# Patient Record
Sex: Female | Born: 1955
Health system: Southern US, Community
[De-identification: ages and names within clinical notes are randomized; demographics above are authoritative.]

## PROBLEM LIST (undated history)

## (undated) DIAGNOSIS — E079 Disorder of thyroid, unspecified: Secondary | ICD-10-CM

## (undated) DIAGNOSIS — I499 Cardiac arrhythmia, unspecified: Secondary | ICD-10-CM

## (undated) DIAGNOSIS — K219 Gastro-esophageal reflux disease without esophagitis: Secondary | ICD-10-CM

## (undated) DIAGNOSIS — M1990 Unspecified osteoarthritis, unspecified site: Secondary | ICD-10-CM

## (undated) DIAGNOSIS — Z5189 Encounter for other specified aftercare: Secondary | ICD-10-CM

## (undated) DIAGNOSIS — R42 Dizziness and giddiness: Secondary | ICD-10-CM

## (undated) DIAGNOSIS — K589 Irritable bowel syndrome without diarrhea: Secondary | ICD-10-CM

## (undated) DIAGNOSIS — M549 Dorsalgia, unspecified: Secondary | ICD-10-CM

## (undated) DIAGNOSIS — K559 Vascular disorder of intestine, unspecified: Secondary | ICD-10-CM

## (undated) DIAGNOSIS — I2699 Other pulmonary embolism without acute cor pulmonale: Secondary | ICD-10-CM

## (undated) DIAGNOSIS — E119 Type 2 diabetes mellitus without complications: Secondary | ICD-10-CM

## (undated) DIAGNOSIS — I10 Essential (primary) hypertension: Secondary | ICD-10-CM

## (undated) DIAGNOSIS — I82409 Acute embolism and thrombosis of unspecified deep veins of unspecified lower extremity: Secondary | ICD-10-CM

## (undated) DIAGNOSIS — R55 Syncope and collapse: Secondary | ICD-10-CM

## (undated) DIAGNOSIS — T7840XA Allergy, unspecified, initial encounter: Secondary | ICD-10-CM

## (undated) DIAGNOSIS — K648 Other hemorrhoids: Secondary | ICD-10-CM

## (undated) DIAGNOSIS — Z8619 Personal history of other infectious and parasitic diseases: Secondary | ICD-10-CM

## (undated) DIAGNOSIS — R51 Headache: Secondary | ICD-10-CM

## (undated) DIAGNOSIS — IMO0001 Reserved for inherently not codable concepts without codable children: Secondary | ICD-10-CM

## (undated) DIAGNOSIS — R519 Headache, unspecified: Secondary | ICD-10-CM

## (undated) HISTORY — DX: Other hemorrhoids: K64.8

## (undated) HISTORY — PX: JOINT REPLACEMENT: SHX530

## (undated) HISTORY — DX: Personal history of other infectious and parasitic diseases: Z86.19

## (undated) HISTORY — DX: Cardiac arrhythmia, unspecified: I49.9

## (undated) HISTORY — DX: Reserved for inherently not codable concepts without codable children: IMO0001

## (undated) HISTORY — DX: Allergy, unspecified, initial encounter: T78.40XA

## (undated) HISTORY — DX: Essential (primary) hypertension: I10

## (undated) HISTORY — DX: Other pulmonary embolism without acute cor pulmonale: I26.99

## (undated) HISTORY — DX: Syncope and collapse: R55

## (undated) HISTORY — DX: Vascular disorder of intestine, unspecified: K55.9

## (undated) HISTORY — DX: Gastro-esophageal reflux disease without esophagitis: K21.9

## (undated) HISTORY — PX: ROTATOR CUFF REPAIR: SHX139

## (undated) HISTORY — DX: Encounter for other specified aftercare: Z51.89

## (undated) HISTORY — PX: HYSTEROSCOPY: SHX211

## (undated) HISTORY — DX: Dorsalgia, unspecified: M54.9

## (undated) HISTORY — PX: TUBAL LIGATION: SHX77

## (undated) HISTORY — DX: Disorder of thyroid, unspecified: E07.9

---

## 2004-02-14 ENCOUNTER — Ambulatory Visit: Payer: Self-pay | Admitting: Specialist

## 2004-03-19 ENCOUNTER — Ambulatory Visit: Payer: Self-pay | Admitting: Specialist

## 2004-07-20 ENCOUNTER — Ambulatory Visit: Payer: Self-pay | Admitting: Unknown Physician Specialty

## 2004-08-02 ENCOUNTER — Ambulatory Visit: Payer: Self-pay | Admitting: General Surgery

## 2004-08-16 ENCOUNTER — Ambulatory Visit: Payer: Self-pay | Admitting: Family Medicine

## 2005-02-15 ENCOUNTER — Ambulatory Visit: Payer: Self-pay | Admitting: Family Medicine

## 2005-06-07 ENCOUNTER — Ambulatory Visit: Payer: Self-pay | Admitting: Family Medicine

## 2005-06-22 ENCOUNTER — Ambulatory Visit: Payer: Self-pay | Admitting: Family Medicine

## 2005-06-29 ENCOUNTER — Ambulatory Visit: Payer: Self-pay | Admitting: Family Medicine

## 2005-09-21 ENCOUNTER — Ambulatory Visit: Payer: Self-pay | Admitting: Unknown Physician Specialty

## 2005-09-21 ENCOUNTER — Ambulatory Visit: Payer: Self-pay | Admitting: Specialist

## 2005-12-26 ENCOUNTER — Ambulatory Visit: Payer: Self-pay | Admitting: Anesthesiology

## 2006-01-23 ENCOUNTER — Ambulatory Visit: Payer: Self-pay | Admitting: Anesthesiology

## 2006-02-22 ENCOUNTER — Ambulatory Visit: Payer: Self-pay | Admitting: Neurosurgery

## 2006-03-21 ENCOUNTER — Ambulatory Visit: Payer: Self-pay | Admitting: Neurosurgery

## 2006-03-23 ENCOUNTER — Ambulatory Visit: Payer: Self-pay | Admitting: Anesthesiology

## 2006-04-26 ENCOUNTER — Ambulatory Visit: Payer: Self-pay | Admitting: Anesthesiology

## 2006-05-08 ENCOUNTER — Encounter: Payer: Self-pay | Admitting: Anesthesiology

## 2006-05-19 ENCOUNTER — Encounter: Payer: Self-pay | Admitting: Anesthesiology

## 2006-06-08 ENCOUNTER — Ambulatory Visit: Payer: Self-pay | Admitting: Family Medicine

## 2006-06-08 LAB — CONVERTED CEMR LAB
ALT: 15 units/L (ref 0–40)
AST: 14 units/L (ref 0–37)
Albumin: 3.7 g/dL (ref 3.5–5.2)
Alkaline Phosphatase: 68 units/L (ref 39–117)
BUN: 14 mg/dL (ref 6–23)
Basophils Absolute: 0 10*3/uL (ref 0.0–0.1)
Basophils Relative: 0.4 % (ref 0.0–1.0)
Bilirubin, Direct: 0.1 mg/dL (ref 0.0–0.3)
CO2: 32 meq/L (ref 19–32)
Calcium: 9.1 mg/dL (ref 8.4–10.5)
Chloride: 103 meq/L (ref 96–112)
Creatinine, Ser: 0.8 mg/dL (ref 0.4–1.2)
Eosinophils Absolute: 0.1 10*3/uL (ref 0.0–0.6)
Eosinophils Relative: 0.8 % (ref 0.0–5.0)
GFR calc Af Amer: 97 mL/min
GFR calc non Af Amer: 80 mL/min
Glucose, Bld: 104 mg/dL — ABNORMAL HIGH (ref 70–99)
HCT: 41.1 % (ref 36.0–46.0)
Hemoglobin: 13.8 g/dL (ref 12.0–15.0)
Lymphocytes Relative: 24.7 % (ref 12.0–46.0)
MCHC: 33.5 g/dL (ref 30.0–36.0)
MCV: 84.9 fL (ref 78.0–100.0)
Magnesium: 2.2 mg/dL (ref 1.5–2.5)
Monocytes Absolute: 0.5 10*3/uL (ref 0.2–0.7)
Monocytes Relative: 6.6 % (ref 3.0–11.0)
Neutro Abs: 5 10*3/uL (ref 1.4–7.7)
Neutrophils Relative %: 67.5 % (ref 43.0–77.0)
Platelets: 228 10*3/uL (ref 150–400)
Potassium: 4.4 meq/L (ref 3.5–5.1)
RBC: 4.85 M/uL (ref 3.87–5.11)
RDW: 13.4 % (ref 11.5–14.6)
Sodium: 140 meq/L (ref 135–145)
TSH: 1.95 microintl units/mL (ref 0.35–5.50)
Total Bilirubin: 0.6 mg/dL (ref 0.3–1.2)
Total Protein: 6.4 g/dL (ref 6.0–8.3)
WBC: 7.5 10*3/uL (ref 4.5–10.5)

## 2006-06-12 ENCOUNTER — Encounter: Payer: Self-pay | Admitting: Family Medicine

## 2006-06-12 LAB — CONVERTED CEMR LAB
5-HIAA, 24 Hr Urine: 5.9 mg/(24.h) (ref <=6.0)
Dopamine 24 Hr Urine: 224 mcg/24hr (ref <500)
Metaneph Total, Ur: 530 ug/24hr — ABNORMAL HIGH (ref 95–475)
Metanephrines, Ur: 41 (ref 19–140)
Norepinephrine 24 Hr Urine: 54 mcg/24hr (ref <80)
Normetanephrine, 24H Ur: 489 — ABNORMAL HIGH (ref 52–310)

## 2006-06-13 ENCOUNTER — Ambulatory Visit: Payer: Self-pay | Admitting: Anesthesiology

## 2006-06-17 ENCOUNTER — Encounter: Payer: Self-pay | Admitting: Anesthesiology

## 2006-07-18 ENCOUNTER — Encounter: Payer: Self-pay | Admitting: Anesthesiology

## 2006-08-04 ENCOUNTER — Ambulatory Visit: Payer: Self-pay | Admitting: Specialist

## 2006-09-12 ENCOUNTER — Encounter: Payer: Self-pay | Admitting: Specialist

## 2006-09-17 ENCOUNTER — Encounter: Payer: Self-pay | Admitting: Specialist

## 2006-10-17 ENCOUNTER — Encounter: Payer: Self-pay | Admitting: Specialist

## 2006-10-25 ENCOUNTER — Ambulatory Visit: Payer: Self-pay | Admitting: Unknown Physician Specialty

## 2006-11-01 ENCOUNTER — Ambulatory Visit: Payer: Self-pay | Admitting: Unknown Physician Specialty

## 2006-11-17 ENCOUNTER — Encounter: Payer: Self-pay | Admitting: Specialist

## 2006-12-12 ENCOUNTER — Telehealth: Payer: Self-pay | Admitting: Family Medicine

## 2007-05-02 ENCOUNTER — Ambulatory Visit: Payer: Self-pay | Admitting: Family Medicine

## 2007-05-02 DIAGNOSIS — H5310 Unspecified subjective visual disturbances: Secondary | ICD-10-CM | POA: Insufficient documentation

## 2007-05-02 DIAGNOSIS — R609 Edema, unspecified: Secondary | ICD-10-CM | POA: Insufficient documentation

## 2007-08-13 ENCOUNTER — Telehealth: Payer: Self-pay | Admitting: Family Medicine

## 2007-08-20 ENCOUNTER — Ambulatory Visit: Payer: Self-pay | Admitting: Family Medicine

## 2007-08-28 ENCOUNTER — Telehealth: Payer: Self-pay | Admitting: Family Medicine

## 2008-01-16 ENCOUNTER — Ambulatory Visit: Payer: Self-pay | Admitting: Unknown Physician Specialty

## 2009-01-21 ENCOUNTER — Ambulatory Visit: Payer: Self-pay | Admitting: Unknown Physician Specialty

## 2009-04-22 ENCOUNTER — Ambulatory Visit: Payer: Self-pay | Admitting: Family Medicine

## 2009-04-29 ENCOUNTER — Ambulatory Visit: Payer: Self-pay | Admitting: Family Medicine

## 2009-04-29 ENCOUNTER — Encounter: Payer: Self-pay | Admitting: Internal Medicine

## 2009-11-19 ENCOUNTER — Encounter (INDEPENDENT_AMBULATORY_CARE_PROVIDER_SITE_OTHER): Payer: Self-pay | Admitting: *Deleted

## 2010-02-03 ENCOUNTER — Ambulatory Visit: Payer: Self-pay | Admitting: Unknown Physician Specialty

## 2010-02-04 ENCOUNTER — Ambulatory Visit: Payer: Self-pay | Admitting: Family Medicine

## 2010-02-04 DIAGNOSIS — I10 Essential (primary) hypertension: Secondary | ICD-10-CM | POA: Insufficient documentation

## 2010-02-22 ENCOUNTER — Ambulatory Visit: Payer: Self-pay | Admitting: Family Medicine

## 2010-02-22 LAB — CONVERTED CEMR LAB
ALT: 30 units/L (ref 0–35)
AST: 34 units/L (ref 0–37)
Albumin: 3.7 g/dL (ref 3.5–5.2)
Alkaline Phosphatase: 72 units/L (ref 39–117)
BUN: 10 mg/dL (ref 6–23)
Basophils Absolute: 0 10*3/uL (ref 0.0–0.1)
Basophils Relative: 0.5 % (ref 0.0–3.0)
Bilirubin, Direct: 0.1 mg/dL (ref 0.0–0.3)
CO2: 30 meq/L (ref 19–32)
Calcium: 8.9 mg/dL (ref 8.4–10.5)
Chloride: 104 meq/L (ref 96–112)
Creatinine, Ser: 0.8 mg/dL (ref 0.4–1.2)
Eosinophils Absolute: 0.1 10*3/uL (ref 0.0–0.7)
Eosinophils Relative: 1 % (ref 0.0–5.0)
GFR calc non Af Amer: 79.92 mL/min (ref >60)
Glucose, Bld: 99 mg/dL (ref 70–99)
HCT: 38.8 % (ref 36.0–46.0)
Hemoglobin: 13.3 g/dL (ref 12.0–15.0)
Lymphocytes Relative: 24.7 % (ref 12.0–46.0)
Lymphs Abs: 1.8 10*3/uL (ref 0.7–4.0)
MCHC: 34.3 g/dL (ref 30.0–36.0)
MCV: 83.8 fL (ref 78.0–100.0)
Monocytes Absolute: 0.5 10*3/uL (ref 0.1–1.0)
Monocytes Relative: 7.3 % (ref 3.0–12.0)
Neutro Abs: 4.8 10*3/uL (ref 1.4–7.7)
Neutrophils Relative %: 66.5 % (ref 43.0–77.0)
Platelets: 199 10*3/uL (ref 150.0–400.0)
Potassium: 3.8 meq/L (ref 3.5–5.1)
RBC: 4.63 M/uL (ref 3.87–5.11)
RDW: 13.8 % (ref 11.5–14.6)
Sodium: 142 meq/L (ref 135–145)
Total Bilirubin: 0.6 mg/dL (ref 0.3–1.2)
Total Protein: 6.3 g/dL (ref 6.0–8.3)
WBC: 7.3 10*3/uL (ref 4.5–10.5)

## 2010-03-09 ENCOUNTER — Telehealth: Payer: Self-pay | Admitting: Family Medicine

## 2010-03-10 ENCOUNTER — Ambulatory Visit: Payer: Self-pay | Admitting: Unknown Physician Specialty

## 2010-03-24 ENCOUNTER — Ambulatory Visit: Payer: Self-pay | Admitting: Family Medicine

## 2010-04-18 DIAGNOSIS — Z8619 Personal history of other infectious and parasitic diseases: Secondary | ICD-10-CM

## 2010-04-18 HISTORY — DX: Personal history of other infectious and parasitic diseases: Z86.19

## 2010-05-18 NOTE — Assessment & Plan Note (Signed)
Summary: 6 WEEK FOLLOW UP FOR BP CHECK/RBH   Vital Signs:  Patient profile:   55 year old female Weight:      285.75 pounds Temp:     97.6 degrees F oral Pulse rate:   84 / minute Pulse rhythm:   regular BP sitting:   122 / 78  (left arm) Cuff size:   large  Vitals Entered By: Sydell Axon LPN (March 24, 2010 10:10 AM) CC: Follow-up visit on BP   History of Present Illness: Here for BP check, doing well. She has it checked at the office every other day or so and it has been good.  Her gastro from last office visit is gone and her back pain from her phone call is also gone. She feels well and has no complaints today.  Problems Prior to Update: 1)  Gastroenteritis, Acute  (ICD-558.9) 2)  Essential Hypertension  (ICD-401.9) 3)  Dizziness  (ICD-780.4) 4)  Unspecified Subjective Visual Disturbance  (ICD-368.10) 5)  Edema  (ICD-782.3)  Medications Prior to Update: 1)  Benadryl 25 Mg  Caps (Diphenhydramine Hcl) .[name].. 1 As Needed Allergies 2)  Prilosec Otc 20 Mg  Tbec (Omeprazole Magnesium) .[name].. 1 Daily As Needed 3)  Etodolac 400 Mg Tabs (Etodolac) .... One Tab By Mouth Two Times A Day With Food As Needed For Back Pain. Use Sparingly. 4)  Multivitamins  Tabs (Multiple Vitamin) .... Take One By Mouth Daily 5)  Biotin 300 Mcg Tabs (Biotin) .... Take One By Mouth Daily 6)  Meclizine Hcl 25 Mg Tabs (Meclizine Hcl) .... One Tab By Mouth Every 6 Hrs As Needed Dizziness. 7)  Metoprolol Succinate 25 Mg Xr24h-Tab (Metoprolol Succinate) .... 1/2 Tab By Mouth Once Daily 8)  Cyclobenzaprine Hcl 10 Mg Tabs (Cyclobenzaprine Hcl) .... 1/2 - 1 Tab By Mouth Three Times A Day  As Needed For Back Pain. 9)  Hydrocodone-Acetaminophen 5-500 Mg Tabs (Hydrocodone-Acetaminophen) .... One Tab By Mouth Two Times A Day As Needed Back Pain  Current Medications (verified): 1)  Benadryl 25 Mg  Caps (Diphenhydramine Hcl) .[name].. 1 As Needed Allergies 2)  Prilosec Otc 20 Mg  Tbec (Omeprazole Magnesium) .[name].. 1 Daily  As Needed 3)  Etodolac 400 Mg Tabs (Etodolac) .... One Tab By Mouth Two Times A Day With Food As Needed For Back Pain. Use Sparingly. 4)  Multivitamins  Tabs (Multiple Vitamin) .... Take One By Mouth Daily 5)  Biotin 300 Mcg Tabs (Biotin) .... Take One By Mouth Daily 6)  Meclizine Hcl 25 Mg Tabs (Meclizine Hcl) .... One Tab By Mouth Every 6 Hrs As Needed Dizziness. 7)  Metoprolol Succinate 25 Mg Xr24h-Tab (Metoprolol Succinate) .... 1/2 Tab By Mouth Once Daily 8)  Cyclobenzaprine Hcl 10 Mg Tabs (Cyclobenzaprine Hcl) .... 1/2 - 1 Tab By Mouth Three Times A Day  As Needed For Back Pain. 9)  Hydrocodone-Acetaminophen 5-500 Mg Tabs (Hydrocodone-Acetaminophen) .... One Tab By Mouth Two Times A Day As Needed Back Pain  Allergies: 1)  ! Codeine Sulfate (Codeine Sulfate)  Physical Exam  General:  Well-developed,well-nourished,in no acute distress; alert,appropriate and cooperative throughout examination Head:  Normocephalic and atraumatic without obvious abnormalities. No apparent alopecia or balding. Sinuses nontender. Eyes:  Conjunctiva clear bilaterally.  Ears:  External ear exam shows no significant lesions or deformities.  Otoscopic examination reveals clear canals, tympanic membranes are intact bilaterally without bulging, retraction, inflammation or discharge. Hearing is grossly normal bilaterally.  Nose:  External nasal examination shows no deformity or inflammation. Nasal mucosa  are pink and moist without lesions or exudates. Mouth:  Oral mucosa and oropharynx without lesions or exudates.  Teeth in good repair. Neck:  No deformities, masses, or tenderness noted. Chest Wall:  No deformities, masses, or tenderness noted. Lungs:  Normal respiratory effort, chest expands symmetrically. Lungs are clear to auscultation, no crackles or wheezes.  Heart:  Normal rate and regular rhythm. S1 and S2 normal without gallop, murmur, click, rub or other extra sounds.   Impression &  Recommendations:  Problem # 1:  ESSENTIAL HYPERTENSION (ICD-401.9) Assessment Improved  Good results...pulse in the 70s by the time I got to examine her. Cont curr meds. Her updated medication list for this problem includes:    Metoprolol Succinate 25 Mg Xr24h-tab (Metoprolol succinate) .[name]... 1/2 tab by mouth once daily  BP today: 122/78 Prior BP: 112/76 (02/22/2010)  Labs Reviewed: K+: 3.8 (02/22/2010) Creat: : 0.8 (02/22/2010)     Problem # 2:  GASTROENTERITIS, ACUTE (ICD-558.9) Assessment: Improved Resolved. Her updated medication list for this problem includes:    Meclizine Hcl 25 Mg Tabs (Meclizine hcl) ..... One tab by mouth every 6 hrs as needed dizziness.  Problem # 3:  DIZZINESS (ICD-780.4) Assessment: Improved Essentiallly resolved. Her updated medication list for this problem includes:    Benadryl 25 Mg Caps (Diphenhydramine hcl) .[name]... 1 as needed allergies    Meclizine Hcl 25 Mg Tabs (Meclizine hcl) ..... One tab by mouth every 6 hrs as needed dizziness.  Complete Medication List: 1)  Benadryl 25 Mg Caps (Diphenhydramine hcl) .[name].. 1 as needed allergies 2)  Prilosec Otc 20 Mg Tbec (Omeprazole magnesium) .[name].. 1 daily as needed 3)  Etodolac 400 Mg Tabs (Etodolac) .... One tab by mouth two times a day with food as needed for back pain. use sparingly. 4)  Multivitamins Tabs (Multiple vitamin) .... Take one by mouth daily 5)  Biotin 300 Mcg Tabs (Biotin) .... Take one by mouth daily 6)  Meclizine Hcl 25 Mg Tabs (Meclizine hcl) .... One tab by mouth every 6 hrs as needed dizziness. 7)  Metoprolol Succinate 25 Mg Xr24h-tab (Metoprolol succinate) .... 1/2 tab by mouth once daily 8)  Cyclobenzaprine Hcl 10 Mg Tabs (Cyclobenzaprine hcl) .... 1/2 - 1 tab by mouth three times a day  as needed for back pain. 9)  Hydrocodone-acetaminophen 5-500 Mg Tabs (Hydrocodone-acetaminophen) .... One tab by mouth two times a day as needed back pain  Patient Instructions: 1)  RTC as  needed.   Orders Added: 1)  Est. Patient Level III [02725]    Current Allergies (reviewed today): ! CODEINE SULFATE (CODEINE SULFATE)

## 2010-05-18 NOTE — Progress Notes (Signed)
Summary: ? Thrush  Phone Note Call from Patient Call back at [phone]   Caller: Patient Call For: Dr. Hetty Ely Summary of Call: Pt was given an antibiotic for Augmentin and has developed a yeast infection and her GYN called her in something for that.  Pt now thinks that she has thrush, strange taste in her mouth, mouth is super dry, mouth feels thick, scratchy.  Can you call something in for her?  [facility] [id number] Initial call taken by: Sydell Axon,  Aug 28, 2007 4:25 PM  Follow-up for Phone Call        Please call in Mycostatin Susp 4-6 ml swish as long as poss and spit 4 times daily  16oz/0RF Follow-up by: Shaune Leeks MD,  Aug 28, 2007 5:03 PM  Additional Follow-up for Phone Call Additional follow up Details #1::        PATIENT NOTIFIED AND [facility]CRIBTION [facility]LLED [facility] Additional Follow-up by: Providence Crosby,  Aug 28, 2007 5:09 PM    New/Updated Medications: NYSTATIN 100000 UNIT/ML  SUSP (NYSTATIN)

## 2010-05-18 NOTE — Assessment & Plan Note (Signed)
Summary: OV PER DR SCHALLER/HEA   Vital Signs:  Patient Profile:   55 Years Old Female Weight:      281 pounds Temp:     97.6 degrees F oral Pulse rate:   76 / minute Pulse rhythm:   regular BP sitting:   140 / 80  (left arm) Cuff size:   large  Vitals Entered By: Providence Crosby (Aug 20, 2007 3:21 PM)                 Chief Complaint:  FOLLOW UP PER DR Hetty Ely.  History of Present Illness: Here for f/u sickness in [place]. HAs lingering cough, head congestion, ears hurting with blowing nose, coming down in A/c on both flights Sat were uncomfortable. No known fever, but sweats fairly frequently. Stopped everything Sat after getting home. Ears still congested with dull hearing.    Prior Medications Reviewed Using: Patient Recall  Current Allergies: No known allergies       Physical Exam  General:     Well-developed,well-nourished,in no acute distress; alert,appropriate and cooperative throughout examination Head:     Normocephalic and atraumatic without obvious abnormalities. No apparent alopecia or balding. Sinuses nontender. Eyes:     min inflamed palpebral conjunctiva. Ears:     External ear exam shows no significant lesions or deformities.  Otoscopic examination reveals clear canals, tympanic membranes are intact bilaterally without bulging, retraction, inflammation or discharge. Hearing is grossly normal bilaterally. Poor mobiliy but nml LR Nose:     Mildly inflamed. Mouth:     Oral mucosa and oropharynx without lesions or exudates.  Teeth in good repair. Neck:     No deformities, masses, or tenderness noted. Lungs:     Normal respiratory effort, chest expands symmetrically. Lungs are clear to auscultation, no crackles or wheezes. Throaty wet cough with rattling. Heart:     Normal rate and regular rhythm. S1 and S2 normal without gallop, murmur, click, rub or other extra sounds.    Impression & Recommendations:  Problem # 1:  BRONCHITIS-ACUTE  (ICD-466.0) Assessment: New Worsened by flying. Her updated medication list for this problem includes:    Augmentin 500-125 Mg Tabs (Amoxicillin-pot clavulanate) ..... One tab by mouth three times a day    Tussionex Pennkinetic Er 8-10 Mg/6ml Lqcr (Chlorpheniramine-hydrocodone) ..... One tsp by mouth hs as needed cough    Tessalon 200 Mg Caps (Benzonatate) ..... One tab by mouth three times a day as needed  Take antibiotics and other medications as directed. Encouraged to push clear liquids, get enough rest, and take acetaminophen as needed. To be seen in 5-7 days if no improvement, sooner if worse.   Complete Medication List: 1)  Benadryl 25 Mg Caps (Diphenhydramine hcl) .[name].. 1 as needed allergies 2)  Prilosec Otc 20 Mg Tbec (Omeprazole magnesium) .[name].. 1 daily as needed 3)  Augmentin 500-125 Mg Tabs (Amoxicillin-pot clavulanate) .... One tab by mouth three times a day 4)  Tussionex Pennkinetic Er 8-10 Mg/59ml Lqcr (Chlorpheniramine-hydrocodone) .... One tsp by mouth hs as needed cough 5)  Tessalon 200 Mg Caps (Benzonatate) .... One tab by mouth three times a day as needed   Patient Instructions: 1)  RTC or call if sxs don't improve.   Prescriptions: TESSALON 200 MG  CAPS (BENZONATATE) one tab by mouth three times a day as needed  #30 x 2   Entered and Authorized by:   Shaune Leeks MD   Signed by:   Shaune Leeks MD on 08/20/2007  Method used:   Print then Give to Patient   RxID:   [id number] Sandria Senter ER 8-10 MG/5ML  LQCR (CHLORPHENIRAMINE-HYDROCODONE) one tsp by mouth HS as needed cough  #8 oz x 0   Entered and Authorized by:   Shaune Leeks MD   Signed by:   Shaune Leeks MD on 08/20/2007   Method used:   Print then Give to Patient   RxID:   [id number] AUGMENTIN 500-125 MG  TABS (AMOXICILLIN-POT CLAVULANATE) one tab by mouth three times a day  #30 x 1   Entered and Authorized by:   Shaune Leeks MD   Signed by:    Shaune Leeks MD on 08/20/2007   Method used:   Print then Give to Patient   RxID:   [id number]  ]

## 2010-05-18 NOTE — Letter (Signed)
Summary: Nadara Eaton letter  [facility]  [address] [place], [place] [address]   Phone: [phone]  Fax: [phone]       11/19/2009 MRN: [id number]  [facility] [address] [place], [place]  [address]  Dear Ms. [facility] [place] and [place] announce the retirement of Arta Silence, M.D., from full-time practice at the [facility] office effective October 15, 2009 and his plans of returning part-time.  It is important to Dr. Hetty Ely and to our practice that you understand that [facility] has seven physicians in our office for your health care needs.  We will continue to offer the same exceptional care that you have today.    Dr. Hetty Ely has spoken to many of you about his plans for retirement and returning part-time in the fall.   We will continue to work with you through the transition to schedule appointments for you in the office and meet the high standards that [facility] is committed to.   Again, it is with great pleasure that we share the news that Dr. Hetty Ely will return to [facility] in October of 2011 with a reduced schedule.    If you have any questions, or would like to request an appointment with one of our physicians, please call us at [phone] and press the option for Scheduling an appointment.  We take pleasure in providing you with excellent patient care and look forward to seeing you at your next office visit.  Our [facility] Physicians are:  Tillman Abide, M.D. Laurita Quint, M.D. Roxy Manns, M.D. Kerby Nora, M.D. Hannah Beat, M.D. Ruthe Mannan, M.D. We proudly welcomed Raechel Ache, M.D. and Eustaquio Boyden, M.D. to the practice in July/August 2011.  Sincerely,  [facility]

## 2010-05-18 NOTE — Progress Notes (Signed)
Summary: Pt needs to discuss if she needs an antibiotic  Phone Note Call from Patient Call back at [phone]   Caller: Patient Summary of Call: Pt informed me that she is "Dr" Carew's wife. She is in [facility] and wants to discuss w/you whether or not she needs an antibiotic. Please call her cell. Initial call taken by: Mickle Asper,  August 13, 2007 3:09 PM  Follow-up for Phone Call        Spoke with her and discussed situation. Call if develops fever or chills. Follow-up by: Shaune Leeks MD,  August 13, 2007 6:58 PM

## 2010-05-18 NOTE — Assessment & Plan Note (Signed)
Summary: DISCUSS MIGRAINES AND VISUAL CHGS/CE   Vital Signs:  Patient Profile:   55 Years Old Female Weight:      276 pounds Pulse rate:   76 / minute Pulse rhythm:   regular BP sitting:   130 / 80  (left arm) Cuff size:   large  Vitals Entered By: Tiffany Wells (May 02, 2007 3:55 PM)                 Chief Complaint:  DISCUSS MIGRAINES.  History of Present Illness: Mother just died from glioblastoma after a valiant 2+ year fight.Marland KitchenMarland KitchenMrs Wells was driving back and forth to [place] with mild swelling of the feet and ankles which would resolve after a bit of time but on her last trip when her Mother died two days after her drive up, her toes were like sausages, her ankles were unrecognizable and her lower legs were swollen, which have now finally resolved but took weeks to get better. She did not have any shortness of breath or chest pain asssociated, nor did she wheeze. Her left leg has always been worse than her rigt, with problems with pain and attention by ortho for achillees problems of the left side.  During the same time frame, she also had starlike visual changes like starbursts, again left worse than right sided without headache until the last episide, which was  related to a mild left-sided headache. An almost by the way statement at the end of this discussion also related that she is having some "waviness" of her left eye or "waviness" of the vision of  her left eye. She also continues to look for something to help her with weight loss knowing she is very definitely an emotional eater.  Current Allergies: No known allergies       Physical Exam  General:     Well-developed,well-nourished,in no acute distress; alert,appropriate and cooperative throughout examination Head:     Normocephalic and atraumatic without obvious abnormalities. No apparent alopecia or balding. Eyes:     Conjunctiva clear bilaterally.  Ears:     External ear exam shows no significant lesions  or deformities.  Otoscopic examination reveals clear canals, tympanic membranes are intact bilaterally without bulging, retraction, inflammation or discharge. Hearing is grossly normal bilaterally. Nose:     External nasal examination shows no deformity or inflammation. Nasal mucosa are pink and moist without lesions or exudates. Mouth:     Oral mucosa and oropharynx without lesions or exudates.  Teeth in good repair. Neck:     No deformities, masses, or tenderness noted. Chest Wall:     No deformities, masses, or tenderness noted. Lungs:     Normal respiratory effort, chest expands symmetrically. Lungs are clear to auscultation, no crackles or wheezes. Good airflow. Heart:     Normal rate and regular rhythm. S1 and S2 normal without gallop, murmur, click, rub or other extra sounds. Msk:     Difficulty getting up and down on the exam table due to left leg and ankle discomfort. Extremities:     no overt edema noted today.    Impression & Recommendations:  Problem # 1:  EDEMA (ICD-782.3) Long discussion of vascular forces and pressures keeping fluid in the vessels and not in the subcutaneous tissues. Will try to hold off diuretic therapy and CHF workup, but may need to do both in the future. Elevate, avoid salt, walk as much as possible, and if can stand it use support hose.  Problem # 2:  UNSPECIFIED SUBJECTIVE VISUAL DISTURBANCE (ICD-368.10) Assessment: Unchanged Could be migraines as she has had in the past but could also be intrinsic ocular muscle problems...suggest she see an opthalmologist for eval.  Complete Medication List: 1)  Benadryl 25 Mg Caps (Diphenhydramine hcl) .[name].. 1 as needed allergies 2)  Prilosec Otc 20 Mg Tbec (Omeprazole magnesium) .[name].. 1 daily as needed   Patient Instructions: 1)  Discussed all in detail. RTC if either continues without improvement or worsens. 2)  Call if develops SOB or wheezing.    ]

## 2010-05-18 NOTE — Assessment & Plan Note (Signed)
Summary: DIZZY SPELLS, DID NOT WANT TO COME ANY SOONER.Marland KitchenCYD   Vital Signs:  Patient profile:   55 year old female Height:      68 inches Weight:      254 pounds BMI:     38.76 Temp:     98.4 degrees F oral Pulse rate:   84 / minute Pulse rhythm:   regular BP supine:   140 / 94  (left arm) BP sitting:   140 / 90  (left arm) BP standing:   130 / 90  (left arm) Cuff size:   large  Vitals Entered By: Sydell Axon LPN (April 22, 2009 10:11 AM) CC: Dizzy spells off and on for several months   History of Present Illness: Pt here for dizzy spells which started middle of last month, 12/12 at a Christmas Season cocktail party. She was getting ready to leave after having no alcohol, leaning over to get her coat off the bed where the coats had been kept, and experienced a queasiness, almost like she was going to pass out. Feeling nauseated and not wanting to make too much of it, she got outside and immediately felt better. She had been on her feet for most or all of the two hours prior. She then had an occasional similar episode which became more common and now is happening daily, a feeling of leaning or positional unsteadiness. Her mother was a diabetic so she has been watching her sweet intake without much improvement. She continues to feel "off center". She has not noticed dizziness or spinning overtly and also has not had symptoms with rolling over in bed. She had some visual disturbances this time last year that could have been migraine related. She had been travelling to [place] regularly fre4quently until her mother's death from Glioblastoma, a stressful time. Those sxs have fairly well abated since then. She has been trying to lose weight again, has been on a 1500 cal diet since 9/1 and her weight 08/20/07 here was 281...today 254! Her BP last time here in Sep was 140/80, normal prior to that, and today nonorthostatic but 140/90-94, all in the left arm.  Problems Prior to Update: 1)  Unspecified  Subjective Visual Disturbance  (ICD-368.10) 2)  Edema  (ICD-782.3)  Medications Prior to Update: 1)  Benadryl 25 Mg  Caps (Diphenhydramine Hcl) .[name].. 1 As Needed Allergies 2)  Prilosec Otc 20 Mg  Tbec (Omeprazole Magnesium) .[name].. 1 Daily As Needed 3)  Augmentin 500-125 Mg  Tabs (Amoxicillin-Pot Clavulanate) .... One Tab By Mouth Three Times A Day 4)  Tussionex Pennkinetic Er 8-10 Mg/24ml  Lqcr (Chlorpheniramine-Hydrocodone) .... One Tsp By Mouth Hs As Needed Cough 5)  Tessalon 200 Mg  Caps (Benzonatate) .... One Tab By Mouth Three Times A Day As Needed 6)  Nystatin 100000 Unit/ml  Susp (Nystatin) 7)  Tamiflu 75 Mg Caps (Oseltamivir Phosphate) .... One Tab By Mouth Once Daily  Allergies: No Known Drug Allergies  Physical Exam  General:  Well-developed,well-nourished,in no acute distress; alert,appropriate and cooperative throughout examination, thinner and looks stable. Head:  Normocephalic and atraumatic without obvious abnormalities. No apparent alopecia or balding. Sinuses nontender. Eyes:  Conjunctiva clear bilaterally.  Ears:  External ear exam shows no significant lesions or deformities.  Otoscopic examination reveals clear canals, tympanic membranes are intact bilaterally without bulging, retraction, inflammation or discharge. Hearing is grossly normal bilaterally.  Nose:  External nasal examination shows no deformity or inflammation. Nasal mucosa are pink and moist without lesions or  exudates. Mouth:  Oral mucosa and oropharynx without lesions or exudates.  Teeth in good repair. Neck:  No deformities, masses, or tenderness noted. Chest Wall:  No deformities, masses, or tenderness noted. Lungs:  Normal respiratory effort, chest expands symmetrically. Lungs are clear to auscultation, no crackles or wheezes.  Heart:  Normal rate and regular rhythm. S1 and S2 normal without gallop, murmur, click, rub or other extra sounds. Extremities:  No clubbing, cyanosis, edema, or deformity noted  with normal full range of motion of all joints.   Neurologic:  No cranial nerve deficits noted. Station and gait are normal.  Sensory, motor and coordinative functions appear intact. HTS, FTN, RAM, Heel and Toe walk, Tandem Gait and Romberg all nml.   Impression & Recommendations:  Problem # 1:  DIZZINESS (ICD-780.4) Assessment New Due to mother's history of Glioblastoma and nonspecific sxs accelerating, will get MRI of head with contrast. Her updated medication list for this problem includes:    Benadryl 25 Mg Caps (Diphenhydramine hcl) .[name]... 1 as needed allergies    Meclizine Hcl 25 Mg Tabs (Meclizine hcl) ..... One tab by mouth every 6 hrs as needed dizziness.  Orders: Radiology Referral (Radiology)  Complete Medication List: 1)  Benadryl 25 Mg Caps (Diphenhydramine hcl) .[name].. 1 as needed allergies 2)  Prilosec Otc 20 Mg Tbec (Omeprazole magnesium) .[name].. 1 daily as needed 3)  Nystatin 100000 Unit/ml Susp (Nystatin) 4)  Lodeine 500 Mg  .... Take one by mouth two times a day 5)  Multivitamins Tabs (Multiple vitamin) .... Take one by mouth daily 6)  Vitamin B-12 2500 Mcg Subl (Cyanocobalamin) .... Take one by mouth daily 7)  Biotin 300 Mcg Tabs (Biotin) .... Take one by mouth daily 8)  Meclizine Hcl 25 Mg Tabs (Meclizine hcl) .... One tab by mouth every 6 hrs as needed dizziness.  Patient Instructions: 1)  Refer for Head MRI. If nml, will get blood work (Bmet, LFTs, Chol Prof, CBC, TSH, Vit D) and see back for reassessment of BP. 2)  spent with pt. Prescriptions: MECLIZINE HCL 25 MG TABS (MECLIZINE HCL) one tab by mouth every 6 hrs as needed dizziness.  #30 x 12   Entered and Authorized by:   Shaune Leeks MD   Signed by:   Shaune Leeks MD on 04/22/2009   Method used:   [facility]        [id number]       [address]       [place], [place]  [address]       Ph: [phone]       Fax: [phone]   RxID:    [id number]   Current Allergies (reviewed today): No known allergies

## 2010-05-18 NOTE — Assessment & Plan Note (Signed)
Summary: ABD PAIN   Vital Signs:  Patient profile:   55 year old female Height:      68 inches Weight:      280.25 pounds BMI:     42.77 Temp:     97.9 degrees F oral Pulse rate:   84 / minute Pulse rhythm:   regular BP sitting:   112 / 76  (left arm) Cuff size:   large  Vitals Entered By: Delilah Shan CMA Duncan Dull) (February 22, 2010 1:55 PM) CC: abdominal pain   History of Present Illness: In [place] recently- Thursday had abdominal pain, lasted for several hours.  Watery diarrhea and vomiting.  Was passing blood with BMs.  Was passing BRBPR and clear mucous.  It tapered off and she felt some better on Saturday.  Was taking donnatal and phenergan over the weekend.  Last BRBPR was 6pm saturday evening.  Still with some cramping but much improved.  No fevers.  Dec in appetite overall.  Tolerating bland diet.  No known fevers.  3 BMs today but w/o blood.  Ate on the road to [place] on Thursday.    H/o IBS since age 64, but no symptoms like this previously.  No travel outside of [place].  No well water.  No other sick contacts.   prev colonoscopy at 48, w/o sig abnormality.    Allergies: 1)  ! Codeine Sulfate (Codeine Sulfate)  Review of Systems       See HPI.  Otherwise negative.    Physical Exam  General:  GEN: nad, alert and oriented HEENT: mucous membranes moist NECK: supple w/o LA CV: rrr PULM: ctab, no inc wob ABD: soft, +bs, not tender to palpation, no rebound EXT: no edema SKIN: no acute rash    Impression & Recommendations:  Problem # 1:  GASTROENTERITIS, ACUTE (ICD-558.9) Likely self resolving infection, either bacterial or viral.  Nontoxic and labs wnl.  Improving.  follow up as needed.  I think this will resolve w/o return of symptoms.  Consider GI follow up if symptoms persist/return.  She agrees with plan.  Her updated medication list for this problem includes:    Meclizine Hcl 25 Mg Tabs (Meclizine hcl) ..... One tab by mouth every 6 hrs as needed  dizziness.  Orders: TLB-BMP (Basic Metabolic Panel-BMET) (80048-METABOL) TLB-CBC Platelet - w/Differential (85025-CBCD) TLB-Hepatic/Liver Function Pnl (80076-HEPATIC)  Complete Medication List: 1)  Benadryl 25 Mg Caps (Diphenhydramine hcl) .[name].. 1 as needed allergies 2)  Prilosec Otc 20 Mg Tbec (Omeprazole magnesium) .[name].. 1 daily as needed 3)  Lodeine 500 Mg  .... Take one by mouth two times a day 4)  Multivitamins Tabs (Multiple vitamin) .... Take one by mouth daily 5)  Biotin 300 Mcg Tabs (Biotin) .... Take one by mouth daily 6)  Meclizine Hcl 25 Mg Tabs (Meclizine hcl) .... One tab by mouth every 6 hrs as needed dizziness. 7)  Metoprolol Succinate 25 Mg Xr24h-tab (Metoprolol succinate) .... 1/2 tab by mouth once daily  Patient Instructions: 1)  You can get your results through our phone system.  Follow the instructions on the blue card.  Let me know if your symptoms don't gradually resolve.  I expect that they will.  Take care.  Glad to see you today.    Orders Added: 1)  Est. Patient Level III [56433] 2)  TLB-BMP (Basic Metabolic Panel-BMET) [80048-METABOL] 3)  TLB-CBC Platelet - w/Differential [85025-CBCD] 4)  TLB-Hepatic/Liver Function Pnl [80076-HEPATIC]

## 2010-05-18 NOTE — Assessment & Plan Note (Signed)
Summary: CHECK BP/CLE   Vital Signs:  Patient profile:   55 year old female Weight:      281.8 pounds BMI:     43.00 Temp:     97.2 degrees F oral Pulse rate:   84 / minute Pulse rhythm:   regular BP sitting:   154 / 100  (right arm) Cuff size:   large  Vitals Entered By: Sydell Axon LPN (February 04, 2010 10:35 AM) CC: Follow-up on BP, Abdominal Pain   History of Present Illness: Pt here for elevated BP with lots of stress at the office. Signif FH of Htn  Mom, Dad, Sister. No headaches, no other acute proiblems. Has joint aches and takes Lodine.  Discussed poss of Lodine at least worsening.   Lifestyle issue that she needs to take. Only med she tolerates that works.  Allergies (verified): 1)  ! Codeine Sulfate (Codeine Sulfate)  Family History: Father dec 85 Myloma HTN CAD MVR Mother dec 85 Glioma HTN DM Brother A 47 Precancerous lip lesion Sister A 60 HTN Arthritis  Social History: Occupation: Personal assistant Married Lives w/ Husband Occupation:  employed  Physical Exam  General:  Well-developed,well-nourished,in no acute distress; alert,appropriate and cooperative throughout examination, thinner and looks stable. Head:  Normocephalic and atraumatic without obvious abnormalities. No apparent alopecia or balding. Sinuses nontender. Eyes:  Conjunctiva clear bilaterally.  Lungs:  Normal respiratory effort, chest expands symmetrically. Lungs are clear to auscultation, no crackles or wheezes.  Heart:  Normal rate and regular rhythm. S1 and S2 normal without gallop, murmur, click, rub or other extra sounds.   Impression & Recommendations:  Problem # 1:  ESSENTIAL HYPERTENSION (ICD-401.9) Assessment New Mom had problems with lots of BPO meds and was able to take Metoprolol. Will start on low dose. Take at night. Recheck 6 weeks. Discussed fatigue. Her updated medication list for this problem includes:    Metoprolol Succinate 25 Mg Xr24h-tab (Metoprolol  succinate) .[name]... 1/2 tab by mouth once daily  Complete Medication List: 1)  Benadryl 25 Mg Caps (Diphenhydramine hcl) .[name].. 1 as needed allergies 2)  Prilosec Otc 20 Mg Tbec (Omeprazole magnesium) .[name].. 1 daily as needed 3)  Lodeine 500 Mg  .... Take one by mouth two times a day 4)  Multivitamins Tabs (Multiple vitamin) .... Take one by mouth daily 5)  Biotin 300 Mcg Tabs (Biotin) .... Take one by mouth daily 6)  Meclizine Hcl 25 Mg Tabs (Meclizine hcl) .... One tab by mouth every 6 hrs as needed dizziness. 7)  Metoprolol Succinate 25 Mg Xr24h-tab (Metoprolol succinate) .... 1/2 tab by mouth once daily   Patient Instructions: 1)  RTC 6 weeks for BP check Prescriptions: METOPROLOL SUCCINATE 25 MG XR24H-TAB (METOPROLOL SUCCINATE) 1/2 tab by mouth once daily  #30 x 12   Entered and Authorized by:   Shaune Leeks MD   Signed by:   Shaune Leeks MD on 02/04/2010   Method used:   [facility]        [id number]       [address]       [place], [place]  [address]       Ph: [phone]       Fax: [phone]   RxID:   [id number]    Orders Added: 1)  Est. Patient Level III [27253]    Current Allergies (reviewed today): ! CODEINE SULFATE (CODEINE SULFATE)  Appended Document: CHECK BP/CLE BPs in office  168/110  162/94  140/94  158/86  134/82  136/90 

## 2010-05-18 NOTE — Progress Notes (Signed)
Summary: Back pain  Phone Note Call from Patient Call back at Work Phone [phone] Call back at [phone]   Caller: Patient Call For: Shaune Leeks MD Summary of Call: Patient says she has hurt her back today she think it is muscle related and says that this has happened before and you have called medication in for her with out seeing her. Can u do this again. Please advised  Patient is aware that Dr. Hetty Ely is out today. Initial call taken by: Benny Lennert CMA Duncan Dull),  March 09, 2010 3:42 PM  Follow-up for Phone Call        Use Etodolac as needed sparingly with food and use Flexeril as tolerated for muscle spasm. Be careful of dizziness with the flexeril. Sent to [facility]. Follow-up by: Shaune Leeks MD,  March 10, 2010 7:28 AM  Additional Follow-up for Phone Call Additional follow up Details #1::        Patient notified as instructed by telephone. Was informed by patient that she already takes Etodolac daily for her joint aches. Sydell Axon LPN  March 10, 2010 8:42  Spoke with pt. Will call in some Vicodin as well. She is already taking Lodine two times a day. Discussed heat and ice. Additional Follow-up by: Shaune Leeks MD,  March 10, 2010 10:09 AM    Additional Follow-up for Phone Call Additional follow up Details #2::    Rx called to pharmacy. Follow-up by: Sydell Axon LPN,  March 10, 2010 10:57 AM  New/Updated Medications: ETODOLAC 400 MG TABS (ETODOLAC) one tab by mouth two times a day with food as needed for back pain. Use sparingly. CYCLOBENZAPRINE HCL 10 MG TABS (CYCLOBENZAPRINE HCL) 1/2 - 1 tab by mouth three times a day  as needed for back pain. HYDROCODONE-ACETAMINOPHEN 5-500 MG TABS (HYDROCODONE-ACETAMINOPHEN) one tab by mouth two times a day as needed back pain Prescriptions: HYDROCODONE-ACETAMINOPHEN 5-500 MG TABS (HYDROCODONE-ACETAMINOPHEN) one tab by mouth two times a day as needed back pain  #30 x 0   Entered and  Authorized by:   Shaune Leeks MD   Signed by:   Sydell Axon LPN on 19/14/7829   Method used:   [facility] ...       [facility]  [address] [id number]       [address]       [place], [place]  [address]       Ph: [phone]       Fax: [phone]   RxID:   [id number] CYCLOBENZAPRINE HCL 10 MG TABS (CYCLOBENZAPRINE HCL) 1/2 - 1 tab by mouth three times a day  as needed for back pain.  #60 x 1   Entered and Authorized by:   Shaune Leeks MD   Signed by:   Shaune Leeks MD on 03/10/2010   Method used:   Electronically to        [id number]       [address]       [place], [place]  [address]       Ph: [phone]       Fax: [phone]   RxID:   [id number] ETODOLAC 400 MG TABS (ETODOLAC) one tab by mouth two times a day with food as needed for back pain. Use sparingly.  #60 x 1   Entered and Authorized by:   Shaune Leeks MD   Signed by:   Shaune Leeks MD on 03/10/2010   Method used:  Electronically to        [id number]       [address]       [place], [place]  [address]       Ph: [phone]       Fax: [phone]   RxID:   [id number]

## 2010-05-27 ENCOUNTER — Telehealth: Payer: Self-pay | Admitting: Family Medicine

## 2010-06-03 NOTE — Progress Notes (Signed)
Summary: pt requests tussionex  Phone Note Call from Patient Call back at [phone]   Caller: Patient Call For: Shaune Leeks MD Summary of Call: Pt states she has a croupy cough and is asking if tussionex can be called to [facility] so that she can take it at night and sleep.   She is taking mucous relief expectorant.  I advised her that she might need to be seen first.  Initial call taken by: Lowella Petties CMA, AAMA,  May 27, 2010 2:34 PM  Follow-up for Phone Call        Called to pharmacy, advised pt. Follow-up by: Lowella Petties CMA, AAMA,  May 27, 2010 2:54 PM    New/Updated Medications: Sandria Senter ER 10-8 MG/5ML LQCR (HYDROCOD POLST-CHLORPHEN POLST) one tsp by mouth at night as needed cough Prescriptions: TUSSIONEX PENNKINETIC ER 10-8 MG/5ML LQCR (HYDROCOD POLST-CHLORPHEN POLST) one tsp by mouth at night as needed cough  #8 oz x 0   Entered and Authorized by:   Shaune Leeks MD   Signed by:   Lowella Petties CMA, AAMA on 05/27/2010   Method used:   Telephoned to ...       [id number]       [address]       [place], [place]  [address]       Ph: [phone]       Fax: [phone]   RxID:   [id number]

## 2010-06-21 ENCOUNTER — Ambulatory Visit (INDEPENDENT_AMBULATORY_CARE_PROVIDER_SITE_OTHER): Payer: Commercial Managed Care - PPO | Admitting: Family Medicine

## 2010-06-21 ENCOUNTER — Encounter: Payer: Self-pay | Admitting: Family Medicine

## 2010-06-21 ENCOUNTER — Other Ambulatory Visit: Payer: Self-pay | Admitting: Family Medicine

## 2010-06-21 DIAGNOSIS — R252 Cramp and spasm: Secondary | ICD-10-CM

## 2010-06-21 LAB — BASIC METABOLIC PANEL
BUN: 23 mg/dL (ref 6–23)
CO2: 25 mEq/L (ref 19–32)
Calcium: 8.7 mg/dL (ref 8.4–10.5)
Chloride: 105 mEq/L (ref 96–112)
Creatinine, Ser: 1 mg/dL (ref 0.4–1.2)
GFR: 63.75 mL/min (ref >60.00)
Glucose, Bld: 99 mg/dL (ref 70–99)
Potassium: 4 mEq/L (ref 3.5–5.1)
Sodium: 137 mEq/L (ref 135–145)

## 2010-06-21 LAB — MAGNESIUM: Magnesium: 1.9 mg/dL (ref 1.5–2.5)

## 2010-06-24 ENCOUNTER — Ambulatory Visit: Payer: Self-pay | Admitting: Urology

## 2010-06-29 NOTE — Assessment & Plan Note (Signed)
Summary: bad legs cramps from weightloss program jrt   Vital Signs:  Patient profile:   55 year old female Height:      68 inches Weight:      277 pounds BMI:     42.27 Temp:     97.5 degrees F oral Pulse rate:   84 / minute Pulse rhythm:   regular BP sitting:   126 / 86  (left arm) Cuff size:   large  Vitals Entered By: Delilah Shan CMA Duncan Dull) (June 21, 2010 8:11 AM) CC: Bad leg cramps from weight loss program   History of Present Illness: Started a weight loss program 06/09/10.  She is losing weight.  Last Monday she had a UTI, started antibiotics, and since then she had nocturnal leg cramps.  She added K (via foods) back to her diet and the cramps resolved.  Still on cipro.  Some of the cramps were in the calf and some were in other portions of the leg.  No cramps other places.  Most cramps were at night.  No other trigger known.   She changed to post bariatric diet.  She wanted to try this and then see where she laned with weight.  It was a  ~1000kcal-1200 kcal a day, high protein diet.  Down about 12lbs. She is taking MVI.  No FCNAVD.  Feeling well o/w.   Allergies: 1)  ! Codeine Sulfate (Codeine Sulfate)  Review of Systems       See HPI.  Otherwise negative.    Physical Exam  General:  no apparent distress regular rate and rhythm clear to auscultation bilaterally ext w/o edema calf not tender to palpation bilaterally normal perfusion in ext   Impression & Recommendations:  Problem # 1:  MUSCLE CRAMPS (ICD-729.82) see notes on labs.  improving.  likely improved with higher K foods.  She may have been mildly depleted early in the diet.  I wouldn't supplement with KDUR unless symptoms returned.  She agreed.  follow up as needed . I thanked her for working on her weight.   Orders: TLB-BMP (Basic Metabolic Panel-BMET) (80048-METABOL) TLB-Magnesium (Mg) (83735-MG)  Complete Medication List: 1)  Benadryl 25 Mg Caps (Diphenhydramine hcl) .[name].. 1 as needed allergies 2)   Prilosec Otc 20 Mg Tbec (Omeprazole magnesium) .[name].. 1 daily 3)  Etodolac 400 Mg Tabs (Etodolac) .... One tab by mouth two times a day with food as needed for back pain. use sparingly. 4)  Multivitamins Tabs (Multiple vitamin) .... Take one by mouth daily 5)  Biotin 300 Mcg Tabs (Biotin) .... Take one by mouth daily 6)  Meclizine Hcl 25 Mg Tabs (Meclizine hcl) .... One tab by mouth every 6 hrs as needed dizziness. 7)  Metoprolol Succinate 25 Mg Xr24h-tab (Metoprolol succinate) .... 1/2 tab by mouth once daily 8)  Cyclobenzaprine Hcl 10 Mg Tabs (Cyclobenzaprine hcl) .... 1/2 - 1 tab by mouth three times a day  as needed for back pain. 9)  Hydrocodone-acetaminophen 5-500 Mg Tabs (Hydrocodone-acetaminophen) .... One tab by mouth two times a day as needed back pain 10)  Cipro 250 Mg Tabs (Ciprofloxacin hcl) .... Take 1 tablet by mouth two times a day 11)  Vitamin B-12 1000 Mcg Subl (Cyanocobalamin) .... Once daily  Patient Instructions: 1)  Keep eating high potassium foods and we'll contact you with your lab report.   Take care.  Glad to see you and good luck with your diet.     Orders Added: 1)  Est.  Patient Level III [98119] 2)  TLB-BMP (Basic Metabolic Panel-BMET) [80048-METABOL] 3)  TLB-Magnesium (Mg) [83735-MG]    Current Allergies (reviewed today): ! CODEINE SULFATE (CODEINE SULFATE)

## 2010-07-02 ENCOUNTER — Encounter: Payer: Self-pay | Admitting: Family Medicine

## 2010-07-02 ENCOUNTER — Telehealth: Payer: Self-pay | Admitting: Family Medicine

## 2010-07-06 NOTE — Progress Notes (Signed)
Summary: leg cramps   Phone Note Call from Patient Call back at Home Phone [phone]   Caller: Patient Call For: Dr. Para March  Summary of Call: Patient has still been having issues with leg cramps. She has been eating high ptassium foods, but it doesn't seem to be helping. She is asking if she could get something called in to [facility] dr. Initial call taken by: Melody Comas,  July 02, 2010 9:56 AM  Follow-up for Phone Call        I called back and left message for her to call back.  I want to talk to her about trying K supplement vs changing her muscle relaxer.  I will await return call.  Crawford Givens MD  July 02, 2010 1:27 PM   I called patient back at [phone].  We'll try KDur, since the symptoms started with her diet change.  [facility]  The fax to her clinic is [phone].  Please send order to patient.  Check potassium 2 weeks after starting the Kdur.  dx 729.82.  Please have results sent back to me.   Follow-up by: Crawford Givens MD,  July 02, 2010 2:41 PM  Additional Follow-up for Phone Call Additional follow up Details #1::        Order faxed. Additional Follow-up by: Delilah Shan CMA (AAMA),  July 02, 2010 2:54 PM    New/Updated Medications: POTASSIUM CHLORIDE CR 10 MEQ CR-TABS (POTASSIUM CHLORIDE) 1-2 by mouth qday Prescriptions: POTASSIUM CHLORIDE CR 10 MEQ CR-TABS (POTASSIUM CHLORIDE) 1-2 by mouth qday  #60 x 2   Entered and Authorized by:   Crawford Givens MD   Signed by:   Crawford Givens MD on 07/02/2010   Method used:   [facility]ron[facility] to        [id number]       [address]       [place], [place]  [address]       Ph: [phone]       Fax: [phone]   RxID:   [id number]

## 2010-07-06 NOTE — Miscellaneous (Signed)
  Clinical Lists Changes  Medications: Rx of POTASSIUM CHLORIDE CR 10 MEQ CR-TABS (POTASSIUM CHLORIDE) 1-2 by mouth qday;  #60 x 2;  Signed;  Entered by: Delilah Shan CMA (AAMA);  Authorized by: Crawford Givens MD;  Method used: [id number] [address], [place], [place]  [address], Ph: [phone], Fax: [phone]    Prescriptions: POTASSIUM CHLORIDE CR 10 MEQ CR-TABS (POTASSIUM CHLORIDE) 1-2 by mouth qday  #60 x 2   Entered by:   Delilah Shan CMA (AAMA)   Authorized by:   Crawford Givens MD   Signed by:   Delilah Shan CMA (AAMA) on 07/02/2010   Method used:   Electronically to        [id number]       [address]       [place], [place]  [address]       Ph: [phone]       Fax: [phone]   RxID:   [id number]   Appended Document:     Clinical Lists Changes  Medications: Changed medication from POTASSIUM CHLORIDE CR 10 MEQ CR-TABS (POTASSIUM CHLORIDE) 1-2 by mouth qday to POTASSIUM CHLORIDE CR 10 MEQ CR-TABS (POTASSIUM CHLORIDE) Take 1or 2 tablets by mouth every day. - Signed Rx of POTASSIUM CHLORIDE CR 10 MEQ CR-TABS (POTASSIUM CHLORIDE) Take 1or 2 tablets by mouth every day.;  #60 x 2;  Signed;  Entered by: Delilah Shan CMA (AAMA);  Authorized by: Crawford Givens MD;  Method used: [id number] [address], [place], [place]  [address], Ph: [phone], Fax: [phone]    Prescriptions: POTASSIUM CHLORIDE CR 10 MEQ CR-TABS (POTASSIUM CHLORIDE) Take 1or 2 tablets by mouth every day.  #60 x 2   Entered by:   Delilah Shan CMA (AAMA)   Authorized by:   Crawford Givens MD   Signed by:   Delilah Shan CMA (AAMA) on 07/02/2010   Method used:   Electronically to        [id number]       [address]       [place], [place]  [address]       Ph: [phone]       Fax: [phone]   RxID:   [id number]

## 2010-08-30 ENCOUNTER — Other Ambulatory Visit: Payer: Self-pay | Admitting: Surgery

## 2010-08-30 DIAGNOSIS — N6321 Unspecified lump in the left breast, upper outer quadrant: Secondary | ICD-10-CM

## 2010-09-08 ENCOUNTER — Ambulatory Visit
Admission: RE | Admit: 2010-09-08 | Discharge: 2010-09-08 | Disposition: A | Discharge status: Home / Self Care | Payer: Commercial Managed Care - PPO | Source: Ambulatory Visit | Attending: Surgery | Admitting: Surgery

## 2010-09-08 DIAGNOSIS — N6321 Unspecified lump in the left breast, upper outer quadrant: Secondary | ICD-10-CM

## 2010-10-12 ENCOUNTER — Other Ambulatory Visit: Payer: Self-pay | Admitting: Family Medicine

## 2010-12-10 ENCOUNTER — Encounter: Payer: Self-pay | Admitting: Family Medicine

## 2010-12-10 ENCOUNTER — Ambulatory Visit (INDEPENDENT_AMBULATORY_CARE_PROVIDER_SITE_OTHER): Payer: Commercial Managed Care - PPO | Admitting: Family Medicine

## 2010-12-10 DIAGNOSIS — R079 Chest pain, unspecified: Secondary | ICD-10-CM

## 2010-12-10 DIAGNOSIS — R0789 Other chest pain: Secondary | ICD-10-CM

## 2010-12-10 NOTE — Assessment & Plan Note (Signed)
No h/o CAD in patient, no FH of early CAD.  H/o HTN, on meds.  No smoking, cocaine, DM2.  No exertional component and no sx now.  Normal EKG, compared to prev and no sig change.  Prev stress echo wnl.  D/w pt- unlikely to be cardiac.  Possibly NSAID related, or due to black cohosh.  She'll stay off mobic and monitor sx.  If progressive, then notify the clinic.  >25 min spent with face to face with patient, >50% counseling.  She agrees. Nontoxic.

## 2010-12-10 NOTE — Patient Instructions (Signed)
I would check your pressure at home in the morning.  Let us know if it stays elevated.  I think your symptoms today are possibly related to the mobic or the black cohosh.  I would take the lodine instead of the mobic and continue the omeprazole.  Let us know if you have more troubles.  Take care.

## 2010-12-10 NOTE — Progress Notes (Signed)
Woke up this AM, felt well.  Ate breakfast and then had some upper chest discomfort.  She felt a little weak.  Took an antacid with some relief.  She got back up and sx returned.  She sat down again, pulse was ~100.  Went to work and sx restarted as she was walking in.  SBP was checked ~128 at surgical office this AM, by palpation.  "It wasn't chest pain, but it was pressure that felt weird."  Not sob.  No sx now.  No NTG use.  On PPI and black cohosh.  Mobic started recently along with black cohosh- she changed back to lodine from the mobic since it didn't work as well.   Prev with stress test, negative.  HTN, but no h/o CAD.  FH on CAD, but later in life.    PMH and SH reviewed  ROS: See HPI, otherwise noncontributory.  Meds, vitals, and allergies reviewed.   No BP heard on L arm, ~150/100, faint in L arm.  This is not a new issue for patient.   nad ncat Neck supple rrr Ctab, no wheeze Chest wall not ttp Ext w/o pitting edema abd soft, not ttp

## 2011-01-18 LAB — HM COLONOSCOPY: HM Colonoscopy: NORMAL

## 2011-02-21 ENCOUNTER — Other Ambulatory Visit: Payer: Self-pay | Admitting: Family Medicine

## 2011-02-21 NOTE — Telephone Encounter (Signed)
Received refill electronically from pharmacy. Is it okay to refill medication? 

## 2011-02-23 ENCOUNTER — Encounter: Payer: Self-pay | Admitting: Family Medicine

## 2011-02-23 ENCOUNTER — Ambulatory Visit (INDEPENDENT_AMBULATORY_CARE_PROVIDER_SITE_OTHER): Payer: Commercial Managed Care - PPO | Admitting: Family Medicine

## 2011-02-23 DIAGNOSIS — I10 Essential (primary) hypertension: Secondary | ICD-10-CM

## 2011-02-23 DIAGNOSIS — R197 Diarrhea, unspecified: Secondary | ICD-10-CM | POA: Insufficient documentation

## 2011-02-23 MED ORDER — METOPROLOL SUCCINATE ER 25 MG PO TB24: ORAL_TABLET | ORAL | Status: DC

## 2011-02-23 NOTE — Assessment & Plan Note (Signed)
Alternating diarrhea/constipation three times in last twelve months. Will get endoscopy up and down by Dr Mechele Collin in a few weeks.

## 2011-02-23 NOTE — Patient Instructions (Signed)
RTC Feb or Mar with Dr Para March for BP recheck, sooner if symptoms require.

## 2011-02-23 NOTE — Progress Notes (Signed)
  Subjective:    Patient ID: Tiffany Wells, female    DOB: 26-Feb-1956, 55 y.o.   MRN: [id number]  HPI Pt here to discuss BP control. She is scheduled to have colonoscopy and endoscopy in a few weeks and her BP was elevated when seen initially by the nurse practitioner for eval. She has felt well in regard to her BP, her endoscopies are for three episodes of alternating diarrhea and constipation over a less than 12 month period. She has felt well tho and the last time was while travelling in [place] with her husband who had no problems. She has not had weight loss and appetite has been unaffected typically at these times.    Review of Systems  Constitutional: Negative for fever, chills, diaphoresis, activity change and fatigue.  HENT: Negative for ear pain, congestion, rhinorrhea and postnasal drip.   Eyes: Negative for redness.  Respiratory: Negative for cough, chest tightness, shortness of breath and wheezing.   Cardiovascular: Negative for chest pain.       Objective:   Physical Exam  Constitutional: She appears well-developed and well-nourished. No distress.  HENT:  Head: Normocephalic and atraumatic.  Right Ear: External ear normal.  Left Ear: External ear normal.  Nose: Nose normal.  Mouth/Throat: Oropharynx is clear and moist. No oropharyngeal exudate.  Eyes: Conjunctivae and EOM are normal. Pupils are equal, round, and reactive to light.  Neck: Normal range of motion. Neck supple. No thyromegaly present.  Cardiovascular: Normal rate, regular rhythm and normal heart sounds.   Pulmonary/Chest: Effort normal and breath sounds normal. She has no wheezes. She has no rales.  Abdominal: Soft. Bowel sounds are normal. She exhibits no distension and no mass. There is no tenderness. There is no rebound and no guarding.  Lymphadenopathy:    She has no cervical adenopathy.  Skin: She is not diaphoretic.          Assessment & Plan:

## 2011-02-23 NOTE — Assessment & Plan Note (Signed)
High at times, last when seen at GI for initial eval. Will increase to 1/2 of 25mg  Toprol twice a day. May go to whole once a day if easier for her. Recheck Feb/Mar with Dr Para March.

## 2011-03-07 ENCOUNTER — Ambulatory Visit: Payer: Self-pay | Admitting: Unknown Physician Specialty

## 2011-03-07 HISTORY — PX: UPPER GASTROINTESTINAL ENDOSCOPY: SHX188

## 2011-03-07 HISTORY — PX: COLONOSCOPY: SHX174

## 2011-03-08 LAB — PATHOLOGY REPORT

## 2011-03-16 ENCOUNTER — Encounter: Payer: Self-pay | Admitting: Family Medicine

## 2011-03-16 ENCOUNTER — Ambulatory Visit: Payer: Self-pay | Admitting: Unknown Physician Specialty

## 2011-03-16 DIAGNOSIS — K317 Polyp of stomach and duodenum: Secondary | ICD-10-CM | POA: Insufficient documentation

## 2011-03-16 DIAGNOSIS — K449 Diaphragmatic hernia without obstruction or gangrene: Secondary | ICD-10-CM | POA: Insufficient documentation

## 2011-03-17 ENCOUNTER — Encounter: Payer: Self-pay | Admitting: Family Medicine

## 2011-05-04 ENCOUNTER — Other Ambulatory Visit: Payer: Self-pay | Admitting: Family Medicine

## 2011-05-05 NOTE — Telephone Encounter (Signed)
Electronic refill request

## 2011-10-02 ENCOUNTER — Other Ambulatory Visit: Payer: Self-pay | Admitting: Family Medicine

## 2011-10-03 NOTE — Telephone Encounter (Signed)
Sent!

## 2011-10-03 NOTE — Telephone Encounter (Signed)
Electronic refill request.  Please advise. 

## 2011-11-28 ENCOUNTER — Ambulatory Visit: Payer: Self-pay | Admitting: General Surgery

## 2011-12-21 ENCOUNTER — Ambulatory Visit: Payer: Self-pay | Admitting: Pain Medicine

## 2012-01-31 ENCOUNTER — Other Ambulatory Visit: Payer: Self-pay | Admitting: Family Medicine

## 2012-02-01 NOTE — Telephone Encounter (Signed)
Ok to refill 

## 2012-02-02 NOTE — Telephone Encounter (Signed)
Sent!

## 2012-02-12 ENCOUNTER — Other Ambulatory Visit: Payer: Self-pay | Admitting: Family Medicine

## 2012-03-05 ENCOUNTER — Other Ambulatory Visit: Payer: Self-pay | Admitting: Family Medicine

## 2012-03-19 ENCOUNTER — Encounter: Payer: Self-pay | Admitting: Internal Medicine

## 2012-03-19 ENCOUNTER — Ambulatory Visit (INDEPENDENT_AMBULATORY_CARE_PROVIDER_SITE_OTHER): Payer: Commercial Managed Care - PPO | Admitting: Internal Medicine

## 2012-03-19 VITALS — BP 134/80 | HR 95 | Temp 97.8°F | Ht 68.5 in | Wt 286.2 lb

## 2012-03-19 DIAGNOSIS — Z8619 Personal history of other infectious and parasitic diseases: Secondary | ICD-10-CM | POA: Insufficient documentation

## 2012-03-19 DIAGNOSIS — R232 Flushing: Secondary | ICD-10-CM

## 2012-03-19 DIAGNOSIS — Z1239 Encounter for other screening for malignant neoplasm of breast: Secondary | ICD-10-CM

## 2012-03-19 DIAGNOSIS — E669 Obesity, unspecified: Secondary | ICD-10-CM

## 2012-03-19 DIAGNOSIS — R5383 Other fatigue: Secondary | ICD-10-CM

## 2012-03-19 DIAGNOSIS — D649 Anemia, unspecified: Secondary | ICD-10-CM

## 2012-03-19 DIAGNOSIS — R5381 Other malaise: Secondary | ICD-10-CM

## 2012-03-19 DIAGNOSIS — R252 Cramp and spasm: Secondary | ICD-10-CM

## 2012-03-19 DIAGNOSIS — E559 Vitamin D deficiency, unspecified: Secondary | ICD-10-CM

## 2012-03-19 DIAGNOSIS — R030 Elevated blood-pressure reading, without diagnosis of hypertension: Secondary | ICD-10-CM

## 2012-03-19 DIAGNOSIS — K648 Other hemorrhoids: Secondary | ICD-10-CM | POA: Insufficient documentation

## 2012-03-19 NOTE — Patient Instructions (Addendum)
Try the Minivelle transdermal estrogen for a month,  One patch lasts 3-4 days.  If the flushing does not resolve, we will consider a 24 hr BP monitor    This is  my version of a  "Low GI"  Diet:  All of the foods can be found at grocery stores and in bulk at [employer].  The Atkins protein bars and shakes are available in more varieties at [employer], [employer] and [employer].     7 AM Breakfast:  Low carbohydrate Protein  Shakes (I recommend the EAS AdvantEdge "Carb Control" shakes  Or the low carb shakes by Atkins.   Both are available everywhere:  In  cases at [employer]Js  Or in 4 packs at grocery stores and pharmacies  2.5 carbs  (Alternative is  a toasted Arnold's Sandwhich Thin w/ peanut butter, a "Bagel Thin" with cream cheese and salmon) or  a scrambled egg burrito made with a low carb tortilla .  Avoid cereal and bananas, oatmeal too unless you are cooking the old fashioned kind that takes 30-40 minutes to prepare.  the rest is overly processed, has minimal fiber, and is loaded with carbohydrates!   10 AM: Protein bar by Atkins (the snack size, under 200 cal).  There are many varieties , available widely again or in bulk in limited varieties at BJs)  Other so called "protein bars" tend to be loaded with carbohydrates.  Remember, in food advertising, the word "energy" is synonymous for " carbohydrate."  Lunch: sandwich of [place], (or any lunchmeat, grilled meat or canned tuna), fresh avocado, mayonnaise  and cheese on a lower carbohydrate pita bread, flatbread, or tortilla . Ok to use regular mayonnaise. The bread is the only source or carbohydrate that can be decreased (Joseph's makes a pita bread and a flat bread that are 50 cal and 4 net carbs ; Toufayan makes a low carb flatbread that's 100 cal and 9 net carbs  and  Mission makes a low carb whole wheat tortilla  That is 210 cal and 6 net carbs)  3 PM:  Mid day :  Another protein bar,  Or a  cheese stick (100 cal, 0 carbs),  Or 1 ounce of  almonds,  walnuts, pistachios, pecans, peanuts,  Macadamia nuts. Or a Dannon light n Fit greek yogurt, 80 cal 8 net carbs . Avoid "granola"; the dried cranberries and raisins are loaded with carbohydrates. Mixed nuts ok if no raisins or cranberries or dried fruit.      6 PM  Dinner:  "mean and green:"  Meat/chicken/fish or a high protein legume; , with a green salad, and a low GI  Veggie (broccoli, cauliflower, green beans, spinach, brussel sprouts. Lima beans) : Avoid "Low fat dressings, as well as Reyne Dumas and [address]! They are loaded with sugar! Instead use ranch, vinagrette,  Blue cheese, etc.  There is a low carb pasta by Dreamfield's available at [facility] that is acceptable and tastes great. Try Michel Angel's chicken piccata over low carb pasta. The chicken dish is 0 carbs, and can be found in frozen section at BJs and [employer]. Also try [employer] "Carnitas" (pulled pork, no sauce,  0 carbs) and his pot roast.   both are in the refrigerated section at BJs   9 PM snack : Breyer's "low carb" fudgsicle or  ice cream bar (Carb Smart line), or  Weight Watcher's ice cream bar , or another "no sugar added" ice cream;a serving of fresh berries/cherries  with whipped cream (Avoid bananas, pineapple, grapes  and watermelon on a regular basis because they are high in sugar)   Remember that snack Substitutions should be less than 15 to 20 carbs  Per serving. Remember to subtract fiber grams and sugar alcohols to get the "net carbs."

## 2012-03-19 NOTE — Progress Notes (Signed)
Patient ID: Tiffany Wells, female   DOB: 1955/06/25, 56 y.o.   MRN: [id number]  Patient Active Problem List  Diagnosis  . UNSPECIFIED SUBJECTIVE VISUAL DISTURBANCE  . ESSENTIAL HYPERTENSION  . EDEMA  . MUSCLE CRAMPS  . Atypical chest pain  . Diarrhea  . Hiatal hernia  . Multiple gastric polyps  . Internal hemorrhoids  . History of Salmonella gastroenteritis  . Obesity, morbid, BMI 40.0-49.9    Subjective:  CC:   Chief Complaint  Patient presents with  . Establish Care    HPI:   Tiffany Wells is a 56 y.o. female who presents as a new patient to establish primary care with the chief complaint of Obesity and weight management.  She has struggled with her weight on and off for the last 20 years and has lost as much as 80 TO 100 LBS  In the past but has never been able to sustain the weigh loss for more than a year  at the most.  She has had success with various diets including, [place], an ultra low calorie diet (800 cal), an an ultra low fat,which caused excessive hair loss. Her ability to exercise rigorously is hindered by severe bilateral DJD knees, with loss of all cartilage per prior orthopedic evaluation.  She is enrolling in a bariatric surgery program with plans for eventual surgery by Kerrin Champagne.  Today is her first of 6 monthly office visits to me in keeping with the requirements of the program. She receives intraarticular hyaline injections every 3 to 4 months which has helped significantly, (every 4 months,  2 rounds of 3)but her next next round has been postponed until after Christmas .  She has a history of back pain and sciatica,  nonsurgical by prior MRI , and has had evaluation and treatment by Debroah Loop  With steroids which resolved her sciatica pain.  Pain is managed with infrequent use of tramadol. 4) recent development of hot flashes from the waist up which occur with prolonged standing, not with exercise. Denies palpitations, presyncopal symptoms, chest  pain,  Post prandial occurrence.     Past Medical History  Diagnosis Date  . Back pain   . Hypertension   . Normal cardiac stress test     2006, nromal stress cardiolite, EF 65%  . Internal hemorrhoids   . History of Salmonella gastroenteritis 2012    Past Surgical History  Procedure Date  . Rotator cuff repair   . Tubal ligation   . Colonoscopy 03/07/2011    Int Hemms  . Hysteroscopy   . Upper gastrointestinal endoscopy 03/07/2011    HH Multip Gastric Polyps/ Dr Mechele Collin    Family History  Problem Relation Age of Onset  . Hypertension Mother   . Diabetes Mother   . Hypertension Father   . Heart disease Father     CAD with CABG  . Arthritis Sister   . Hypertension Sister   . Cancer Brother     Pre-cancerous lip lesion    History   Social History  . Marital Status: Married    Spouse Name: N/A    Number of Children: 2  . Years of Education: N/A   Occupational History  . Print production planner, [employer]    Social History Main Topics  . Smoking status: Never Smoker   . Smokeless tobacco: Never Used  . Alcohol Use: Yes     Comment: very rare  . Drug Use: No  . Sexually Active: Not on file  Other Topics Concern  . Not on file   Social History Narrative   Lives with husband.   Allergies  Allergen Reactions  . Codeine Sulfate     REACTION: Itching     Review of Systems:   Patient denies headache, fevers, malaise, unintentional weight loss, skin rash, eye pain, sinus congestion and sinus pain, sore throat, dysphagia,  hemoptysis , cough, dyspnea, wheezing, chest pain, palpitations, orthopnea, edema, abdominal pain, nausea, melena, diarrhea, constipation, flank pain, dysuria, hematuria, urinary  Frequency, nocturia, numbness, tingling, seizures,  Focal weakness, Loss of consciousness,  Tremor, insomnia, depression, anxiety, and suicidal ideation.     Objective:  BP 134/80  Pulse 95  Temp 97.8 F (36.6 C) (Oral)  Ht 5' 8.5" (1.74 m)  Wt 286 lb 4 oz  (129.842 kg)  BMI 42.89 kg/m2  SpO2 99%  General appearance: alert, cooperative and appears stated age Neck: no adenopathy, no carotid bruit, supple, symmetrical, trachea midline and thyroid not enlarged, symmetric, no tenderness/mass/nodules Back: symmetric, no curvature. ROM normal. No CVA tenderness. Lungs: clear to auscultation bilaterally Heart: regular rate and rhythm, S1, S2 normal, no murmur, click, rub or gallop Abdomen: soft, non-tender; bowel sounds normal; no masses,  no organomegaly Pulses: 2+ and symmetric Skin: Skin color, texture, turgor normal. No rashes or lesions  Assessment and Plan:  Obesity, morbid, BMI 40.0-49.9 I have addressed  BMI and recommended a low glycemic index diet utilizing 6 smaller meals daily,  to increase metabolism.  Caloric goal 1400 calories daily ,  Carbohydrate goal  40 carbs daily. Printed handout given. have also recommended that patient find an exercise that she can engage in that does not stress her knees ,  Including swimming ,  Recumbent bike or "Arcelia Jew" with a goal of 30 minutes of aerobic exercise a minimum of 5 days per week. Screening for lipid disorders, thyroid and diabetes to be done today. Return in one month.     Flushing reaction History is not suggestive of pheochromocytoma, carcinoid or PAF.  Suggest trial of transdermal estrogen, samples given of minivelle 0.1 mg One month supply.  If no change in symptoms will consider ambulatory BP monitor , Holter monitor. IF bp fluctuates will send 24 hour urine for catecholamines but we will have to stop her metoprolol for this screening.    Updated Medication List Outpatient Encounter Prescriptions as of 03/19/2012  Medication Sig Dispense Refill  . Biotin 300 MCG TABS Take 1 tablet by mouth daily.        [name] CRANBERRY EXTRACT PO 1200 mg a day       . cyclobenzaprine (FLEXERIL) 10 MG tablet TAKE 1/2 - 1 TABLET BY MOUTH 3 TIMES A DAY AS NEEDED FOR BACK PAIN.  60 tablet  2  .  diphenhydrAMINE (BENADRYL) 25 MG tablet As needed for allergies.       . diphenoxylate-atropine (LOMOTIL) 2.5-0.025 MG per tablet Take 1 tablet by mouth as needed.      [name] estradiol (ESTRACE) 0.1 MG/GM vaginal cream Place 2 g vaginally daily.      [name] etodolac (LODINE) 500 MG tablet Take 500 mg by mouth 2 (two) times daily.       [name] HYDROcodone-acetaminophen (VICODIN) 5-500 MG per tablet Take 1 tablet by mouth two times a day as needed for back pain.       . metoprolol succinate (TOPROL-XL) 25 MG 24 hr tablet TAKE 1/2 TABLET BY MOUTH TWICE A DAY  30 tablet  0  .  Multiple Vitamin (MULTIVITAMIN) tablet Take 1 tablet by mouth daily.        . NON FORMULARY Black Kohash for hot flashes.       [name] omeprazole (PRILOSEC OTC) 20 MG tablet Take 20 mg by mouth daily.        . potassium chloride (MICRO-K) 10 MEQ CR capsule Take 1 Tablet  by mouth 3 times a week      . potassium chloride SA (K-DUR,KLOR-CON) 20 MEQ tablet Take 20 mEq by mouth 3 (three) times daily.      . traMADol (ULTRAM) 50 MG tablet Take 50 mg by mouth every 6 (six) hours as needed.      [name] estradiol (MINIVELLE) 0.1 MG/24HR Place 1 patch (0.1 mg total) onto the skin 2 (two) times a week.  8 patch  0  . meclizine (ANTIVERT) 25 MG tablet Take 25 mg by mouth every 6 (six) hours as needed.        . Nutritional Supplements (GLUCOSAMINE COMPLEX PO) Take 1 tablet by mouth daily.

## 2012-03-20 DIAGNOSIS — R232 Flushing: Secondary | ICD-10-CM | POA: Insufficient documentation

## 2012-03-20 MED ORDER — ESTRADIOL 0.1 MG/24HR TD PTTW: 1.0000 | MEDICATED_PATCH | TRANSDERMAL | Status: DC

## 2012-03-20 NOTE — Assessment & Plan Note (Addendum)
History is not suggestive of pheochromocytoma, carcinoid or PAF.  Suggest trial of transdermal estrogen, samples given of minivelle 0.1 mg One month supply.  If no change in symptoms will consider ambulatory BP monitor , Holter monitor. IF bp fluctuates will send 24 hour urine for catecholamines but we will have to stop her metoprolol for this screening.

## 2012-03-20 NOTE — Assessment & Plan Note (Signed)
I have addressed  BMI and recommended a low glycemic index diet utilizing 6 smaller meals daily,  to increase metabolism.  Caloric goal 1400 calories daily ,  Carbohydrate goal  40 carbs daily. Printed handout given. have also recommended that patient find an exercise that she can engage in that does not stress her knees ,  Including swimming ,  Recumbent bike or "Arcelia Jew" with a goal of 30 minutes of aerobic exercise a minimum of 5 days per week. Screening for lipid disorders, thyroid and diabetes to be done today. Return in one month.

## 2012-04-05 ENCOUNTER — Other Ambulatory Visit: Payer: Self-pay | Admitting: Family Medicine

## 2012-04-16 ENCOUNTER — Telehealth: Payer: Self-pay | Admitting: Internal Medicine

## 2012-04-16 NOTE — Telephone Encounter (Signed)
LMOVM for pt to return call 

## 2012-04-16 NOTE — Telephone Encounter (Signed)
Recent fasting labs were all normal with except of LDL slightly high at 123. (goal is 100 or less given history of hypertension) I do not want to start meds at this time since it will come down with her weight loss program.

## 2012-04-17 NOTE — Telephone Encounter (Signed)
Pt.notified

## 2012-04-24 ENCOUNTER — Ambulatory Visit: Payer: Self-pay | Admitting: Internal Medicine

## 2012-04-24 LAB — HM MAMMOGRAPHY: HM Mammogram: NORMAL

## 2012-04-25 ENCOUNTER — Telehealth: Payer: Self-pay | Admitting: Internal Medicine

## 2012-04-25 NOTE — Telephone Encounter (Signed)
Her mammogram was normal.  Repeat in one year 

## 2012-04-26 ENCOUNTER — Ambulatory Visit (INDEPENDENT_AMBULATORY_CARE_PROVIDER_SITE_OTHER): Payer: Commercial Managed Care - PPO | Admitting: Internal Medicine

## 2012-04-26 ENCOUNTER — Telehealth: Payer: Self-pay | Admitting: Internal Medicine

## 2012-04-26 ENCOUNTER — Encounter: Payer: Self-pay | Admitting: Internal Medicine

## 2012-04-26 DIAGNOSIS — Z1331 Encounter for screening for depression: Secondary | ICD-10-CM

## 2012-04-26 DIAGNOSIS — R232 Flushing: Secondary | ICD-10-CM

## 2012-04-26 NOTE — Progress Notes (Signed)
Patient ID: Tiffany Wells, female   DOB: 1955-12-13, 57 y.o.   MRN: [id number]  Patient Active Problem List  Diagnosis  . UNSPECIFIED SUBJECTIVE VISUAL DISTURBANCE  . ESSENTIAL HYPERTENSION  . EDEMA  . MUSCLE CRAMPS  . Atypical chest pain  . Diarrhea  . Hiatal hernia  . Multiple gastric polyps  . Internal hemorrhoids  . History of Salmonella gastroenteritis  . Obesity, morbid, BMI 40.0-49.9  . Flushing reaction    Subjective:  CC:   Chief Complaint  Patient presents with  . Follow-up    HPI:   Tiffany Wells a 57 y.o. female who presents for a one month follow up on chronic medical conditions including obesity with plans for bariatric surgery, and postmenopausal syndrome. She has lost 7 pounds in the last month using a carbohydrate restricted diet. She's been limiting herself to 1400 calories per day and eating several smaller meals daily. She's having difficulty remembering to eat every 3 hours and I suggested using her cell phone as an alarm. She is not currently exercising due to severe bilateral DJD of the knees. She does have a recumbent bike at home and is planning on starting a program of exercise. 20 minutes 3 times a week with eventual goal 30 minutes 5 days a week. And also suggested that the niece that may give her an alternative to the recumbent bike if she finds it she has excessive knee pain after starting this exercise. 2)  HRT:   she was given samples of transdermal minvelle transdermal estrogen patches for management of hot flashes but discontinued them after week 2 when she attributed new onset emotional lability with hostility to estrogen supplementation .  Her emotional lability has resolved.    Past Medical History  Diagnosis Date  . Back pain   . Hypertension   . Normal cardiac stress test     2006, nromal stress cardiolite, EF 65%  . Internal hemorrhoids   . History of Salmonella gastroenteritis 2012    Past Surgical History  Procedure Date  .  Rotator cuff repair   . Tubal ligation   . Colonoscopy 03/07/2011    Int Hemms  . Hysteroscopy   . Upper gastrointestinal endoscopy 03/07/2011    HH Multip Gastric Polyps/ Dr Mechele Collin     The following portions of the patient's history were reviewed and updated as appropriate: Allergies, current medications, and problem list.    Review of Systems:   12 Pt  review of systems was negative except those addressed in the HPI,     History   Social History  . Marital Status: Married    Spouse Name: N/A    Number of Children: 2  . Years of Education: N/A   Occupational History  . Print production planner, [employer]    Social History Main Topics  . Smoking status: Never Smoker   . Smokeless tobacco: Never Used  . Alcohol Use: Yes     Comment: very rare  . Drug Use: No  . Sexually Active: Not on file   Other Topics Concern  . Not on file   Social History Narrative   Lives with husband.    Objective:  BP 132/78  Pulse 94  Temp 97.7 F (36.5 C) (Oral)  Resp 16  Wt 279 lb 8 oz (126.78 kg)  SpO2 97%  General appearance: alert, cooperative and appears stated age Ears: normal TM's and external ear canals both ears Throat: lips, mucosa, and tongue normal; teeth and  gums normal Neck: no adenopathy, no carotid bruit, supple, symmetrical, trachea midline and thyroid not enlarged, symmetric, no tenderness/mass/nodules Back: symmetric, no curvature. ROM normal. No CVA tenderness. Lungs: clear to auscultation bilaterally Heart: regular rate and rhythm, S1, S2 normal, no murmur, click, rub or gallop Abdomen: soft, non-tender; bowel sounds normal; no masses,  no organomegaly Pulses: 2+ and symmetric Skin: Skin color, texture, turgor normal. No rashes or lesions Lymph nodes: Cervical, supraclavicular, and axillary nodes normal.  Assessment and Plan:  Obesity, morbid, BMI 40.0-49.9 Improving with weight loss of 7 pounds in the first month of follow up using a modified low  carbohydrate low-calorie diet. Continue current diet adding exercise as tolerated by severe bilateral DJD of knees.  Flushing reaction Currently resolved. Prior trial of medial transdermal estrogen patches was not tolerated due to increased emotional lability.   Updated Medication List Outpatient Encounter Prescriptions as of 04/26/2012  Medication Sig Dispense Refill  . Biotin 300 MCG TABS Take 1 tablet by mouth daily.        [name] CRANBERRY EXTRACT PO 1200 mg a day       . cyclobenzaprine (FLEXERIL) 10 MG tablet TAKE 1/2 - 1 TABLET BY MOUTH 3 TIMES A DAY AS NEEDED FOR BACK PAIN.  60 tablet  2  . diphenhydrAMINE (BENADRYL) 25 MG tablet As needed for allergies.       . diphenoxylate-atropine (LOMOTIL) 2.5-0.025 MG per tablet Take 1 tablet by mouth as needed.      [name] estradiol (ESTRACE) 0.1 MG/GM vaginal cream Place 2 g vaginally daily.      [name] etodolac (LODINE) 500 MG tablet Take 500 mg by mouth 2 (two) times daily.       [name] HYDROcodone-acetaminophen (VICODIN) 5-500 MG per tablet Take 1 tablet by mouth two times a day as needed for back pain.       . metoprolol succinate (TOPROL-XL) 25 MG 24 hr tablet TAKE 1/2 TABLET BY MOUTH TWICE A DAY  30 tablet  11  . Multiple Vitamin (MULTIVITAMIN) tablet Take 1 tablet by mouth daily.        . Nutritional Supplements (GLUCOSAMINE COMPLEX PO) Take 1 tablet by mouth daily.        [name] omeprazole (PRILOSEC OTC) 20 MG tablet Take 20 mg by mouth daily.        . potassium chloride SA (K-DUR,KLOR-CON) 20 MEQ tablet Take 20 mEq by mouth as needed. For leg cramps      . traMADol (ULTRAM) 50 MG tablet Take 50 mg by mouth every 6 (six) hours as needed.      . [DISCONTINUED] potassium chloride (MICRO-K) 10 MEQ CR capsule Take 1 Tablet  by mouth 3 times a week      . meclizine (ANTIVERT) 25 MG tablet Take 25 mg by mouth every 6 (six) hours as needed.        . [DISCONTINUED] estradiol (MINIVELLE) 0.1 MG/24HR Place 1 patch (0.1 mg total) onto the skin 2 (two) times a week.  8  patch  0  . [DISCONTINUED] NON FORMULARY Black Kohash for hot flashes.

## 2012-04-26 NOTE — Assessment & Plan Note (Signed)
Improving with weight loss of 7 pounds in the first month of follow up using a modified low carbohydrate low-calorie diet. Continue current diet adding exercise as tolerated by severe bilateral DJD of knees.

## 2012-04-26 NOTE — Assessment & Plan Note (Signed)
Currently resolved. Prior trial of medial transdermal estrogen patches was not tolerated due to increased emotional lability.

## 2012-04-26 NOTE — Telephone Encounter (Signed)
Can you please call [facility] and find out if this patient's labs from December can be sent over AGAIN OR put into the EPIC interface?  thanks

## 2012-04-26 NOTE — Telephone Encounter (Signed)
They results are being faxed over

## 2012-05-08 ENCOUNTER — Encounter: Payer: Self-pay | Admitting: Internal Medicine

## 2012-05-09 ENCOUNTER — Encounter: Payer: Self-pay | Admitting: Internal Medicine

## 2012-05-23 ENCOUNTER — Ambulatory Visit: Payer: Commercial Managed Care - PPO | Admitting: Internal Medicine

## 2012-06-01 ENCOUNTER — Ambulatory Visit: Payer: Commercial Managed Care - PPO | Admitting: Internal Medicine

## 2012-07-08 ENCOUNTER — Other Ambulatory Visit: Payer: Self-pay | Admitting: General Surgery

## 2012-07-13 ENCOUNTER — Encounter: Payer: Self-pay | Admitting: Internal Medicine

## 2012-07-13 ENCOUNTER — Ambulatory Visit (INDEPENDENT_AMBULATORY_CARE_PROVIDER_SITE_OTHER): Payer: Commercial Managed Care - PPO | Admitting: Internal Medicine

## 2012-07-13 DIAGNOSIS — IMO0002 Reserved for concepts with insufficient information to code with codable children: Secondary | ICD-10-CM

## 2012-07-13 DIAGNOSIS — I10 Essential (primary) hypertension: Secondary | ICD-10-CM

## 2012-07-13 DIAGNOSIS — M1710 Unilateral primary osteoarthritis, unspecified knee: Secondary | ICD-10-CM

## 2012-07-13 MED ORDER — CYCLOBENZAPRINE HCL 10 MG PO TABS: ORAL_TABLET | ORAL | Status: DC

## 2012-07-13 MED ORDER — ETODOLAC 500 MG PO TABS: 500.0000 mg | ORAL_TABLET | Freq: Two times a day (BID) | ORAL | Status: DC

## 2012-07-13 NOTE — Progress Notes (Addendum)
Patient ID: Tiffany Wells, female   DOB: 1956/03/12, 57 y.o.   MRN: [id number]   Patient Active Problem List  Diagnosis  . ESSENTIAL HYPERTENSION  . Hiatal hernia  . Internal hemorrhoids  . History of Salmonella gastroenteritis  . Obesity, morbid, BMI 40.0-49.9  . Flushing reaction    Subjective:  CC:   Chief Complaint  Patient presents with  . Follow-up    HPI:   Tiffany Wells a 57 y.o. female who presents followup visit for weight management . Patient was last seen on January 9 at which point she had lost 7 pounds using a carbohydrate restricted 1400-calorie diet. 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 [name] Her husbands clinic transitioned from to [facility], an electronic medical record and the transition has been extremely time consuming .  She's been averaging 80 hours a week and work and as a result has not been adhering to her diet. Her weight has plateaued since January .  She has attempted to start using a recumbent bike for exercise but has been limited by the chronic severe pain in her knees due to DJD.   Needs her lodine and flexeril refilled  And she is scheduled to Return to [facility] for another knee injection  (was due in February) .  No new issues      Past Medical History  Diagnosis Date  . Back pain   . Hypertension   . Normal cardiac stress test     2006, nromal stress cardiolite, EF 65%  . Internal hemorrhoids   . History of Salmonella gastroenteritis 2012    Past Surgical History  Procedure Laterality Date  . Rotator cuff repair    . Tubal ligation    . Colonoscopy  03/07/2011    Int Hemms  . Hysteroscopy    . Upper gastrointestinal endoscopy  03/07/2011    [facility] Multip Gastric Polyps/ Dr Mechele Collin       The following portions of the patient's history were reviewed and updated as appropriate: Allergies, current medications, and problem  list.    Review of Systems:  Patient denies headache, fevers, malaise, unintentional weight loss, skin rash, eye pain, sinus congestion and sinus pain, sore throat, dysphagia,  hemoptysis , cough, dyspnea, wheezing, chest pain, palpitations, orthopnea, edema, abdominal pain, nausea, melena, diarrhea, constipation, flank pain, dysuria, hematuria, urinary  Frequency, nocturia, numbness, tingling, seizures,  Focal weakness, Loss of consciousness,  Tremor, insomnia, depression, anxiety, and suicidal ideation.       History   Social History  . Marital Status: Married    Spouse Name: N/A    Number of Children: 2  . Years of Education: N/A   Occupational History  . Print production planner, [employer]    Social History Main Topics  . Smoking status: Never Smoker   . Smokeless tobacco: Never Used  . Alcohol Use: Yes     Comment: very rare  . Drug Use: No  . Sexually Active: Not on file   Other Topics Concern  . Not on file   Social History Narrative   Lives with husband.    Objective:  BP 132/78  Pulse 104  Temp(Src) 97.6 F (36.4 C) (Oral)  Resp 17  Ht 5\' 8"  (1.727 m)  Wt 279 lb (126.554 kg)  BMI 42.43 kg/m2  SpO2 99%  General appearance: alert, cooperative and appears stated age Ears:  normal TM's and external ear canals both ears Throat: lips, mucosa, and tongue normal; teeth and gums normal Neck: no adenopathy, no carotid bruit, supple, symmetrical, trachea midline and thyroid not enlarged, symmetric, no tenderness/mass/nodules Back: symmetric, no curvature. ROM normal. No CVA tenderness. Lungs: clear to auscultation bilaterally Heart: regular rate and rhythm, S1, S2 normal, no murmur, click, rub or gallop Abdomen: soft, non-tender; bowel sounds normal; no masses,  no organomegaly Pulses: 2+ and symmetric Skin: Skin color, texture, turgor normal. No rashes or lesions Lymph nodes: Cervical, supraclavicular, and axillary nodes normal.  Assessment and  Plan:  Obesity, morbid, BMI 40.0-49.9 Her BMI remains around 42. 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  She's getting back on track as her staff has developed some comfort with the new software program. Resume 1400-calorie diet and carbohydrate restriction to 60 grams/day and to begin exercising 30 minutes 3 times per week on a recumbent bike as her knees will tolerate.  ESSENTIAL HYPERTENSION Well controlled on current regimen. Renal function stable, no changes today.  DJD (degenerative joint disease) of knee Severe, bilateral., Currently managed with bilateral intra-articular injections by orthopedics. Awaiting knee replacement  but would like to lose weight first.   Updated Medication List Outpatient Encounter Prescriptions as of 07/13/2012  Medication Sig Dispense Refill  . Biotin 300 MCG TABS Take 1 tablet by mouth daily.        [name] CRANBERRY EXTRACT PO 1200 mg a day       . cyclobenzaprine (FLEXERIL) 10 MG tablet TAKE 1/2 - 1 TABLET BY MOUTH 3 TIMES A DAY AS NEEDED FOR BACK PAIN.  180 tablet  2  . diphenhydrAMINE (BENADRYL) 25 MG tablet As needed for allergies.       . diphenoxylate-atropine (LOMOTIL) 2.5-0.025 MG per tablet Take 1 tablet by mouth as needed.      [name] estradiol (ESTRACE) 0.1 MG/GM vaginal cream Place 2 g vaginally daily.      [name] etodolac (LODINE) 500 MG tablet Take 1 tablet (500 mg total) by mouth 2 (two) times daily.  180 tablet  3  . HYDROcodone-acetaminophen (VICODIN) 5-500 MG per tablet Take 1 tablet by mouth two times a day as needed for back pain.       . meclizine (ANTIVERT) 25 MG tablet Take 25 mg by mouth every 6 (six) hours as needed.        . metoprolol succinate (TOPROL-XL) 25 MG 24 hr tablet TAKE 1/2 TABLET BY MOUTH TWICE A DAY  30 tablet  11  . Multiple Vitamin (MULTIVITAMIN) tablet Take 1 tablet by mouth daily.        . Nutritional Supplements (GLUCOSAMINE COMPLEX PO) Take 1 tablet  by mouth daily.        [name] omeprazole (PRILOSEC OTC) 20 MG tablet Take 20 mg by mouth daily.        . potassium chloride SA (K-DUR,KLOR-CON) 20 MEQ tablet Take 20 mEq by mouth as needed. For leg cramps      . traMADol (ULTRAM) 50 MG tablet Take 50 mg by mouth every 6 (six) hours as needed.      . [DISCONTINUED] cyclobenzaprine (FLEXERIL) 10 MG tablet TAKE 1/2 - 1 TABLET BY MOUTH 3 TIMES A DAY AS NEEDED FOR BACK PAIN.  60 tablet  2  . [DISCONTINUED] etodolac (LODINE) 500 MG tablet Take 500 mg by mouth 2 (two) times daily.  No facility-administered encounter medications on file as of 07/13/2012.

## 2012-07-15 ENCOUNTER — Encounter: Payer: Self-pay | Admitting: Internal Medicine

## 2012-07-15 DIAGNOSIS — M1710 Unilateral primary osteoarthritis, unspecified knee: Secondary | ICD-10-CM | POA: Insufficient documentation

## 2012-07-15 NOTE — Assessment & Plan Note (Signed)
Well controlled on current regimen. Renal function stable, no changes today. 

## 2012-07-15 NOTE — Assessment & Plan Note (Signed)
Her BMI remains around 42. 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  She's getting back on track and other staff has developed some comfort with the new software program. Resume 4000-calorie diet and carbohydrate restricted diet and to begin exercising as her knees will tolerate.

## 2012-07-15 NOTE — Assessment & Plan Note (Signed)
Severe, bilateral., Currently managed with bilateral intra-articular injections by orthopedics. Awaiting knee replacement  but would like to lose weight first.

## 2012-12-30 ENCOUNTER — Telehealth: Payer: Self-pay | Admitting: Internal Medicine

## 2012-12-30 ENCOUNTER — Other Ambulatory Visit: Payer: Self-pay | Admitting: Internal Medicine

## 2012-12-30 DIAGNOSIS — I471 Supraventricular tachycardia: Secondary | ICD-10-CM

## 2012-12-30 NOTE — Telephone Encounter (Signed)
Please hold the 10:30 slot this morning  for [facility].  Reason:  SVT  ,.  Needs EKG and labs  upon arrival

## 2012-12-30 NOTE — Assessment & Plan Note (Signed)
Episode lasted approx 30 to 45 minutes.   Ht 160,  No chest pain or presyncope.,  Prior episode occurred about 3 years ago, also occurred on a Sunday

## 2012-12-31 ENCOUNTER — Other Ambulatory Visit: Payer: Self-pay | Admitting: Internal Medicine

## 2012-12-31 ENCOUNTER — Encounter: Payer: Self-pay | Admitting: Internal Medicine

## 2012-12-31 ENCOUNTER — Ambulatory Visit (INDEPENDENT_AMBULATORY_CARE_PROVIDER_SITE_OTHER): Payer: Commercial Managed Care - PPO | Admitting: Internal Medicine

## 2012-12-31 VITALS — BP 138/84 | HR 106 | Temp 98.6°F | Resp 14 | Ht 68.0 in | Wt 283.8 lb

## 2012-12-31 DIAGNOSIS — I498 Other specified cardiac arrhythmias: Secondary | ICD-10-CM

## 2012-12-31 DIAGNOSIS — I471 Supraventricular tachycardia: Secondary | ICD-10-CM

## 2012-12-31 LAB — CK TOTAL AND CKMB (NOT AT ARMC)
CK, MB: 2.1 ng/mL (ref 0.3–4.0)
Total CK: 56 U/L (ref 7–177)

## 2012-12-31 LAB — BASIC METABOLIC PANEL
BUN: 18 mg/dL (ref 6–23)
CO2: 25 mEq/L (ref 19–32)
Calcium: 8.7 mg/dL (ref 8.4–10.5)
Chloride: 105 mEq/L (ref 96–112)
Creatinine, Ser: 0.9 mg/dL (ref 0.4–1.2)
GFR: 72.71 mL/min (ref >60.00)
Glucose, Bld: 107 mg/dL — ABNORMAL HIGH (ref 70–99)
Potassium: 4 mEq/L (ref 3.5–5.1)
Sodium: 137 mEq/L (ref 135–145)

## 2012-12-31 LAB — CBC WITH DIFFERENTIAL/PLATELET
Basophils Absolute: 0 10*3/uL (ref 0.0–0.1)
Basophils Relative: 0.7 % (ref 0.0–3.0)
Eosinophils Absolute: 0.1 10*3/uL (ref 0.0–0.7)
Eosinophils Relative: 1 % (ref 0.0–5.0)
HCT: 39.7 % (ref 36.0–46.0)
Hemoglobin: 13.4 g/dL (ref 12.0–15.0)
Lymphocytes Relative: 26 % (ref 12.0–46.0)
Lymphs Abs: 1.9 10*3/uL (ref 0.7–4.0)
MCHC: 33.7 g/dL (ref 30.0–36.0)
MCV: 83.8 fl (ref 78.0–100.0)
Monocytes Absolute: 0.5 10*3/uL (ref 0.1–1.0)
Monocytes Relative: 7.3 % (ref 3.0–12.0)
Neutro Abs: 4.8 10*3/uL (ref 1.4–7.7)
Neutrophils Relative %: 65 % (ref 43.0–77.0)
Platelets: 191 10*3/uL (ref 150.0–400.0)
RBC: 4.74 Mil/uL (ref 3.87–5.11)
RDW: 13.5 % (ref 11.5–14.6)
WBC: 7.4 10*3/uL (ref 4.5–10.5)

## 2012-12-31 LAB — MAGNESIUM: Magnesium: 1.8 mg/dL (ref 1.5–2.5)

## 2012-12-31 LAB — TROPONIN I: Troponin I: 0.08 ng/mL — ABNORMAL HIGH (ref <0.06)

## 2012-12-31 NOTE — Assessment & Plan Note (Addendum)
Still mildly tachycardic.,  Increase toprol xl to 50 mg daily , will take 25 bid  There is new T wave inversion in lead V2 not seen on prior EKg.  Labs including  Mg and K are normal ,  Troponin I is .0.08  .  Referral to Dr Lady Gary for stress testing.

## 2012-12-31 NOTE — Progress Notes (Signed)
Patient ID: Tiffany Wells, female   DOB: Sep 17, 1955, 57 y.o.   MRN: [id number]  Patient Active Problem List   Diagnosis Date Noted  . SVT (supraventricular tachycardia) 12/30/2012  . DJD (degenerative joint disease) of knee 07/15/2012  . Obesity, morbid, BMI 40.0-49.9 03/20/2012  . Flushing reaction 03/20/2012  . Internal hemorrhoids   . History of Salmonella gastroenteritis   . Hiatal hernia 03/16/2011  . ESSENTIAL HYPERTENSION 02/04/2010    Subjective:  CC:   No chief complaint on file.   HPI:   Tiffany Wells a 57 y.o. female who presents after a recent episode of sustained tachycardia which occurred 24 hours ago.  Rate as high as 160 noted yesterday after attending church services.  Lasted about an hour,  Chest felt tired ofr a few hours afterward but she denies diaphoresis, dyspnea or  Nausea.  No recent stressors,  Sleeping well,  No use of decongestants.  No clear trigger    Past Medical History  Diagnosis Date  . Back pain   . Hypertension   . Normal cardiac stress test     2006, nromal stress cardiolite, EF 65%  . Internal hemorrhoids   . History of Salmonella gastroenteritis 2012    Past Surgical History  Procedure Laterality Date  . Rotator cuff repair    . Tubal ligation    . Colonoscopy  03/07/2011    Int Hemms  . Hysteroscopy    . Upper gastrointestinal endoscopy  03/07/2011    [facility] Multip Gastric Polyps/ Dr Mechele Collin       The following portions of the patient's history were reviewed and updated as appropriate: Allergies, current medications, and problem list.    Review of Systems:   12 Pt  review of systems was negative except those addressed in the HPI,     History   Social History  . Marital Status: Married    Spouse Name: N/A    Number of Children: 2  . Years of Education: N/A   Occupational History  . Print production planner, [employer]    Social History Main Topics  . Smoking status: Never Smoker   . Smokeless tobacco:  Never Used  . Alcohol Use: Yes     Comment: very rare  . Drug Use: No  . Sexual Activity: Not on file   Other Topics Concern  . Not on file   Social History Narrative   Lives with husband.    Objective:  Filed Vitals:   12/31/12 1038  BP: 138/84  Pulse: 106  Temp: 98.6 F (37 C)  Resp: 14     General appearance: alert, cooperative and appears stated age Ears: normal TM's and external ear canals both ears Throat: lips, mucosa, and tongue normal; teeth and gums normal Neck: no adenopathy, no carotid bruit, supple, symmetrical, trachea midline and thyroid not enlarged, symmetric, no tenderness/mass/nodules Back: symmetric, no curvature. ROM normal. No CVA tenderness. Lungs: clear to auscultation bilaterally Heart: regular rate and rhythm, S1, S2 normal, no murmur, click, rub or gallop Abdomen: soft, non-tender; bowel sounds normal; no masses,  no organomegaly Pulses: 2+ and symmetric Skin: Skin color, texture, turgor normal. No rashes or lesions Lymph nodes: Cervical, supraclavicular, and axillary nodes normal.  Assessment and Plan:  SVT (supraventricular tachycardia) Still mildly tachycardic.,  Increase toprol xl to 50 mg daily , will take 25 bid  There is new T wave inversion in lead V2 not seen on prior EKg.  Labs including  Mg and K  are normal ,  Troponin I is .0.08  .  Referral to Dr Lady Gary for stress testing.    Updated Medication List Outpatient Encounter Prescriptions as of 12/31/2012  Medication Sig Dispense Refill  . Biotin 300 MCG TABS Take 1 tablet by mouth daily.        [name] CRANBERRY EXTRACT PO 1200 mg a day       . cyclobenzaprine (FLEXERIL) 10 MG tablet TAKE 1/2 - 1 TABLET BY MOUTH 3 TIMES A DAY AS NEEDED FOR BACK PAIN.  180 tablet  2  . diphenhydrAMINE (BENADRYL) 25 MG tablet As needed for allergies.       . diphenoxylate-atropine (LOMOTIL) 2.5-0.025 MG per tablet Take 1 tablet by mouth as needed.      [name] estradiol (ESTRACE) 0.1 MG/GM vaginal cream Place 2 g  vaginally daily.      [name] etodolac (LODINE) 500 MG tablet Take 1 tablet (500 mg total) by mouth 2 (two) times daily.  180 tablet  3  . HYDROcodone-acetaminophen (VICODIN) 5-500 MG per tablet Take 1 tablet by mouth two times a day as needed for back pain.       . meclizine (ANTIVERT) 25 MG tablet Take 25 mg by mouth every 6 (six) hours as needed.        . metoprolol succinate (TOPROL-XL) 25 MG 24 hr tablet TAKE 1/2 TABLET BY MOUTH TWICE A DAY  30 tablet  11  . Multiple Vitamin (MULTIVITAMIN) tablet Take 1 tablet by mouth daily.        [name] omeprazole (PRILOSEC OTC) 20 MG tablet Take 20 mg by mouth daily.        . potassium chloride SA (K-DUR,KLOR-CON) 20 MEQ tablet Take 20 mEq by mouth as needed. For leg cramps      . traMADol (ULTRAM) 50 MG tablet Take 50 mg by mouth every 6 (six) hours as needed.      . Nutritional Supplements (GLUCOSAMINE COMPLEX PO) Take 1 tablet by mouth daily.         No facility-administered encounter medications on file as of 12/31/2012.     Orders Placed This Encounter  Procedures  . EKG 12-Lead    No Follow-up on file.

## 2012-12-31 NOTE — Telephone Encounter (Signed)
EKG in system will pend until arrival.

## 2012-12-31 NOTE — Telephone Encounter (Signed)
Appointment made

## 2013-01-01 LAB — CK TOTAL AND CKMB (NOT AT ARMC)
CK, MB: 2.3 ng/mL (ref 0.3–4.0)
Total CK: 59 U/L (ref 7–177)

## 2013-01-01 LAB — TROPONIN I: Troponin I: 0.06 ng/mL — ABNORMAL HIGH (ref <0.06)

## 2013-01-01 LAB — TSH+FREE T4
Free T4: 1.25 ng/dL (ref 0.82–1.77)
TSH: 2.87 u[IU]/mL (ref 0.450–4.500)

## 2013-01-23 ENCOUNTER — Telehealth: Payer: Self-pay | Admitting: Internal Medicine

## 2013-01-23 MED ORDER — METOPROLOL SUCCINATE ER 25 MG PO TB24: 25.0000 mg | ORAL_TABLET | Freq: Two times a day (BID) | ORAL | Status: DC

## 2013-01-23 NOTE — Telephone Encounter (Signed)
Pt states that she has tried calling twice to get an rx refill for her Metroperol. She sais that Dr. Darrick Huntsman has change her dosage to 50 mg and she uses [facility] [address].

## 2013-01-23 NOTE — Telephone Encounter (Signed)
Rx sent with new directions 25 mg bid per Dr. Melina Schools note at 12/31/12 visit. Pt notified.

## 2013-02-22 ENCOUNTER — Other Ambulatory Visit: Payer: Self-pay | Admitting: *Deleted

## 2013-02-22 ENCOUNTER — Other Ambulatory Visit: Payer: Self-pay | Admitting: General Surgery

## 2013-02-22 DIAGNOSIS — N390 Urinary tract infection, site not specified: Secondary | ICD-10-CM

## 2013-02-24 LAB — URINE CULTURE

## 2013-03-08 ENCOUNTER — Other Ambulatory Visit: Payer: Self-pay | Admitting: *Deleted

## 2013-03-08 DIAGNOSIS — N390 Urinary tract infection, site not specified: Secondary | ICD-10-CM

## 2013-03-08 MED ORDER — NITROFURANTOIN MONOHYD MACRO 100 MG PO CAPS: 100.0000 mg | ORAL_CAPSULE | Freq: Two times a day (BID) | ORAL | Status: DC

## 2013-03-08 NOTE — Progress Notes (Signed)
Prescription for Macrobid 100 mg one po bid #20 one refill sent to patient's pharmacy.

## 2013-03-31 ENCOUNTER — Other Ambulatory Visit: Payer: Self-pay | Admitting: Internal Medicine

## 2013-04-01 NOTE — Telephone Encounter (Signed)
Ok refill? 

## 2013-05-03 ENCOUNTER — Encounter (INDEPENDENT_AMBULATORY_CARE_PROVIDER_SITE_OTHER): Payer: Self-pay

## 2013-05-03 ENCOUNTER — Ambulatory Visit (INDEPENDENT_AMBULATORY_CARE_PROVIDER_SITE_OTHER): Payer: 59 | Admitting: Internal Medicine

## 2013-05-03 ENCOUNTER — Encounter: Payer: Self-pay | Admitting: Internal Medicine

## 2013-05-03 VITALS — BP 154/100 | HR 102 | Temp 98.1°F | Resp 16 | Ht 68.0 in | Wt 290.5 lb

## 2013-05-03 DIAGNOSIS — R5381 Other malaise: Secondary | ICD-10-CM

## 2013-05-03 DIAGNOSIS — M179 Osteoarthritis of knee, unspecified: Secondary | ICD-10-CM

## 2013-05-03 DIAGNOSIS — I471 Supraventricular tachycardia: Secondary | ICD-10-CM

## 2013-05-03 DIAGNOSIS — R5383 Other fatigue: Principal | ICD-10-CM

## 2013-05-03 DIAGNOSIS — I10 Essential (primary) hypertension: Secondary | ICD-10-CM

## 2013-05-03 DIAGNOSIS — E559 Vitamin D deficiency, unspecified: Secondary | ICD-10-CM

## 2013-05-03 DIAGNOSIS — I498 Other specified cardiac arrhythmias: Secondary | ICD-10-CM

## 2013-05-03 DIAGNOSIS — IMO0002 Reserved for concepts with insufficient information to code with codable children: Secondary | ICD-10-CM

## 2013-05-03 DIAGNOSIS — Z13220 Encounter for screening for lipoid disorders: Secondary | ICD-10-CM

## 2013-05-03 DIAGNOSIS — Z0000 Encounter for general adult medical examination without abnormal findings: Secondary | ICD-10-CM

## 2013-05-03 DIAGNOSIS — M1710 Unilateral primary osteoarthritis, unspecified knee: Secondary | ICD-10-CM

## 2013-05-03 LAB — HM PAP SMEAR: HM Pap smear: NORMAL

## 2013-05-03 MED ORDER — PREDNISONE 20 MG PO TABS: 20.0000 mg | ORAL_TABLET | Freq: Every day | ORAL | Status: DC

## 2013-05-03 NOTE — Patient Instructions (Addendum)
1) suspend the lodine for a week  To see how the bp responds.  Continue 50 mg  toprol   If bp does not improve,  We can increase the toprol to to 3 tablets daily  For BP (toal of 75 mg once daily )   Use 20 mg prednisone daily for inflammation  During that week   Return for fasting labs including  Vit D in 2 weeks   Ok to do Shingles vaccine 2 weeks after prednisone taper   Check whether you had TDaP vaccine

## 2013-05-03 NOTE — Progress Notes (Signed)
Patient ID: Tiffany Wells, female   DOB: 08/02/1955, 58 y.o.   MRN: [id number]    Subjective:     Tiffany Wells is a 58 y.o. female and is here for a comprehensive physical exam. The patient reports problems - with knee pain and weight gain. [name] Her PAP smear done today by GYN,.  Mammogram has been ordered  Bilateral  knee pain secondary to severe DJD knees.  Managed with   Daily vicodin,  tramadol and daily etodolac   Elevated blood pressure .  Her first elevated readings were noted after she began using daily NSAIDs after a sciatic injury in 2008. lyrica and celebrex trial   Annual eye exam next week by [facility],  Normal thus far, no glaucoma  History   Social History  . Marital Status: Married    Spouse Name: N/A    Number of Children: 2  . Years of Education: N/A   Occupational History  . Glass blower/designer, [employer] History Main Topics  . Smoking status: Never Smoker   . Smokeless tobacco: Never Used  . Alcohol Use: 1.2 oz/week    2 Glasses of wine per week     Comment: very rare  . Drug Use: No  . Sexual Activity: Not on file   Other Topics Concern  . Not on file   Social History Narrative   Lives with husband.   Health Maintenance  Topic Date Due  . Pap Smear  10/22/1971  . Influenza Vaccine  11/16/2013  . Mammogram  04/24/2014  . Tetanus/tdap  04/26/2020  . Colonoscopy  03/06/2021    The following portions of the patient's history were reviewed and updated as appropriate: allergies, current medications, past family history, past medical history, past social history, past surgical history and problem list.  Review of Systems A comprehensive review of systems was negative.   Objective:   General appearance: alert, cooperative and appears stated age Ears: normal TM's and external ear canals both ears Throat: lips, mucosa, and tongue normal; teeth and gums normal Neck: no adenopathy, no carotid bruit, supple, symmetrical, trachea midline  and thyroid not enlarged, symmetric, no tenderness/mass/nodules Back: symmetric, no curvature. ROM normal. No CVA tenderness. Lungs: clear to auscultation bilaterally Heart: regular rate and rhythm, S1, S2 normal, no murmur, click, rub or gallop Abdomen: soft, non-tender; bowel sounds normal; no masses,  no organomegaly Pulses: 2+ and symmetric Skin: Skin color, texture, turgor normal. No rashes or lesions Lymph nodes: Cervical, supraclavicular, and axillary nodes normal.   Assessment and Plan:   DJD (degenerative joint disease) of knee She has been delaying surgery in an effort to lose weight prior to surgery. She's going to discuss the benefits of doing surgery on both knees sooner raher than later with her orthopedist since she is unable to exercise due to her severe bilateral knee pain.  ESSENTIAL HYPERTENSION Her recent elevations may be due to daily use of nonsteroidal anti-inflammatories we discussed suspending Lodine for a week and using a prednisone at low dose 20 mg daily to see of her blood pressures improve  Obesity, unspecified I have addressed  BMI and recommended wt loss of 10% of body weight over the next 6 months using a low glycemic index diet and regular exercise a minimum of 5 days per week. She has had transient success with low glycemic index diet but lacks the ability to exercise due to severe bilateral knee joint pain.  SVT (supraventricular tachycardia) Suggested that  she increase the Toprol to 75 mg daily instead of 25 mg twice daily.      Encounter for preventive health examination Annual comprehensive exam was done  excluding breast, pelvic and PAP smear. All screenings have been addressed .    Updated Medication List Outpatient Encounter Prescriptions as of 05/03/2013  Medication Sig  . Biotin 300 MCG TABS Take 1 tablet by mouth daily.    [name] CRANBERRY EXTRACT PO 1200 mg a day   . cyclobenzaprine (FLEXERIL) 10 MG tablet TAKE 1/2 - 1 TABLET BY MOUTH 3 TIMES  A DAY AS NEEDED FOR BACK PAIN.  . diphenhydrAMINE (BENADRYL) 25 MG tablet As needed for allergies.   . diphenoxylate-atropine (LOMOTIL) 2.5-0.025 MG per tablet Take 1 tablet by mouth as needed.  [name] estradiol (ESTRACE) 0.1 MG/GM vaginal cream Place 2 g vaginally daily.  [name] etodolac (LODINE) 500 MG tablet TAKE 1 TABLET (500 MG TOTAL) BY MOUTH 2 (TWO) TIMES DAILY.  [name] HYDROcodone-acetaminophen (VICODIN) 5-500 MG per tablet Take 1 tablet by mouth two times a day as needed for back pain.   . meclizine (ANTIVERT) 25 MG tablet Take 25 mg by mouth every 6 (six) hours as needed.    . metoprolol succinate (TOPROL-XL) 25 MG 24 hr tablet Take 1 tablet (25 mg total) by mouth 2 (two) times daily.  . Multiple Vitamin (MULTIVITAMIN) tablet Take 1 tablet by mouth daily.    [name] omeprazole (PRILOSEC OTC) 20 MG tablet Take 20 mg by mouth daily.    . traMADol (ULTRAM) 50 MG tablet Take 50 mg by mouth every 6 (six) hours as needed.  . nitrofurantoin, macrocrystal-monohydrate, (MACROBID) 100 MG capsule Take 1 capsule (100 mg total) by mouth 2 (two) times daily.  . Nutritional Supplements (GLUCOSAMINE COMPLEX PO) Take 1 tablet by mouth daily.    . potassium chloride SA (K-DUR,KLOR-CON) 20 MEQ tablet Take 20 mEq by mouth as needed. For leg cramps  . predniSONE (DELTASONE) 20 MG tablet Take 1 tablet (20 mg total) by mouth daily with breakfast.

## 2013-05-05 ENCOUNTER — Encounter: Payer: Self-pay | Admitting: Internal Medicine

## 2013-05-05 DIAGNOSIS — Z0000 Encounter for general adult medical examination without abnormal findings: Secondary | ICD-10-CM | POA: Insufficient documentation

## 2013-05-05 NOTE — Assessment & Plan Note (Signed)
Annual comprehensive exam was done excluding breast, pelvic and PAP smear. All screenings have been addressed .  

## 2013-05-05 NOTE — Assessment & Plan Note (Signed)
She has been delaying surgery in an effort to lose weight prior to surgery. She's going to discuss the benefits of doing surgery on both knees sooner raher than later with her orthopedist since she is unable to exercise due to her severe bilateral knee pain.

## 2013-05-05 NOTE — Assessment & Plan Note (Signed)
Her recent elevations may be due to daily use of nonsteroidal anti-inflammatories we discussed suspending Lodine for a week and using a prednisone at low dose 20 mg daily to see of her blood pressures improve

## 2013-05-05 NOTE — Assessment & Plan Note (Signed)
I have addressed  BMI and recommended wt loss of 10% of body weight over the next 6 months using a low glycemic index diet and regular exercise a minimum of 5 days per week. She has had transient success with low glycemic index diet but lacks the ability to exercise due to severe bilateral knee joint pain.

## 2013-05-05 NOTE — Assessment & Plan Note (Signed)
Suggested that she increase the Toprol to 75 mg daily instead of 25 mg twice daily.

## 2013-05-06 ENCOUNTER — Encounter: Payer: Self-pay | Admitting: Internal Medicine

## 2013-05-07 MED ORDER — PREDNISONE 20 MG PO TABS: 20.0000 mg | ORAL_TABLET | Freq: Every day | ORAL | Status: DC

## 2013-05-07 MED ORDER — HYDROCOD POLST-CHLORPHEN POLST 10-8 MG/5ML PO LQCR: 5.0000 mL | Freq: Two times a day (BID) | ORAL | Status: DC | PRN

## 2013-05-07 MED ORDER — PREDNISONE (PAK) 10 MG PO TABS: ORAL_TABLET | ORAL | Status: DC

## 2013-05-09 ENCOUNTER — Encounter: Payer: Self-pay | Admitting: Internal Medicine

## 2013-05-10 ENCOUNTER — Encounter: Payer: Self-pay | Admitting: Internal Medicine

## 2013-05-17 ENCOUNTER — Other Ambulatory Visit: Payer: 59

## 2013-05-20 ENCOUNTER — Ambulatory Visit (INDEPENDENT_AMBULATORY_CARE_PROVIDER_SITE_OTHER): Payer: 59 | Admitting: Internal Medicine

## 2013-05-20 ENCOUNTER — Encounter: Payer: Self-pay | Admitting: Internal Medicine

## 2013-05-20 VITALS — BP 122/72 | HR 110 | Temp 99.4°F | Resp 12 | Wt 284.0 lb

## 2013-05-20 DIAGNOSIS — M179 Osteoarthritis of knee, unspecified: Secondary | ICD-10-CM

## 2013-05-20 DIAGNOSIS — M1710 Unilateral primary osteoarthritis, unspecified knee: Secondary | ICD-10-CM

## 2013-05-20 DIAGNOSIS — IMO0002 Reserved for concepts with insufficient information to code with codable children: Secondary | ICD-10-CM

## 2013-05-20 DIAGNOSIS — J0100 Acute maxillary sinusitis, unspecified: Secondary | ICD-10-CM

## 2013-05-20 MED ORDER — AMOXICILLIN-POT CLAVULANATE 875-125 MG PO TABS: 1.0000 | ORAL_TABLET | Freq: Two times a day (BID) | ORAL | Status: DC

## 2013-05-20 MED ORDER — BENZONATATE 100 MG PO CAPS: 200.0000 mg | ORAL_CAPSULE | Freq: Three times a day (TID) | ORAL | Status: DC | PRN

## 2013-05-20 NOTE — Progress Notes (Signed)
Patient ID: Tiffany Wells, female   DOB: Apr 10, 1956, 58 y.o.   MRN: [id number]   Patient Active Problem List   Diagnosis Date Noted  . Sinusitis, acute maxillary 05/22/2013  . Encounter for preventive health examination 05/05/2013  . SVT (supraventricular tachycardia) 12/30/2012  . DJD (degenerative joint disease) of knee 07/15/2012  . Obesity, unspecified 03/20/2012  . Flushing reaction 03/20/2012  . Internal hemorrhoids   . History of Salmonella gastroenteritis   . Hiatal hernia 03/16/2011  . ESSENTIAL HYPERTENSION 02/04/2010    Subjective:  CC:   Chief Complaint  Patient presents with  . Cough    nasal drainage, cough    HPI:   Tiffany Wells a 58 y.o. female who presents for worsening cough accompanied by facial pain .  She finished prednisone taper prescribed for treatment of post viral syndrome cough and cough started to improve. Then Friday started to develop rhinitis and purulent drainage from the right side.  Now left side is involved as well and she feels generally miserable. No recent travel. Many multiple sick contacts.    Past Medical History  Diagnosis Date  . Back pain   . Hypertension   . Normal cardiac stress test     2006, nromal stress cardiolite, EF 65%  . Internal hemorrhoids   . History of Salmonella gastroenteritis 2012    Past Surgical History  Procedure Laterality Date  . Rotator cuff repair    . Tubal ligation    . Colonoscopy  03/07/2011    Int Hemms  . Hysteroscopy    . Upper gastrointestinal endoscopy  03/07/2011    HH Multip Gastric Polyps/ Dr Vira Agar       The following portions of the patient's history were reviewed and updated as appropriate: Allergies, current medications, and problem list.    Review of Systems:   12 Pt  review of systems was negative except those addressed in the HPI,     History   Social History  . Marital Status: Married    Spouse Name: N/A    Number of Children: 2  . Years of  Education: N/A   Occupational History  . Glass blower/designer, [employer] History Main Topics  . Smoking status: Never Smoker   . Smokeless tobacco: Never Used  . Alcohol Use: 1.2 oz/week    2 Glasses of wine per week     Comment: very rare  . Drug Use: No  . Sexual Activity: Not on file   Other Topics Concern  . Not on file   Social History Narrative   Lives with husband.    Objective:  Filed Vitals:   05/20/13 1800  BP: 122/72  Pulse: 110  Temp: 99.4 F (37.4 C)  Resp: 12     General appearance: alert, cooperative and appears stated age Ears: normal TM's and external ear canals both ears Throat: lips, mucosa, and tongue normal; teeth and gums normal Neck: no adenopathy, no carotid bruit, supple, symmetrical, trachea midline and thyroid not enlarged, symmetric, no tenderness/mass/nodules Back: symmetric, no curvature. ROM normal. No CVA tenderness. Lungs: clear to auscultation bilaterally Heart: regular rate and rhythm, S1, S2 normal, no murmur, click, rub or gallop Abdomen: soft, non-tender; bowel sounds normal; no masses,  no organomegaly Pulses: 2+ and symmetric Skin: Skin color, texture, turgor normal. No rashes or lesions Lymph nodes: Cervical, supraclavicular, and axillary nodes normal.  Assessment and Plan:  DJD (degenerative joint disease) of knee She is scheduled to  have the left knee joint replaced February 26th.   Sinusitis, acute maxillary Given chronicity of symptoms, development of facial pain and exam consistent with bacterial URI,  Will treat with empiric antibiotics, decongestants, and saline lavage.    Updated Medication List Outpatient Encounter Prescriptions as of 05/20/2013  Medication Sig  . Biotin 300 MCG TABS Take 1 tablet by mouth daily.    [name] CRANBERRY EXTRACT PO 1200 mg a day   . cyclobenzaprine (FLEXERIL) 10 MG tablet TAKE 1/2 - 1 TABLET BY MOUTH 3 TIMES A DAY AS NEEDED FOR BACK PAIN.  . diphenhydrAMINE (BENADRYL) 25 MG  tablet As needed for allergies.   . diphenoxylate-atropine (LOMOTIL) 2.5-0.025 MG per tablet Take 1 tablet by mouth as needed.  [name] estradiol (ESTRACE) 0.1 MG/GM vaginal cream Place 2 g vaginally daily.  [name] HYDROcodone-acetaminophen (VICODIN) 5-500 MG per tablet Take 1 tablet by mouth two times a day as needed for back pain.   . meclizine (ANTIVERT) 25 MG tablet Take 25 mg by mouth every 6 (six) hours as needed.    . metoprolol succinate (TOPROL-XL) 25 MG 24 hr tablet Take 1 tablet (25 mg total) by mouth 2 (two) times daily.  . Multiple Vitamin (MULTIVITAMIN) tablet Take 1 tablet by mouth daily.    [name] omeprazole (PRILOSEC OTC) 20 MG tablet Take 20 mg by mouth daily.    . traMADol (ULTRAM) 50 MG tablet Take 50 mg by mouth every 6 (six) hours as needed.  [name] amoxicillin-clavulanate (AUGMENTIN) 875-125 MG per tablet Take 1 tablet by mouth 2 (two) times daily.  . benzonatate (TESSALON PERLES) 100 MG capsule Take 2 capsules (200 mg total) by mouth 3 (three) times daily as needed for cough.  . chlorpheniramine-HYDROcodone (TUSSIONEX) 10-8 MG/5ML LQCR Take 5 mLs by mouth every 12 (twelve) hours as needed for cough.  . potassium chloride SA (K-DUR,KLOR-CON) 20 MEQ tablet Take 20 mEq by mouth as needed. For leg cramps  . [DISCONTINUED] etodolac (LODINE) 500 MG tablet TAKE 1 TABLET (500 MG TOTAL) BY MOUTH 2 (TWO) TIMES DAILY.  . [DISCONTINUED] nitrofurantoin, macrocrystal-monohydrate, (MACROBID) 100 MG capsule Take 1 capsule (100 mg total) by mouth 2 (two) times daily.  . [DISCONTINUED] Nutritional Supplements (GLUCOSAMINE COMPLEX PO) Take 1 tablet by mouth daily.    . [DISCONTINUED] predniSONE (DELTASONE) 20 MG tablet Take 1 tablet (20 mg total) by mouth daily with breakfast.  . [DISCONTINUED] predniSONE (STERAPRED UNI-PAK) 10 MG tablet 6 tablets on Day 1 , then reduce by 1 tablet daily until gone     No orders of the defined types were placed in this encounter.    No Follow-up on file.

## 2013-05-20 NOTE — Patient Instructions (Signed)
You have a sinus/ear infection   .  I am prescribing an antibiotic (augmentin)  I also advise use of the following OTC meds to help with your other symptoms.   Take generic OTC benadryl 25 mg every 8 hours for the drainage,  Sudafed PE  10 to 30 mg every 8 hours for the congestion, or you may substitute Afrin nasal spray twice daily if the sudafed increases your blood pressure or if the nighttime dose of sudafed PE  If needed to prevent insomnia.  flushes your sinuses once daily with Neil's sinus rinse (do this over the sink because if you do it right you will spit out globs of mucus)  Use benzonatate capsules    FOR THE COUGH.  Gargle with salt water as needed for sore throat.

## 2013-05-20 NOTE — Progress Notes (Signed)
Pre visit review using our clinic review tool, if applicable. No additional management support is needed unless otherwise documented below in the visit note. 

## 2013-05-22 ENCOUNTER — Encounter: Payer: Self-pay | Admitting: Internal Medicine

## 2013-05-22 DIAGNOSIS — J0100 Acute maxillary sinusitis, unspecified: Secondary | ICD-10-CM | POA: Insufficient documentation

## 2013-05-22 NOTE — Assessment & Plan Note (Signed)
Given chronicity of symptoms, development of facial pain and exam consistent with bacterial URI,  Will treat with empiric antibiotics, decongestants, and saline lavage.   

## 2013-05-22 NOTE — Assessment & Plan Note (Signed)
She is scheduled to have the left knee joint replaced February 26th.

## 2013-05-28 ENCOUNTER — Other Ambulatory Visit (INDEPENDENT_AMBULATORY_CARE_PROVIDER_SITE_OTHER): Payer: 59

## 2013-05-28 DIAGNOSIS — R5383 Other fatigue: Principal | ICD-10-CM

## 2013-05-28 DIAGNOSIS — R5381 Other malaise: Secondary | ICD-10-CM

## 2013-05-28 DIAGNOSIS — Z13220 Encounter for screening for lipoid disorders: Secondary | ICD-10-CM

## 2013-05-28 DIAGNOSIS — E559 Vitamin D deficiency, unspecified: Secondary | ICD-10-CM

## 2013-05-28 LAB — CBC WITH DIFFERENTIAL/PLATELET
Basophils Absolute: 0 10*3/uL (ref 0.0–0.1)
Basophils Relative: 0.6 % (ref 0.0–3.0)
Eosinophils Absolute: 0.2 10*3/uL (ref 0.0–0.7)
Eosinophils Relative: 3.2 % (ref 0.0–5.0)
HCT: 40.2 % (ref 36.0–46.0)
Hemoglobin: 13.3 g/dL (ref 12.0–15.0)
Lymphocytes Relative: 27.9 % (ref 12.0–46.0)
Lymphs Abs: 1.9 10*3/uL (ref 0.7–4.0)
MCHC: 32.9 g/dL (ref 30.0–36.0)
MCV: 85.6 fl (ref 78.0–100.0)
Monocytes Absolute: 0.4 10*3/uL (ref 0.1–1.0)
Monocytes Relative: 5.7 % (ref 3.0–12.0)
Neutro Abs: 4.3 10*3/uL (ref 1.4–7.7)
Neutrophils Relative %: 62.6 % (ref 43.0–77.0)
Platelets: 197 10*3/uL (ref 150.0–400.0)
RBC: 4.7 Mil/uL (ref 3.87–5.11)
RDW: 13.4 % (ref 11.5–14.6)
WBC: 6.9 10*3/uL (ref 4.5–10.5)

## 2013-05-28 LAB — COMPREHENSIVE METABOLIC PANEL
ALT: 18 U/L (ref 0–35)
AST: 18 U/L (ref 0–37)
Albumin: 3.5 g/dL (ref 3.5–5.2)
Alkaline Phosphatase: 52 U/L (ref 39–117)
BUN: 12 mg/dL (ref 6–23)
CO2: 29 mEq/L (ref 19–32)
Calcium: 8.7 mg/dL (ref 8.4–10.5)
Chloride: 107 mEq/L (ref 96–112)
Creatinine, Ser: 0.8 mg/dL (ref 0.4–1.2)
GFR: 76.76 mL/min (ref >60.00)
Glucose, Bld: 131 mg/dL — ABNORMAL HIGH (ref 70–99)
Potassium: 4.3 mEq/L (ref 3.5–5.1)
Sodium: 143 mEq/L (ref 135–145)
Total Bilirubin: 0.7 mg/dL (ref 0.3–1.2)
Total Protein: 6.4 g/dL (ref 6.0–8.3)

## 2013-05-28 LAB — LIPID PANEL
Cholesterol: 199 mg/dL (ref 0–200)
HDL: 33.4 mg/dL — ABNORMAL LOW (ref >39.00)
LDL Cholesterol: 126 mg/dL — ABNORMAL HIGH (ref 0–99)
Total CHOL/HDL Ratio: 6
Triglycerides: 198 mg/dL — ABNORMAL HIGH (ref 0.0–149.0)
VLDL: 39.6 mg/dL (ref 0.0–40.0)

## 2013-05-28 LAB — TSH: TSH: 2.3 u[IU]/mL (ref 0.35–5.50)

## 2013-05-29 ENCOUNTER — Encounter: Payer: Self-pay | Admitting: Internal Medicine

## 2013-05-29 LAB — VITAMIN D 25 HYDROXY (VIT D DEFICIENCY, FRACTURES): Vit D, 25-Hydroxy: 44 ng/mL (ref 30–89)

## 2013-05-31 ENCOUNTER — Encounter: Payer: Self-pay | Admitting: Internal Medicine

## 2013-05-31 ENCOUNTER — Other Ambulatory Visit: Payer: 59

## 2013-05-31 ENCOUNTER — Ambulatory Visit (INDEPENDENT_AMBULATORY_CARE_PROVIDER_SITE_OTHER): Payer: 59 | Admitting: Internal Medicine

## 2013-05-31 VITALS — BP 138/80 | HR 81 | Temp 97.7°F | Resp 18 | Wt 289.8 lb

## 2013-05-31 DIAGNOSIS — Z01810 Encounter for preprocedural cardiovascular examination: Secondary | ICD-10-CM

## 2013-05-31 DIAGNOSIS — Z01818 Encounter for other preprocedural examination: Secondary | ICD-10-CM

## 2013-05-31 MED ORDER — PB-HYOSCY-ATROPINE-SCOPOL ER 48.6 MG PO TBCR: 1.0000 | EXTENDED_RELEASE_TABLET | Freq: Every day | ORAL | Status: DC | PRN

## 2013-05-31 MED ORDER — IPRATROPIUM BROMIDE 0.06 % NA SOLN: 2.0000 | Freq: Four times a day (QID) | NASAL | Status: DC

## 2013-05-31 MED ORDER — GUAIFENESIN-CODEINE 100-10 MG/5ML PO SYRP: 5.0000 mL | ORAL_SOLUTION | Freq: Three times a day (TID) | ORAL | Status: DC | PRN

## 2013-05-31 NOTE — Patient Instructions (Addendum)
At this point I recommend round the clock cough suppressants to break the cycle   Tessalon or cheratussin for daytime cough,  tussionex for night  If post nasal drip seems to be aggravating the cough,  you can try Atrovent Nasal spray as an alternative to benadryl

## 2013-05-31 NOTE — Progress Notes (Signed)
Pre-visit discussion using our clinic review tool. No additional management support is needed unless otherwise documented below in the visit note.  

## 2013-06-02 ENCOUNTER — Encounter: Payer: Self-pay | Admitting: Internal Medicine

## 2013-06-02 DIAGNOSIS — Z01818 Encounter for other preprocedural examination: Secondary | ICD-10-CM | POA: Insufficient documentation

## 2013-06-02 NOTE — Progress Notes (Signed)
Patient ID: Tiffany Wells, female   DOB: 01-29-1956, 58 y.o.   MRN: [id number]  Patient Active Problem List   Diagnosis Date Noted  . Preoperative clearance 06/02/2013  . Sinusitis, acute maxillary 05/22/2013  . Encounter for preventive health examination 05/05/2013  . SVT (supraventricular tachycardia) 12/30/2012  . DJD (degenerative joint disease) of knee 07/15/2012  . Obesity, unspecified 03/20/2012  . Flushing reaction 03/20/2012  . Internal hemorrhoids   . History of Salmonella gastroenteritis   . Hiatal hernia 03/16/2011  . ESSENTIAL HYPERTENSION 02/04/2010    Subjective:  CC:   Chief Complaint  Patient presents with  . Follow-up    HPI:   Tiffany Wells is a 58 y.o. female who presents for Preoperative medical clearance, requested by her orthopedist, for future right knee replacement She has a history of  SVT but now known history of CAD.  She has had no recent episodes of chest pain and has had a prior cardiology evaluation by Dr. Ubaldo Glassing .     Past Medical History  Diagnosis Date  . Back pain   . Hypertension   . Normal cardiac stress test     2006, nromal stress cardiolite, EF 65%  . Internal hemorrhoids   . History of Salmonella gastroenteritis 2012    Past Surgical History  Procedure Laterality Date  . Rotator cuff repair    . Tubal ligation    . Colonoscopy  03/07/2011    Int Hemms  . Hysteroscopy    . Upper gastrointestinal endoscopy  03/07/2011    HH Multip Gastric Polyps/ Dr Vira Agar       The following portions of the patient's history were reviewed and updated as appropriate: Allergies, current medications, and problem list.    Review of Systems:   Patient denies headache, fevers, malaise, unintentional weight loss, skin rash, eye pain, sinus congestion and sinus pain, sore throat, dysphagia,  hemoptysis , cough, dyspnea, wheezing, chest pain, palpitations, orthopnea, edema, abdominal pain, nausea, melena, diarrhea, constipation, flank  pain, dysuria, hematuria, urinary  Frequency, nocturia, numbness, tingling, seizures,  Focal weakness, Loss of consciousness,  Tremor, insomnia, depression, anxiety, and suicidal ideation.     History   Social History  . Marital Status: Married    Spouse Name: N/A    Number of Children: 2  . Years of Education: N/A   Occupational History  . 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 Smoking status: Never Smoker   . Smokeless tobacco: Never Used  . Alcohol Use: 1.2 oz/week    2 Glasses of wine per week     Comment: very rare  . Drug Use: No  . Sexual Activity: Not on file   Other Topics Concern  . Not on file   Social History Narrative   Lives with husband.    Objective:  Filed Vitals:   05/31/13 1559  BP: 138/80  Pulse: 81  Temp: 97.7 F (36.5 C)  Resp: 18     General appearance: alert, cooperative and appears stated age Ears: normal TM's and external ear canals both ears Throat: lips, mucosa, and tongue normal; teeth and gums normal Neck: no adenopathy, no carotid bruit, supple, symmetrical, trachea midline and thyroid not enlarged, symmetric, no tenderness/mass/nodules Back: symmetric, no curvature. ROM normal. No CVA tenderness. Lungs: clear to auscultation bilaterally Heart: regular rate and rhythm, S1, S2 normal, no murmur, click, rub or gallop Abdomen: soft, non-tender; bowel sounds normal; no masses,  no organomegaly Pulses:  2+ and symmetric Skin: Skin color, texture, turgor normal. No rashes or lesions Lymph nodes: Cervical, supraclavicular, and axillary nodes normal.  Assessment and Plan:  Preoperative clearance Patient  is considered to be at low risk  For perioperative complications based on today's exam and history.  Baseline lytes,  hgb and ekg have been done and are normal and/or unchanged   Updated Medication List Outpatient Encounter Prescriptions as of 05/31/2013  Medication Sig  . chlorpheniramine-HYDROcodone  (TUSSIONEX) 10-8 MG/5ML LQCR Take 5 mLs by mouth every 12 (twelve) hours as needed for cough.  Drusilla Kanner EXTRACT PO 1200 mg a day   . cyclobenzaprine (FLEXERIL) 10 MG tablet TAKE 1/2 - 1 TABLET BY MOUTH 3 TIMES A DAY AS NEEDED FOR BACK PAIN.  . diphenhydrAMINE (BENADRYL) 25 MG tablet As needed for allergies.   . diphenoxylate-atropine (LOMOTIL) 2.5-0.025 MG per tablet Take 1 tablet by mouth as needed.  [name] estradiol (ESTRACE) 0.1 MG/GM vaginal cream Place 2 g vaginally daily.  [name] HYDROcodone-acetaminophen (VICODIN) 5-500 MG per tablet Take 1 tablet by mouth two times a day as needed for back pain.   . meclizine (ANTIVERT) 25 MG tablet Take 25 mg by mouth every 6 (six) hours as needed.    . metoprolol succinate (TOPROL-XL) 25 MG 24 hr tablet Take 1 tablet (25 mg total) by mouth 2 (two) times daily.  . Multiple Vitamin (MULTIVITAMIN) tablet Take 1 tablet by mouth daily.    [name] omeprazole (PRILOSEC OTC) 20 MG tablet Take 20 mg by mouth daily.    . potassium chloride SA (K-DUR,KLOR-CON) 20 MEQ tablet Take 20 mEq by mouth as needed. For leg cramps  . amoxicillin-clavulanate (AUGMENTIN) 875-125 MG per tablet Take 1 tablet by mouth 2 (two) times daily.  . benzonatate (TESSALON PERLES) 100 MG capsule Take 2 capsules (200 mg total) by mouth 3 (three) times daily as needed for cough.  . Biotin 300 MCG TABS Take 1 tablet by mouth daily.    [name] guaiFENesin-codeine (ROBITUSSIN AC) 100-10 MG/5ML syrup Take 5 mLs by mouth 3 (three) times daily as needed for cough.  [name] ipratropium (ATROVENT) 0.06 % nasal spray Place 2 sprays into both nostrils 4 (four) times daily.  [name] PB-Hyoscy-Atropine-Scopol ER (DONNATAL EXTENTABS) 48.6 MG TBCR Take 1 tablet by mouth daily as needed. For cramping  . traMADol (ULTRAM) 50 MG tablet Take 50 mg by mouth every 6 (six) hours as needed.     Orders Placed This Encounter  Procedures  . EKG 12-Lead    No Follow-up on file.

## 2013-06-02 NOTE — Assessment & Plan Note (Signed)
Patient  is considered to be at low risk  For perioperative complications based on today's exam and history.  Baseline lytes,  hgb and ekg have been done and are normal and/or unchanged

## 2013-06-03 NOTE — Telephone Encounter (Signed)
Pt sent myChart request for refills on Estrace cream and Lomotil, ok? Prescribed by previous physician

## 2013-06-04 MED ORDER — DIPHENOXYLATE-ATROPINE 2.5-0.025 MG PO TABS: 1.0000 | ORAL_TABLET | ORAL | Status: DC | PRN

## 2013-06-04 MED ORDER — ESTRADIOL 0.1 MG/GM VA CREA: 2.0000 g | TOPICAL_CREAM | Freq: Every day | VAGINAL | Status: DC

## 2013-06-05 ENCOUNTER — Ambulatory Visit: Payer: Self-pay | Admitting: Specialist

## 2013-06-05 LAB — URINALYSIS, COMPLETE
Bacteria: NONE SEEN
Bilirubin,UR: NEGATIVE
Blood: NEGATIVE
Glucose,UR: NEGATIVE mg/dL
Ketone: NEGATIVE
Nitrite: NEGATIVE
Ph: 6
Protein: NEGATIVE
RBC,UR: 1 /HPF
Specific Gravity: 1.023
Squamous Epithelial: 30
WBC UR: 2 /HPF

## 2013-06-05 LAB — APTT: Activated PTT: 23.7 secs (ref 23.6–35.9)

## 2013-06-05 LAB — MRSA PCR SCREENING

## 2013-06-05 LAB — PROTIME-INR
INR: 1
Prothrombin Time: 12.6 s

## 2013-06-12 ENCOUNTER — Telehealth: Payer: Self-pay | Admitting: Internal Medicine

## 2013-06-12 NOTE — Telephone Encounter (Signed)
Signed and faxed for the second time.

## 2013-06-12 NOTE — Telephone Encounter (Signed)
States they have faxed over paperwork 2/3 to be completed by Dr. Derrel Nip for surgical clearance.  States pt is to have surgery Friday morning and they have not received the paperwork.  They are concerned with the weather that they will not be in the office tomorrow and need to be sure the OR has everything they need to proceed with the surgery.  Lawerance Bach will fax the paperwork again, attention to Butch Penny.  Please complete and fax back today if at all possible.  [facility] direct Fax:  [phone].

## 2013-06-12 NOTE — Telephone Encounter (Signed)
Paperwork received via fax.  Placed in Dr. Lupita Dawn box.

## 2013-06-14 ENCOUNTER — Inpatient Hospital Stay: Payer: Self-pay | Admitting: Specialist

## 2013-06-14 LAB — URINALYSIS, COMPLETE
Bacteria: NONE SEEN
Bilirubin,UR: NEGATIVE
Glucose,UR: NEGATIVE mg/dL (ref 0–75)
Hyaline Cast: 4
Ketone: NEGATIVE
Nitrite: NEGATIVE
Ph: 5 (ref 4.5–8.0)
Protein: 30
RBC,UR: 66 /HPF (ref 0–5)
Specific Gravity: 1.031 (ref 1.003–1.030)
Squamous Epithelial: NONE SEEN
WBC UR: 25 /HPF (ref 0–5)

## 2013-06-14 LAB — CREATININE, SERUM
Creatinine: 0.79 mg/dL (ref 0.60–1.30)
EGFR (African American): >60
EGFR (Non-African Amer.): >60

## 2013-06-15 LAB — HEMOGLOBIN: HGB: 11.6 g/dL — ABNORMAL LOW (ref 12.0–16.0)

## 2013-06-16 LAB — HEMOGLOBIN: HGB: 10.6 g/dL — ABNORMAL LOW (ref 12.0–16.0)

## 2013-06-17 LAB — URINALYSIS, COMPLETE
Bilirubin,UR: NEGATIVE
Blood: NEGATIVE
Glucose,UR: NEGATIVE mg/dL (ref 0–75)
Nitrite: NEGATIVE
Ph: 6 (ref 4.5–8.0)
Protein: NEGATIVE
RBC,UR: 2 /HPF (ref 0–5)
Specific Gravity: 1.005 (ref 1.003–1.030)
Squamous Epithelial: 10
WBC UR: 7 /HPF (ref 0–5)

## 2013-06-17 LAB — CBC WITH DIFFERENTIAL/PLATELET
Basophil #: 0 10*3/uL (ref 0.0–0.1)
Basophil #: 0 10*3/uL (ref 0.0–0.1)
Basophil %: 0.2 %
Basophil %: 0.5 %
Eosinophil #: 0 10*3/uL (ref 0.0–0.7)
Eosinophil #: 0 10*3/uL (ref 0.0–0.7)
Eosinophil %: 0.4 %
Eosinophil %: 0.4 %
HCT: 28.9 % — ABNORMAL LOW (ref 35.0–47.0)
HCT: 28.8 % — ABNORMAL LOW (ref 35.0–47.0)
HGB: 9.8 g/dL — ABNORMAL LOW (ref 12.0–16.0)
HGB: 9.5 g/dL — ABNORMAL LOW (ref 12.0–16.0)
Lymphocyte #: 1.4 10*3/uL (ref 1.0–3.6)
Lymphocyte #: 0.8 10*3/uL — ABNORMAL LOW (ref 1.0–3.6)
Lymphocyte %: 17.3 %
Lymphocyte %: 11 %
MCH: 28.5 pg (ref 26.0–34.0)
MCH: 27.7 pg (ref 26.0–34.0)
MCHC: 34.1 g/dL (ref 32.0–36.0)
MCHC: 33.1 g/dL (ref 32.0–36.0)
MCV: 84 fL (ref 80–100)
MCV: 84 fL (ref 80–100)
Monocyte #: 0.6 x10 3/mm (ref 0.2–0.9)
Monocyte #: 0.7 x10 3/mm (ref 0.2–0.9)
Monocyte %: 7.9 %
Monocyte %: 9.9 %
Neutrophil #: 6 10*3/uL (ref 1.4–6.5)
Neutrophil #: 5.8 10*3/uL (ref 1.4–6.5)
Neutrophil %: 74.2 %
Neutrophil %: 78.2 %
Platelet: 130 10*3/uL — ABNORMAL LOW (ref 150–440)
Platelet: 135 10*3/uL — ABNORMAL LOW (ref 150–440)
RBC: 3.46 10*6/uL — ABNORMAL LOW (ref 3.80–5.20)
RBC: 3.44 10*6/uL — ABNORMAL LOW (ref 3.80–5.20)
RDW: 14.2 % (ref 11.5–14.5)
RDW: 14 % (ref 11.5–14.5)
WBC: 8.1 10*3/uL (ref 3.6–11.0)
WBC: 7.4 10*3/uL (ref 3.6–11.0)

## 2013-06-17 LAB — BASIC METABOLIC PANEL
Anion Gap: 4 — ABNORMAL LOW (ref 7–16)
BUN: 9 mg/dL (ref 7–18)
Calcium, Total: 8.3 mg/dL — ABNORMAL LOW (ref 8.5–10.1)
Chloride: 102 mmol/L (ref 98–107)
Co2: 31 mmol/L (ref 21–32)
Creatinine: 0.75 mg/dL (ref 0.60–1.30)
EGFR (African American): >60
EGFR (Non-African Amer.): >60
Glucose: 117 mg/dL — ABNORMAL HIGH (ref 65–99)
Osmolality: 274 (ref 275–301)
Potassium: 3.8 mmol/L (ref 3.5–5.1)
Sodium: 137 mmol/L (ref 136–145)

## 2013-06-17 LAB — APTT: Activated PTT: 38.3 secs — ABNORMAL HIGH (ref 23.6–35.9)

## 2013-06-17 LAB — MAGNESIUM: Magnesium: 1.7 mg/dL — ABNORMAL LOW

## 2013-06-17 LAB — TSH: Thyroid Stimulating Horm: 3.51 u[IU]/mL

## 2013-06-18 LAB — CBC WITH DIFFERENTIAL/PLATELET
Basophil #: 0 10*3/uL (ref 0.0–0.1)
Basophil %: 0.6 %
Eosinophil #: 0 10*3/uL (ref 0.0–0.7)
Eosinophil %: 0.6 %
HCT: 28.4 % — ABNORMAL LOW (ref 35.0–47.0)
HGB: 10 g/dL — ABNORMAL LOW (ref 12.0–16.0)
Lymphocyte #: 0.8 10*3/uL — ABNORMAL LOW (ref 1.0–3.6)
Lymphocyte %: 12.6 %
MCH: 29.1 pg (ref 26.0–34.0)
MCHC: 35.1 g/dL (ref 32.0–36.0)
MCV: 83 fL (ref 80–100)
Monocyte #: 0.5 x10 3/mm (ref 0.2–0.9)
Monocyte %: 8.3 %
Neutrophil #: 5 10*3/uL (ref 1.4–6.5)
Neutrophil %: 77.9 %
Platelet: 145 10*3/uL — ABNORMAL LOW (ref 150–440)
RBC: 3.42 10*6/uL — ABNORMAL LOW (ref 3.80–5.20)
RDW: 14.1 % (ref 11.5–14.5)
WBC: 6.4 10*3/uL (ref 3.6–11.0)

## 2013-06-18 LAB — BASIC METABOLIC PANEL
Anion Gap: 6 — ABNORMAL LOW (ref 7–16)
BUN: 6 mg/dL — ABNORMAL LOW (ref 7–18)
Calcium, Total: 8.3 mg/dL — ABNORMAL LOW (ref 8.5–10.1)
Chloride: 104 mmol/L (ref 98–107)
Co2: 29 mmol/L (ref 21–32)
Glucose: 124 mg/dL — ABNORMAL HIGH (ref 65–99)
Osmolality: 277 (ref 275–301)
Potassium: 3.5 mmol/L (ref 3.5–5.1)
Sodium: 139 mmol/L (ref 136–145)

## 2013-06-18 LAB — CREATININE, SERUM
Creatinine: 0.65 mg/dL (ref 0.60–1.30)
EGFR (African American): >60
EGFR (Non-African Amer.): >60

## 2013-06-26 ENCOUNTER — Other Ambulatory Visit: Payer: Self-pay | Admitting: *Deleted

## 2013-06-26 MED ORDER — NYSTATIN-TRIAMCINOLONE 100000-0.1 UNIT/GM-% EX CREA: 1.0000 "application " | TOPICAL_CREAM | Freq: Two times a day (BID) | CUTANEOUS | Status: DC

## 2013-07-03 ENCOUNTER — Encounter: Payer: Self-pay | Admitting: Specialist

## 2013-07-17 ENCOUNTER — Encounter: Payer: Self-pay | Admitting: Specialist

## 2013-07-22 ENCOUNTER — Other Ambulatory Visit: Payer: Self-pay | Admitting: Internal Medicine

## 2013-07-30 ENCOUNTER — Other Ambulatory Visit: Payer: Self-pay | Admitting: Unknown Physician Specialty

## 2013-07-30 LAB — CBC WITH DIFFERENTIAL/PLATELET
Basophil #: 0 10*3/uL (ref 0.0–0.1)
Basophil %: 0.8 %
Eosinophil #: 0.1 10*3/uL (ref 0.0–0.7)
Eosinophil %: 1.2 %
HCT: 38.6 % (ref 35.0–47.0)
HGB: 12.6 g/dL (ref 12.0–16.0)
Lymphocyte #: 1.6 10*3/uL (ref 1.0–3.6)
Lymphocyte %: 29.5 %
MCH: 26.5 pg (ref 26.0–34.0)
MCHC: 32.8 g/dL (ref 32.0–36.0)
MCV: 81 fL (ref 80–100)
Monocyte #: 0.5 x10 3/mm (ref 0.2–0.9)
Monocyte %: 9.4 %
Neutrophil #: 3.2 10*3/uL (ref 1.4–6.5)
Neutrophil %: 59.1 %
Platelet: 219 10*3/uL (ref 150–440)
RBC: 4.77 10*6/uL (ref 3.80–5.20)
RDW: 14.4 % (ref 11.5–14.5)
WBC: 5.4 10*3/uL (ref 3.6–11.0)

## 2013-07-30 LAB — BASIC METABOLIC PANEL
Anion Gap: 7 (ref 7–16)
BUN: 10 mg/dL (ref 7–18)
Calcium, Total: 8.7 mg/dL (ref 8.5–10.1)
Chloride: 109 mmol/L — ABNORMAL HIGH (ref 98–107)
Co2: 25 mmol/L (ref 21–32)
Creatinine: 0.75 mg/dL (ref 0.60–1.30)
EGFR (African American): >60
EGFR (Non-African Amer.): >60
Glucose: 112 mg/dL — ABNORMAL HIGH (ref 65–99)
Osmolality: 281 (ref 275–301)
Potassium: 3.7 mmol/L (ref 3.5–5.1)
Sodium: 141 mmol/L (ref 136–145)

## 2013-07-30 LAB — SEDIMENTATION RATE: Erythrocyte Sed Rate: 8 mm/hr (ref 0–30)

## 2013-08-03 ENCOUNTER — Other Ambulatory Visit: Payer: Self-pay | Admitting: Unknown Physician Specialty

## 2013-08-03 LAB — CLOSTRIDIUM DIFFICILE(ARMC)

## 2013-08-05 ENCOUNTER — Encounter: Payer: Self-pay | Admitting: Internal Medicine

## 2013-08-05 DIAGNOSIS — I2699 Other pulmonary embolism without acute cor pulmonale: Secondary | ICD-10-CM

## 2013-08-06 ENCOUNTER — Other Ambulatory Visit: Payer: Self-pay | Admitting: Internal Medicine

## 2013-08-06 DIAGNOSIS — I2699 Other pulmonary embolism without acute cor pulmonale: Secondary | ICD-10-CM

## 2013-08-06 HISTORY — DX: Other pulmonary embolism without acute cor pulmonale: I26.99

## 2013-08-06 MED ORDER — RIVAROXABAN 20 MG PO TABS: 20.0000 mg | ORAL_TABLET | Freq: Every day | ORAL | Status: DC

## 2013-08-06 NOTE — Telephone Encounter (Signed)
Ok refill? 

## 2013-08-07 LAB — STOOL CULTURE

## 2013-08-16 ENCOUNTER — Encounter: Payer: Self-pay | Admitting: Specialist

## 2013-08-23 ENCOUNTER — Encounter: Payer: Self-pay | Admitting: Internal Medicine

## 2013-08-23 ENCOUNTER — Ambulatory Visit (INDEPENDENT_AMBULATORY_CARE_PROVIDER_SITE_OTHER): Payer: 59 | Admitting: Internal Medicine

## 2013-08-23 VITALS — BP 128/78 | HR 95 | Temp 98.5°F | Resp 16 | Wt 272.0 lb

## 2013-08-23 DIAGNOSIS — I471 Supraventricular tachycardia: Secondary | ICD-10-CM

## 2013-08-23 DIAGNOSIS — Z96659 Presence of unspecified artificial knee joint: Secondary | ICD-10-CM

## 2013-08-23 DIAGNOSIS — H6690 Otitis media, unspecified, unspecified ear: Secondary | ICD-10-CM

## 2013-08-23 DIAGNOSIS — I498 Other specified cardiac arrhythmias: Secondary | ICD-10-CM

## 2013-08-23 DIAGNOSIS — E669 Obesity, unspecified: Secondary | ICD-10-CM

## 2013-08-23 MED ORDER — HYDROCOD POLST-CHLORPHEN POLST 10-8 MG/5ML PO LQCR: 5.0000 mL | Freq: Two times a day (BID) | ORAL | Status: DC | PRN

## 2013-08-23 MED ORDER — AMOXICILLIN-POT CLAVULANATE 875-125 MG PO TABS: 1.0000 | ORAL_TABLET | Freq: Two times a day (BID) | ORAL | Status: DC

## 2013-08-23 NOTE — Patient Instructions (Signed)
I am treating you for sinusitis /otitis with augmentin  Benadryl 25 mg at bedtime to help the post nasal drip  tussionex at night for cough

## 2013-08-23 NOTE — Progress Notes (Signed)
Pre-visit discussion using our clinic review tool. No additional management support is needed unless otherwise documented below in the visit note.  

## 2013-08-23 NOTE — Progress Notes (Signed)
Patient ID: Tiffany Wells, female   DOB: 1955/06/29, 58 y.o.   MRN: [id number]  Patient Active Problem List   Diagnosis Date Noted  . Otitis media 08/25/2013  . S/P total knee replacement 08/25/2013  . Acute pulmonary embolism 08/06/2013  . Encounter for preventive health examination 05/05/2013  . SVT (supraventricular tachycardia) 12/30/2012  . DJD (degenerative joint disease) of knee 07/15/2012  . Obesity, unspecified 03/20/2012  . Flushing reaction 03/20/2012  . Internal hemorrhoids   . History of Salmonella gastroenteritis   . Hiatal hernia 03/16/2011  . ESSENTIAL HYPERTENSION 02/04/2010    Subjective:  CC:   Chief Complaint  Patient presents with  . Acute Visit    GI Isues saw Dr.Elliot , hear today for posible ear infection.    HPI:   Tiffany Wells is a 58 y.o. female who presents for Right ear pain.  She had contact with a sick grandson two weeks ago.  Since since April 27th,  Has been having a persistent cough,  Right ear has now been hurting since Tuesday .  Pulmonary embolism;  Found postoperatively during TKR in early April. Managed with Xarelto .  Total knee replacement.  finishing PT independently  Due to anticipated noncoverage of future sessions needed for her other knee replacement.    Past Medical History  Diagnosis Date  . Back pain   . Hypertension   . Normal cardiac stress test     2006, nromal stress cardiolite, EF 65%  . Internal hemorrhoids   . History of Salmonella gastroenteritis 2012    Past Surgical History  Procedure Laterality Date  . Rotator cuff repair    . Tubal ligation    . Colonoscopy  03/07/2011    Int Hemms  . Hysteroscopy    . Upper gastrointestinal endoscopy  03/07/2011    HH Multip Gastric Polyps/ Dr Vira Agar       The following portions of the patient's history were reviewed and updated as appropriate: Allergies, current medications, and problem list.    Review of Systems:   Patient denies headache,  fevers, malaise, unintentional weight loss, skin rash, eye pain, sinus congestion and sinus pain, sore throat, dysphagia,  hemoptysis , cough, dyspnea, wheezing, chest pain, palpitations, orthopnea, edema, abdominal pain, nausea, melena, diarrhea, constipation, flank pain, dysuria, hematuria, urinary  Frequency, nocturia, numbness, tingling, seizures,  Focal weakness, Loss of consciousness,  Tremor, insomnia, depression, anxiety, and suicidal ideation.     History   Social History  . Marital Status: Married    Spouse Name: N/A    Number of Children: 2  . Years of Education: N/A   Occupational History  . 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 Smokeless tobacco: Never Used  . Alcohol Use: 1.2 oz/week    2 Glasses of wine per week     Comment: very rare  . Drug Use: No  . Sexual Activity: Not on file   Other Topics Concern  . Not on file   Social History Narrative   Lives with husband.    Objective:  Filed Vitals:   08/23/13 0903  BP: 128/78  Pulse: 95  Temp: 98.5 F (36.9 C)  Resp: 16     General appearance: alert, cooperative and appears stated age Ears: normal TM's and external ear canals both ears Throat: lips, mucosa, and tongue normal; teeth and gums normal Neck: no adenopathy, no carotid bruit, supple, symmetrical, trachea  midline and thyroid not enlarged, symmetric, no tenderness/mass/nodules Back: symmetric, no curvature. ROM normal. No CVA tenderness. Lungs: clear to auscultation bilaterally Heart: regular rate and rhythm, S1, S2 normal, no murmur, click, rub or gallop Abdomen: soft, non-tender; bowel sounds normal; no masses,  no organomegaly Pulses: 2+ and symmetric Skin: Skin color, texture, turgor normal. No rashes or lesions Lymph nodes: Cervical, supraclavicular, and axillary nodes normal.  Assessment and Plan:  Otitis media Mild, but given her history and symptoms,  Will treat with  augmentin , probiotics  SVT (supraventricular tachycardia)  considered by hospitalist to be secondary to PE.  continue toprol  Obesity, unspecified She has lost 12 lbs since surgery    Updated Medication List Outpatient Encounter Prescriptions as of 08/23/2013  Medication Sig  . Biotin 300 MCG TABS Take 1 tablet by mouth daily.    . chlorpheniramine-HYDROcodone (TUSSIONEX) 10-8 MG/5ML LQCR Take 5 mLs by mouth every 12 (twelve) hours as needed for cough.  Drusilla Kanner EXTRACT PO 1200 mg a day   . cyclobenzaprine (FLEXERIL) 10 MG tablet TAKE 1/2 TO 1 TABLET BY MOUTH 3 TIMES A DAY AS NEEDED FOR BACK PAIN  . diltiazem (CARDIZEM CD) 180 MG 24 hr capsule Take 180 mg by mouth daily.  . diphenhydrAMINE (BENADRYL) 25 MG tablet As needed for allergies.   . diphenoxylate-atropine (LOMOTIL) 2.5-0.025 MG per tablet Take 1 tablet by mouth as needed.  [name] estradiol (ESTRACE) 0.1 MG/GM vaginal cream Place 6.76 Applicatorfuls vaginally daily.  . metoprolol succinate (TOPROL-XL) 25 MG 24 hr tablet TAKE 1 TABLET BY MOUTH TWICE DAILY  . omeprazole (PRILOSEC OTC) 20 MG tablet Take 20 mg by mouth daily.    . rivaroxaban (XARELTO) 20 MG TABS tablet Take 1 tablet (20 mg total) by mouth daily with supper.  . [DISCONTINUED] chlorpheniramine-HYDROcodone (TUSSIONEX) 10-8 MG/5ML LQCR Take 5 mLs by mouth every 12 (twelve) hours as needed for cough.  [name] amoxicillin-clavulanate (AUGMENTIN) 875-125 MG per tablet Take 1 tablet by mouth 2 (two) times daily.  . benzonatate (TESSALON PERLES) 100 MG capsule Take 2 capsules (200 mg total) by mouth 3 (three) times daily as needed for cough.  [name] guaiFENesin-codeine (ROBITUSSIN AC) 100-10 MG/5ML syrup Take 5 mLs by mouth 3 (three) times daily as needed for cough.  . Multiple Vitamin (MULTIVITAMIN) tablet Take 1 tablet by mouth daily.    . [DISCONTINUED] amoxicillin-clavulanate (AUGMENTIN) 875-125 MG per tablet Take 1 tablet by mouth 2 (two) times daily.  . [DISCONTINUED]  HYDROcodone-acetaminophen (VICODIN) 5-500 MG per tablet Take 1 tablet by mouth two times a day as needed for back pain.   . [DISCONTINUED] ipratropium (ATROVENT) 0.06 % nasal spray Place 2 sprays into both nostrils 4 (four) times daily.  . [DISCONTINUED] meclizine (ANTIVERT) 25 MG tablet Take 25 mg by mouth every 6 (six) hours as needed.    . [DISCONTINUED] nystatin-triamcinolone (MYCOLOG II) cream Apply 1 application topically 2 (two) times daily.  . [DISCONTINUED] PB-Hyoscy-Atropine-Scopol ER (DONNATAL EXTENTABS) 48.6 MG TBCR Take 1 tablet by mouth daily as needed. For cramping  . [DISCONTINUED] potassium chloride SA (K-DUR,KLOR-CON) 20 MEQ tablet Take 20 mEq by mouth as needed. For leg cramps  . [DISCONTINUED] traMADol (ULTRAM) 50 MG tablet Take 50 mg by mouth every 6 (six) hours as needed.     No orders of the defined types were placed in this encounter.    No Follow-up on file.

## 2013-08-25 ENCOUNTER — Encounter: Payer: Self-pay | Admitting: Internal Medicine

## 2013-08-25 ENCOUNTER — Other Ambulatory Visit: Payer: Self-pay | Admitting: Internal Medicine

## 2013-08-25 DIAGNOSIS — H6690 Otitis media, unspecified, unspecified ear: Secondary | ICD-10-CM | POA: Insufficient documentation

## 2013-08-25 DIAGNOSIS — Z96659 Presence of unspecified artificial knee joint: Secondary | ICD-10-CM | POA: Insufficient documentation

## 2013-08-25 NOTE — Assessment & Plan Note (Signed)
She has lost 12 lbs since surgery

## 2013-08-25 NOTE — Assessment & Plan Note (Signed)
considered by hospitalist to be secondary to PE.  continue toprol

## 2013-08-25 NOTE — Assessment & Plan Note (Signed)
Mild, but given her history and symptoms,  Will treat with augmentin , probiotics

## 2013-08-26 ENCOUNTER — Other Ambulatory Visit: Payer: Self-pay | Admitting: Internal Medicine

## 2013-08-26 MED ORDER — METOPROLOL SUCCINATE ER 25 MG PO TB24: ORAL_TABLET | ORAL | Status: DC

## 2013-09-10 ENCOUNTER — Other Ambulatory Visit: Payer: Self-pay | Admitting: General Surgery

## 2013-09-10 DIAGNOSIS — N390 Urinary tract infection, site not specified: Secondary | ICD-10-CM

## 2013-09-10 LAB — POCT URINALYSIS DIPSTICK
Blood, UA: POSITIVE — AB
Glucose, UA: NEGATIVE
Ketones, UA: POSITIVE
Spec Grav, UA: 1.025
Urobilinogen, UA: NORMAL
pH, UA: 7.5 (ref 4.5–8.0)

## 2013-09-22 ENCOUNTER — Other Ambulatory Visit: Payer: Self-pay | Admitting: Internal Medicine

## 2013-09-22 DIAGNOSIS — B3749 Other urogenital candidiasis: Secondary | ICD-10-CM | POA: Insufficient documentation

## 2013-09-22 MED ORDER — FLUCONAZOLE 150 MG PO TABS: 150.0000 mg | ORAL_TABLET | Freq: Every day | ORAL | Status: DC

## 2013-09-22 NOTE — Assessment & Plan Note (Signed)
Secondary to abx for recurrent uti.

## 2013-09-23 ENCOUNTER — Encounter: Payer: Self-pay | Admitting: Internal Medicine

## 2013-09-23 DIAGNOSIS — B3749 Other urogenital candidiasis: Secondary | ICD-10-CM

## 2013-09-24 MED ORDER — FLUCONAZOLE 150 MG PO TABS: 150.0000 mg | ORAL_TABLET | Freq: Every day | ORAL | Status: DC

## 2013-09-26 LAB — CULTURE, URINE COMPREHENSIVE

## 2013-10-15 ENCOUNTER — Encounter: Payer: Self-pay | Admitting: Internal Medicine

## 2013-10-22 ENCOUNTER — Ambulatory Visit (INDEPENDENT_AMBULATORY_CARE_PROVIDER_SITE_OTHER): Payer: 59 | Admitting: *Deleted

## 2013-10-22 DIAGNOSIS — Z23 Encounter for immunization: Secondary | ICD-10-CM

## 2013-10-28 DIAGNOSIS — I2699 Other pulmonary embolism without acute cor pulmonale: Secondary | ICD-10-CM | POA: Insufficient documentation

## 2013-10-28 DIAGNOSIS — I82409 Acute embolism and thrombosis of unspecified deep veins of unspecified lower extremity: Secondary | ICD-10-CM | POA: Insufficient documentation

## 2013-10-28 DIAGNOSIS — I471 Supraventricular tachycardia, unspecified: Secondary | ICD-10-CM | POA: Insufficient documentation

## 2013-10-28 DIAGNOSIS — I10 Essential (primary) hypertension: Secondary | ICD-10-CM | POA: Insufficient documentation

## 2013-11-14 ENCOUNTER — Ambulatory Visit: Payer: Self-pay | Admitting: Specialist

## 2013-11-14 LAB — URINALYSIS, COMPLETE
Bilirubin,UR: NEGATIVE
Blood: NEGATIVE
Glucose,UR: NEGATIVE mg/dL (ref 0–75)
Ketone: NEGATIVE
Nitrite: NEGATIVE
Ph: 6 (ref 4.5–8.0)
Protein: NEGATIVE
RBC,UR: 3 /HPF (ref 0–5)
Specific Gravity: 1.013 (ref 1.003–1.030)
Squamous Epithelial: 30
Transitional Epi: 1
WBC UR: 23 /HPF (ref 0–5)

## 2013-11-14 LAB — PROTIME-INR
INR: 1.1
Prothrombin Time: 13.6 secs (ref 11.5–14.7)

## 2013-11-14 LAB — HEMOGLOBIN: HGB: 11.9 g/dL — ABNORMAL LOW (ref 12.0–16.0)

## 2013-11-14 LAB — MRSA PCR SCREENING

## 2013-11-15 ENCOUNTER — Ambulatory Visit: Payer: Self-pay | Admitting: Vascular Surgery

## 2013-11-19 ENCOUNTER — Inpatient Hospital Stay: Payer: Self-pay | Admitting: Specialist

## 2013-11-19 LAB — CREATININE, SERUM
Creatinine: 0.86 mg/dL (ref 0.60–1.30)
EGFR (African American): >60
EGFR (Non-African Amer.): >60

## 2013-11-20 LAB — HEMOGLOBIN: HGB: 10.6 g/dL — ABNORMAL LOW (ref 12.0–16.0)

## 2013-11-21 LAB — HEMOGLOBIN: HGB: 7.9 g/dL — ABNORMAL LOW (ref 12.0–16.0)

## 2013-11-22 LAB — HEMOGLOBIN: HGB: 7.2 g/dL — ABNORMAL LOW (ref 12.0–16.0)

## 2013-11-23 LAB — CREATININE, SERUM
Creatinine: 0.89 mg/dL (ref 0.60–1.30)
EGFR (African American): >60
EGFR (Non-African Amer.): >60

## 2013-11-23 LAB — HEMOGLOBIN: HGB: 8.3 g/dL — ABNORMAL LOW (ref 12.0–16.0)

## 2013-11-24 LAB — HEMOGLOBIN: HGB: 8.1 g/dL — ABNORMAL LOW (ref 12.0–16.0)

## 2013-11-30 ENCOUNTER — Inpatient Hospital Stay: Payer: Self-pay | Admitting: Cardiology

## 2013-11-30 LAB — COMPREHENSIVE METABOLIC PANEL
Albumin: 2.5 g/dL — ABNORMAL LOW (ref 3.4–5.0)
Alkaline Phosphatase: 112 U/L
Anion Gap: 12 (ref 7–16)
BUN: 10 mg/dL (ref 7–18)
Bilirubin,Total: 0.6 mg/dL (ref 0.2–1.0)
Calcium, Total: 8 mg/dL — ABNORMAL LOW (ref 8.5–10.1)
Chloride: 107 mmol/L (ref 98–107)
Co2: 24 mmol/L (ref 21–32)
Creatinine: 0.89 mg/dL (ref 0.60–1.30)
EGFR (African American): >60
EGFR (Non-African Amer.): >60
Glucose: 141 mg/dL — ABNORMAL HIGH (ref 65–99)
Osmolality: 286 (ref 275–301)
Potassium: 3.7 mmol/L (ref 3.5–5.1)
SGOT(AST): 26 U/L (ref 15–37)
SGPT (ALT): 22 U/L
Sodium: 143 mmol/L (ref 136–145)
Total Protein: 6.5 g/dL (ref 6.4–8.2)

## 2013-11-30 LAB — CBC
HCT: 29.7 % — ABNORMAL LOW (ref 35.0–47.0)
HGB: 9.5 g/dL — ABNORMAL LOW (ref 12.0–16.0)
MCH: 27.1 pg (ref 26.0–34.0)
MCHC: 32 g/dL (ref 32.0–36.0)
MCV: 85 fL (ref 80–100)
Platelet: 421 10*3/uL (ref 150–440)
RBC: 3.5 10*6/uL — ABNORMAL LOW (ref 3.80–5.20)
RDW: 15 % — ABNORMAL HIGH (ref 11.5–14.5)
WBC: 11.6 10*3/uL — ABNORMAL HIGH (ref 3.6–11.0)

## 2013-11-30 LAB — TROPONIN I: Troponin-I: 0.15 ng/mL — ABNORMAL HIGH

## 2013-11-30 LAB — CK TOTAL AND CKMB (NOT AT ARMC)
CK, Total: 29 U/L
CK-MB: <0.5 ng/mL — ABNORMAL LOW (ref 0.5–3.6)

## 2013-11-30 LAB — PROTIME-INR
INR: 1.3
Prothrombin Time: 16.1 s — ABNORMAL HIGH

## 2013-11-30 LAB — PRO B NATRIURETIC PEPTIDE: B-Type Natriuretic Peptide: 249 pg/mL — ABNORMAL HIGH

## 2013-11-30 LAB — APTT: Activated PTT: 43.4 s — ABNORMAL HIGH

## 2013-12-01 LAB — CBC WITH DIFFERENTIAL/PLATELET
Basophil #: 0.1 10*3/uL (ref 0.0–0.1)
Basophil %: 0.6 %
Eosinophil #: 0.1 10*3/uL (ref 0.0–0.7)
Eosinophil %: 0.9 %
HCT: 28.7 % — ABNORMAL LOW (ref 35.0–47.0)
HGB: 9.4 g/dL — ABNORMAL LOW (ref 12.0–16.0)
Lymphocyte #: 2.6 10*3/uL (ref 1.0–3.6)
Lymphocyte %: 21.2 %
MCH: 27.5 pg (ref 26.0–34.0)
MCHC: 33 g/dL (ref 32.0–36.0)
MCV: 84 fL (ref 80–100)
Monocyte #: 0.9 x10 3/mm (ref 0.2–0.9)
Monocyte %: 7 %
Neutrophil #: 8.5 10*3/uL — ABNORMAL HIGH (ref 1.4–6.5)
Neutrophil %: 70.3 %
Platelet: 405 10*3/uL (ref 150–440)
RBC: 3.43 10*6/uL — ABNORMAL LOW (ref 3.80–5.20)
RDW: 15.5 % — ABNORMAL HIGH (ref 11.5–14.5)
WBC: 12.2 10*3/uL — ABNORMAL HIGH (ref 3.6–11.0)

## 2013-12-01 LAB — BASIC METABOLIC PANEL
Anion Gap: 9 (ref 7–16)
BUN: 10 mg/dL (ref 7–18)
Calcium, Total: 8.5 mg/dL (ref 8.5–10.1)
Chloride: 105 mmol/L (ref 98–107)
Co2: 27 mmol/L (ref 21–32)
Creatinine: 0.85 mg/dL (ref 0.60–1.30)
EGFR (African American): >60
EGFR (Non-African Amer.): >60
Glucose: 114 mg/dL — ABNORMAL HIGH (ref 65–99)
Osmolality: 281 (ref 275–301)
Potassium: 4.1 mmol/L (ref 3.5–5.1)
Sodium: 141 mmol/L (ref 136–145)

## 2013-12-02 ENCOUNTER — Telehealth: Payer: Self-pay | Admitting: Internal Medicine

## 2013-12-02 LAB — CBC WITH DIFFERENTIAL/PLATELET
Basophil #: 0.1 10*3/uL (ref 0.0–0.1)
Basophil %: 0.7 %
Eosinophil #: 0.2 10*3/uL (ref 0.0–0.7)
Eosinophil %: 1.9 %
HCT: 26.3 % — ABNORMAL LOW (ref 35.0–47.0)
HGB: 8.6 g/dL — ABNORMAL LOW (ref 12.0–16.0)
Lymphocyte #: 2.1 10*3/uL (ref 1.0–3.6)
Lymphocyte %: 26.6 %
MCH: 27.4 pg (ref 26.0–34.0)
MCHC: 32.7 g/dL (ref 32.0–36.0)
MCV: 84 fL (ref 80–100)
Monocyte #: 0.5 x10 3/mm (ref 0.2–0.9)
Monocyte %: 6.8 %
Neutrophil #: 5.1 10*3/uL (ref 1.4–6.5)
Neutrophil %: 64 %
Platelet: 354 10*3/uL (ref 150–440)
RBC: 3.13 10*6/uL — ABNORMAL LOW (ref 3.80–5.20)
RDW: 15.5 % — ABNORMAL HIGH (ref 11.5–14.5)
WBC: 8 10*3/uL (ref 3.6–11.0)

## 2013-12-02 LAB — BASIC METABOLIC PANEL
Anion Gap: 7 (ref 7–16)
BUN: 9 mg/dL (ref 7–18)
Calcium, Total: 8.2 mg/dL — ABNORMAL LOW (ref 8.5–10.1)
Chloride: 107 mmol/L (ref 98–107)
Co2: 29 mmol/L (ref 21–32)
Creatinine: 0.77 mg/dL (ref 0.60–1.30)
EGFR (African American): >60
EGFR (Non-African Amer.): >60
Glucose: 99 mg/dL (ref 65–99)
Osmolality: 284 (ref 275–301)
Potassium: 4.2 mmol/L (ref 3.5–5.1)
Sodium: 143 mmol/L (ref 136–145)

## 2013-12-02 NOTE — Telephone Encounter (Signed)
Yes, but if she needs to be seen [facility] i will stay late or work her in at lunch

## 2013-12-02 NOTE — Telephone Encounter (Signed)
Can we use 5.30 Monday?

## 2013-12-02 NOTE — Telephone Encounter (Signed)
Pt needs HFU for a.fib. Please advise where to add be to the schedule.

## 2013-12-03 NOTE — Telephone Encounter (Signed)
We had a cancellation on Thursday at 10.00 Patient is scheduled in that spot. For hospital follow up, sent for discharge summary for patient.

## 2013-12-05 ENCOUNTER — Ambulatory Visit (INDEPENDENT_AMBULATORY_CARE_PROVIDER_SITE_OTHER): Payer: 59 | Admitting: Internal Medicine

## 2013-12-05 ENCOUNTER — Encounter: Payer: Self-pay | Admitting: Internal Medicine

## 2013-12-05 VITALS — BP 126/78 | HR 120 | Temp 98.4°F | Resp 14 | Ht 68.0 in | Wt 265.5 lb

## 2013-12-05 DIAGNOSIS — Z96651 Presence of right artificial knee joint: Secondary | ICD-10-CM

## 2013-12-05 DIAGNOSIS — Z96659 Presence of unspecified artificial knee joint: Secondary | ICD-10-CM

## 2013-12-05 DIAGNOSIS — D62 Acute posthemorrhagic anemia: Secondary | ICD-10-CM

## 2013-12-05 LAB — CBC WITH DIFFERENTIAL/PLATELET
Basophils Absolute: 0.1 10*3/uL (ref 0.0–0.1)
Basophils Relative: 0.7 % (ref 0.0–3.0)
Eosinophils Absolute: 0.1 10*3/uL (ref 0.0–0.7)
Eosinophils Relative: 1.2 % (ref 0.0–5.0)
HCT: 33.3 % — ABNORMAL LOW (ref 36.0–46.0)
Hemoglobin: 10.9 g/dL — ABNORMAL LOW (ref 12.0–15.0)
Lymphocytes Relative: 21.8 % (ref 12.0–46.0)
Lymphs Abs: 1.8 10*3/uL (ref 0.7–4.0)
MCHC: 32.6 g/dL (ref 30.0–36.0)
MCV: 83.4 fl (ref 78.0–100.0)
Monocytes Absolute: 0.6 10*3/uL (ref 0.1–1.0)
Monocytes Relative: 7.4 % (ref 3.0–12.0)
Neutro Abs: 5.7 10*3/uL (ref 1.4–7.7)
Neutrophils Relative %: 68.9 % (ref 43.0–77.0)
Platelets: 471 10*3/uL — ABNORMAL HIGH (ref 150.0–400.0)
RBC: 4 Mil/uL (ref 3.87–5.11)
RDW: 16.1 % — ABNORMAL HIGH (ref 11.5–15.5)
WBC: 8.3 10*3/uL (ref 4.0–10.5)

## 2013-12-05 LAB — IBC PANEL
Iron: 58 ug/dL (ref 42–145)
Saturation Ratios: 17.8 % — ABNORMAL LOW (ref 20.0–50.0)
Transferrin: 232.8 mg/dL (ref 212.0–360.0)

## 2013-12-05 NOTE — Progress Notes (Signed)
Patient ID: Tiffany Wells, female   DOB: [birth year], 58 y.o.   MRN: [id number]  Patient Active Problem List   Diagnosis Date Noted  . Acute post-hemorrhagic anemia 12/07/2013  . Candida UTI 09/22/2013  . S/P total knee replacement 08/25/2013  . Acute pulmonary embolism 08/06/2013  . Encounter for preventive health examination 05/05/2013  . SVT (supraventricular tachycardia) 12/30/2012  . DJD (degenerative joint disease) of knee 07/15/2012  . Obesity, unspecified 03/20/2012  . Flushing reaction 03/20/2012  . Internal hemorrhoids   . History of Salmonella gastroenteritis   . Hiatal hernia 03/16/2011  . ESSENTIAL HYPERTENSION 02/04/2010    Subjective:  CC:   Chief Complaint  Patient presents with  . Hospitalization Follow-up    A-Fib    HPI:   Tiffany Wells is a 58 y.o. female who presents for hospital follow up from knee replacement complicated by acute blood loss.  Post operatively she dropped 2 units and developed massive hematomat ot left leg . Patient's Xarelto was resume the evening after surgery/  She received 2 units hgb . Her hgb was 8.9 on Sunday at discharge.  Has been takinf iron supplements twice daily for the past week  At the suggestion f her husband,  Dr Jeff Hillegass.   Getting home PT daily for another week   Atrial fib .  cardizem was  240 mg qd  And metoprol 25 mg bid       taking iron twice daily for the past week                                                                                                                Past Medical History  Diagnosis Date  . Back pain   . Hypertension   . Normal cardiac stress test     20 06, nromal stress cardiolite, EF 65%  . Internal hemorrhoids   . History of Salmonella gastroenteritis 2012    Past Surgical History  Procedure Laterality Date  . Rotator cuff repair    . Tubal ligation    . Colonoscopy  03/07/2011    Int Hemms  . Hysteroscopy    . Upper gastrointestinal endoscopy  03/07/2011    HH  Multip Gastric Polyps/ Dr Vira Agar       The following portions of the patient's history were reviewed and updated as appropriate: Allergies, current medications, and problem list.    Review of Systems:   Patient denies headache, fevers, malaise, unintentional weight loss, skin rash, eye pain, sinus congestion and sinus pain, sore throat, dysphagia,  hemoptysis , cough, dyspnea, wheezing, chest pain, palpitations, orthopnea, edema, abdominal pain, nausea, melena, diarrhea, constipation, flank pain, dysuria, hematuria, urinary  Frequency, nocturia, numbness, tingling, seizures,  Focal weakness, Loss of consciousness,  Tremor, insomnia, depression, anxiety, and suicidal ideation.     History   Social History  . Marital Status: Married    Spouse Name: N/A    Number of Children: 2  . Years of Education: N/A   Occupational History  .  Glass blower/designer, [place] History Main Topics  . Smoking status: Never Smoker   . Smokeless tobacco: Never Used  . Alcohol Use: 1.2 oz/week    2 Glasses of wine per week     Comment: very rare  . Drug Use: No  . Sexual Activity: Not on file   Other Topics Concern  . Not on file   Social History Narrative   Lives with husband.    Objective:  Filed Vitals:   12/05/13 1021  BP: 126/78  Pulse: 120  Temp: 98.4 F (36.9 C)  Resp: 14     General appearance: alert, cooperative and appears stated age Ears: normal TM's and external ear canals both ears Throat: lips, mucosa, and tongue normal; teeth and gums normal Neck: no adenopathy, no carotid bruit, supple, symmetrical, trachea midline and thyroid not enlarged, symmetric, no tenderness/mass/nodules Back: symmetric, no curvature. ROM normal. No CVA tenderness. Lungs: clear to auscultation bilaterally Heart: regular rate and rhythm, S1, S2 normal, no murmur, click, rub or gallop Abdomen: soft, non-tender; bowel sounds normal; no masses,  no organomegaly Pulses: 2+ and  symmetric Skin: Skin color, texture, turgor normal. No rashes or lesions Lymph nodes: Cervical, supraclavicular, and axillary nodes normal.  Assessment and Plan:  S/P total knee replacement She is recovering weel from R TKR done several weeks ago   Acute post-hemorrhagic anemia Secondary to Xarelto induced post operative , resulting in large right leg hematoma and need for transfusion of 2 units of PRBC. She has been taking iron twice daily for the past week.  Her hgb was improved to 10., continue once daily iron for one more month.  Lab Results  Component Value Date   HGB 10.9* 12/05/2013    Updated Medication List Outpatient Encounter Prescriptions as of 12/05/2013  Medication Sig  . diltiazem (CARDIZEM CD) 180 MG 24 hr capsule Take 180 mg by mouth daily.  . diphenhydrAMINE (BENADRYL) 25 MG tablet As needed for allergies.   [name] estradiol (ESTRACE) 0.1 MG/GM vaginal cream Place 3.54 Applicatorfuls vaginally daily.  . metoprolol succinate (TOPROL-XL) 25 MG 24 hr tablet TAKE 1 TABLET BY MOUTH TWICE DAILY  . omeprazole (PRILOSEC OTC) 20 MG tablet Take 20 mg by mouth daily.    . rivaroxaban (XARELTO) 20 MG TABS tablet Take 1 tablet (20 mg total) by mouth daily with supper.  . Biotin 300 MCG TABS Take 1 tablet by mouth daily.    [name] CRANBERRY EXTRACT PO 1200 mg a day   . cyclobenzaprine (FLEXERIL) 10 MG tablet TAKE 1/2 TO 1 TABLET BY MOUTH 3 TIMES A DAY AS NEEDED FOR BACK PAIN  . diphenoxylate-atropine (LOMOTIL) 2.5-0.025 MG per tablet Take 1 tablet by mouth as needed.  . Multiple Vitamin (MULTIVITAMIN) tablet Take 1 tablet by mouth daily.    . [DISCONTINUED] amoxicillin-clavulanate (AUGMENTIN) 875-125 MG per tablet Take 1 tablet by mouth 2 (two) times daily.  . [DISCONTINUED] benzonatate (TESSALON PERLES) 100 MG capsule Take 2 capsules (200 mg total) by mouth 3 (three) times daily as needed for cough.  . [DISCONTINUED] chlorpheniramine-HYDROcodone (TUSSIONEX) 10-8 MG/5ML LQCR Take 5 mLs by  mouth every 12 (twelve) hours as needed for cough.  . [DISCONTINUED] fluconazole (DIFLUCAN) 150 MG tablet Take 1 tablet (150 mg total) by mouth daily.  . [DISCONTINUED] guaiFENesin-codeine (ROBITUSSIN AC) 100-10 MG/5ML syrup Take 5 mLs by mouth 3 (three) times daily as needed for cough.     Orders Placed This Encounter  Procedures  . IBC  panel  . CBC with Differential    No Follow-up on file.

## 2013-12-05 NOTE — Progress Notes (Signed)
Pre visit review using our clinic review tool, if applicable. No additional management support is needed unless otherwise documented below in the visit note. 

## 2013-12-07 ENCOUNTER — Encounter: Payer: Self-pay | Admitting: Internal Medicine

## 2013-12-07 DIAGNOSIS — D62 Acute posthemorrhagic anemia: Secondary | ICD-10-CM | POA: Insufficient documentation

## 2013-12-07 NOTE — Assessment & Plan Note (Addendum)
Secondary to Xarelto induced post operative , resulting in large right leg hematoma and need for transfusion of 2 units of PRBC. She has been taking iron twice daily for the past week.  Her hgb was improved to 10., continue once daily iron for one more month.  Lab Results  Component Value Date   HGB 10.9* 12/05/2013

## 2013-12-07 NOTE — Assessment & Plan Note (Signed)
She is recovering weel from R TKR done several weeks ago

## 2013-12-10 NOTE — Telephone Encounter (Signed)
Mailed unread message to pt  

## 2013-12-16 ENCOUNTER — Encounter: Payer: Self-pay | Admitting: Specialist

## 2013-12-17 ENCOUNTER — Encounter: Payer: Self-pay | Admitting: Specialist

## 2014-01-01 ENCOUNTER — Ambulatory Visit: Payer: Self-pay | Admitting: Unknown Physician Specialty

## 2014-01-10 ENCOUNTER — Ambulatory Visit: Payer: Self-pay | Admitting: Vascular Surgery

## 2014-01-16 ENCOUNTER — Encounter: Payer: Self-pay | Admitting: Specialist

## 2014-01-20 ENCOUNTER — Other Ambulatory Visit: Payer: Self-pay | Admitting: Internal Medicine

## 2014-01-27 ENCOUNTER — Other Ambulatory Visit: Payer: Self-pay | Admitting: Internal Medicine

## 2014-01-27 DIAGNOSIS — N3090 Cystitis, unspecified without hematuria: Secondary | ICD-10-CM

## 2014-02-03 ENCOUNTER — Encounter: Payer: Self-pay | Admitting: Internal Medicine

## 2014-02-03 ENCOUNTER — Ambulatory Visit (INDEPENDENT_AMBULATORY_CARE_PROVIDER_SITE_OTHER): Payer: 59 | Admitting: Internal Medicine

## 2014-02-03 VITALS — BP 110/78 | HR 112 | Temp 99.1°F | Resp 16 | Ht 68.0 in | Wt 263.5 lb

## 2014-02-03 DIAGNOSIS — H6590 Unspecified nonsuppurative otitis media, unspecified ear: Secondary | ICD-10-CM | POA: Insufficient documentation

## 2014-02-03 DIAGNOSIS — H65191 Other acute nonsuppurative otitis media, right ear: Secondary | ICD-10-CM

## 2014-02-03 DIAGNOSIS — N3090 Cystitis, unspecified without hematuria: Secondary | ICD-10-CM

## 2014-02-03 DIAGNOSIS — N3080 Other cystitis without hematuria: Secondary | ICD-10-CM

## 2014-02-03 DIAGNOSIS — R300 Dysuria: Secondary | ICD-10-CM

## 2014-02-03 LAB — URINALYSIS, ROUTINE W REFLEX MICROSCOPIC
Bilirubin Urine: NEGATIVE
Hgb urine dipstick: NEGATIVE
Ketones, ur: NEGATIVE
Nitrite: NEGATIVE
Specific Gravity, Urine: 1.015 (ref 1.000–1.030)
Total Protein, Urine: NEGATIVE
Urine Glucose: NEGATIVE
Urobilinogen, UA: 0.2 (ref 0.0–1.0)
pH: 6 (ref 5.0–8.0)

## 2014-02-03 LAB — POCT URINALYSIS DIPSTICK
Bilirubin, UA: NEGATIVE
Glucose, UA: NEGATIVE
Ketones, UA: NEGATIVE
Nitrite, UA: NEGATIVE
Protein, UA: NEGATIVE
Spec Grav, UA: 1.015
Urobilinogen, UA: 0.2
pH, UA: 6

## 2014-02-03 MED ORDER — PREDNISONE (PAK) 10 MG PO TABS: ORAL_TABLET | ORAL | Status: DC

## 2014-02-03 MED ORDER — FLUCONAZOLE 150 MG PO TABS: 150.0000 mg | ORAL_TABLET | Freq: Every day | ORAL | Status: DC

## 2014-02-03 MED ORDER — LEVOFLOXACIN 500 MG PO TABS: 500.0000 mg | ORAL_TABLET | Freq: Every day | ORAL | Status: DC

## 2014-02-03 NOTE — Progress Notes (Signed)
Patient ID: Tiffany Wells, female   DOB: [birth year], 58 y.o.   MRN: [id number]   Patient Active Problem List   Diagnosis Date Noted  . Recurrent bacterial cystitis 02/04/2014  . Non-suppurative otitis media 02/03/2014  . Acute post-hemorrhagic anemia 12/07/2013  . Candida UTI 09/22/2013  . S/P total knee replacement 08/25/2013  . Acute pulmonary embolism 08/06/2013  . Encounter for preventive health examination 05/05/2013  . SVT (supraventricular tachycardia) 12/30/2012  . DJD (degenerative joint disease) of knee 07/15/2012  . Obesity, unspecified 03/20/2012  . Flushing reaction 03/20/2012  . Internal hemorrhoids   . History of Salmonella gastroenteritis   . Hiatal hernia 03/16/2011  . ESSENTIAL HYPERTENSION 02/04/2010    Subjective:  CC:   Chief Complaint  Patient presents with  . Acute Visit    Friday raging headache , sinus drainage  has had fever of an on all weekend.  . Sinusitis    HPI:   Tiffany Wells is a 58 y.o. female who presents for   Headache, clear sinus  Drainage started  on Friday  .  Today started having severe right sided ear pain,  Flew home several days ago from visiting daughter and new grandchild,  Spent a week taking care of other sick grandchildren.  Has a history  of tympanic rupture years ago which occurred after flying with a viral infection .  Having fevers  To 100.4  .  Recurrent cystitis,  Self treated while away with macrodantin,  But developed facial rash after several doses,  So she stopped after 3 days since symptoms of cystitis had alsoe resolved,  Has had recurrence over the last 48 hours.    Past Medical History  Diagnosis Date  . Back pain   . Hypertension   . Normal cardiac stress test     2006, nromal stress cardiolite, EF 65%  . Internal hemorrhoids   . History of Salmonella gastroenteritis 2012    Past Surgical History  Procedure Laterality Date  . Rotator cuff repair    . Tubal ligation    . Colonoscopy   03/07/2011    Int Hemms  . Hysteroscopy    . Upper gastrointestinal endoscopy  03/07/2011    HH Multip Gastric Polyps/ Dr Vira Agar       The following portions of the patient's history were reviewed and updated as appropriate: Allergies, current medications, and problem list.    Review of Systems:   Patient denies headache, fevers, malaise, unintentional weight loss, skin rash, eye pain, sinus congestion and sinus pain, sore throat, dysphagia,  hemoptysis , cough, dyspnea, wheezing, chest pain, palpitations, orthopnea, edema, abdominal pain, nausea, melena, diarrhea, constipation, flank pain, dysuria, hematuria, urinary  Frequency, nocturia, numbness, tingling, seizures,  Focal weakness, Loss of consciousness,  Tremor, insomnia, depression, anxiety, and suicidal ideation.     History   Social History  . Marital Status: Married    Spouse Name: N/A    Number of Children: 2  . Years of Education: N/A   Occupational History  . Glass blower/designer, [employer] History Main Topics  . Smoking status: Never Smoker   . Smokeless tobacco: Never Used  . Alcohol Use: 1.2 oz/week    2 Glasses of wine per week     Comment: very rare  . Drug Use: No  . Sexual Activity: Not on file   Other Topics Concern  . Not on file   Social History Narrative   Lives with husband.  Objective:  Filed Vitals:   02/03/14 1312  BP: 110/78  Pulse: 112  Temp: 99.1 F (37.3 C)  Resp: 16     General appearance: alert, cooperative and appears stated age Ears: Right TM inflamed,  Cloudy,  Left TM normal  Throat: lips, mucosa, and tongue normal; teeth and gums normal, no exudate Neck: no adenopathy, no carotid bruit, supple, symmetrical, trachea midline and thyroid not enlarged, symmetric, no tenderness/mass/nodules Back: symmetric, no curvature. ROM normal. No CVA tenderness. Lungs: clear to auscultation bilaterally Heart: regular rate and rhythm, S1, S2 normal, no murmur, click,  rub or gallop Abdomen: soft, non-tender; bowel sounds normal; no masses,  no organomegaly Pulses: 2+ and symmetric Skin: Skin color, texture, turgor normal. No rashes or lesions Lymph nodes: Cervical, supraclavicular, and axillary nodes normal.  Assessment and Plan:  Non-suppurative otitis media Right ear only .  Secondary to prlonged sinus congestion and sick contacts. Levaquin, prednisone taper and Afrin,  Avoiding Sudafed PE due to SVT  Recurrent bacterial cystitis Empiric antibioitics prescribed,  Culture is pending. Antibiotics will be reviewed once sensitivities of bacterial organism are finalized and patient will be updated.     Updated Medication List Outpatient Encounter Prescriptions as of 02/03/2014  Medication Sig  . cyclobenzaprine (FLEXERIL) 10 MG tablet TAKE 1/2 TO 1 TABLET BY MOUTH 3 TIMES A DAY AS NEEDED FOR BACK PAIN  . diltiazem (CARDIZEM CD) 180 MG 24 hr capsule Take 180 mg by mouth daily.  . diphenhydrAMINE (BENADRYL) 25 MG tablet As needed for allergies.   . diphenoxylate-atropine (LOMOTIL) 2.5-0.025 MG per tablet Take 1 tablet by mouth as needed.  [name] estradiol (ESTRACE) 0.1 MG/GM vaginal cream Place 6.54 Applicatorfuls vaginally daily.  . metoprolol succinate (TOPROL-XL) 25 MG 24 hr tablet TAKE 1 TABLET BY MOUTH TWICE DAILY  . Multiple Vitamin (MULTIVITAMIN) tablet Take 1 tablet by mouth daily.    [name] omeprazole (PRILOSEC OTC) 20 MG tablet Take 20 mg by mouth daily.    Alveda Reasons 20 MG TABS tablet TAKE 1 TABLET BY MOUTH DAILY WITH SUPPER.  . Biotin 300 MCG TABS Take 1 tablet by mouth daily.    [name] CRANBERRY EXTRACT PO 1200 mg a day   . fluconazole (DIFLUCAN) 150 MG tablet Take 1 tablet (150 mg total) by mouth daily.  [name] levofloxacin (LEVAQUIN) 500 MG tablet Take 1 tablet (500 mg total) by mouth daily.  . predniSONE (STERAPRED UNI-PAK) 10 MG tablet 6 tablets on Day 1 , then reduce by 1 tablet daily until gone     Orders Placed This Encounter  Procedures  . Urine  Culture  . Urinalysis, Routine w reflex microscopic  . POCT Urinalysis Dipstick    No Follow-up on file.

## 2014-02-03 NOTE — Progress Notes (Signed)
Pre-visit discussion using our clinic review tool. No additional management support is needed unless otherwise documented below in the visit note.  

## 2014-02-04 DIAGNOSIS — N3090 Cystitis, unspecified without hematuria: Secondary | ICD-10-CM | POA: Insufficient documentation

## 2014-02-04 NOTE — Assessment & Plan Note (Signed)
Empiric antibioitics prescribed,  Culture is pending. Antibiotics will be reviewed once sensitivities of bacterial organism are finalized and patient will be updated.

## 2014-02-04 NOTE — Assessment & Plan Note (Addendum)
Right ear only .  Secondary to prlonged sinus congestion and sick contacts. Levaquin, prednisone taper and Afrin,  Avoiding Sudafed PE due to SVT

## 2014-02-06 LAB — URINE CULTURE: Colony Count: >=100000

## 2014-02-07 ENCOUNTER — Encounter: Payer: Self-pay | Admitting: Internal Medicine

## 2014-02-10 ENCOUNTER — Encounter: Payer: Self-pay | Admitting: Internal Medicine

## 2014-02-11 ENCOUNTER — Other Ambulatory Visit: Payer: Self-pay | Admitting: Internal Medicine

## 2014-02-11 ENCOUNTER — Encounter: Payer: Self-pay | Admitting: Internal Medicine

## 2014-02-11 MED ORDER — HYDROCOD POLST-CHLORPHEN POLST 10-8 MG/5ML PO LQCR: 5.0000 mL | Freq: Two times a day (BID) | ORAL | Status: DC | PRN

## 2014-02-13 ENCOUNTER — Inpatient Hospital Stay: Payer: Self-pay | Admitting: Internal Medicine

## 2014-02-13 LAB — COMPREHENSIVE METABOLIC PANEL
Albumin: 2.7 g/dL — ABNORMAL LOW (ref 3.4–5.0)
Alkaline Phosphatase: 73 U/L
Anion Gap: 8 (ref 7–16)
BUN: 7 mg/dL (ref 7–18)
Bilirubin,Total: 0.4 mg/dL (ref 0.2–1.0)
Calcium, Total: 7.7 mg/dL — ABNORMAL LOW (ref 8.5–10.1)
Chloride: 108 mmol/L — ABNORMAL HIGH (ref 98–107)
Co2: 29 mmol/L (ref 21–32)
Creatinine: 0.79 mg/dL (ref 0.60–1.30)
EGFR (African American): >60
EGFR (Non-African Amer.): >60
Glucose: 102 mg/dL — ABNORMAL HIGH (ref 65–99)
Osmolality: 287 (ref 275–301)
Potassium: 3.5 mmol/L (ref 3.5–5.1)
SGOT(AST): 41 U/L — ABNORMAL HIGH (ref 15–37)
SGPT (ALT): 69 U/L — ABNORMAL HIGH
Sodium: 145 mmol/L (ref 136–145)
Total Protein: 5.6 g/dL — ABNORMAL LOW (ref 6.4–8.2)

## 2014-02-13 LAB — CBC WITH DIFFERENTIAL/PLATELET
Basophil #: 0 10*3/uL (ref 0.0–0.1)
Basophil %: 0.6 %
Eosinophil #: 0.1 10*3/uL (ref 0.0–0.7)
Eosinophil %: 1.3 %
HCT: 37.8 % (ref 35.0–47.0)
HGB: 12.3 g/dL (ref 12.0–16.0)
Lymphocyte #: 1.7 10*3/uL (ref 1.0–3.6)
Lymphocyte %: 25.6 %
MCH: 26.1 pg (ref 26.0–34.0)
MCHC: 32.5 g/dL (ref 32.0–36.0)
MCV: 80 fL (ref 80–100)
Monocyte #: 0.7 x10 3/mm (ref 0.2–0.9)
Monocyte %: 10.5 %
Neutrophil #: 4.2 10*3/uL (ref 1.4–6.5)
Neutrophil %: 62 %
Platelet: 177 10*3/uL (ref 150–440)
RBC: 4.7 10*6/uL (ref 3.80–5.20)
RDW: 15.5 % — ABNORMAL HIGH (ref 11.5–14.5)
WBC: 6.8 10*3/uL (ref 3.6–11.0)

## 2014-02-13 LAB — CREATININE, SERUM
Creatinine: 0.81 mg/dL (ref 0.60–1.30)
EGFR (African American): >60
EGFR (Non-African Amer.): >60

## 2014-02-13 LAB — PROTIME-INR
INR: 1.1
Prothrombin Time: 13.7 secs (ref 11.5–14.7)

## 2014-02-13 LAB — SEDIMENTATION RATE: Erythrocyte Sed Rate: 11 mm/hr (ref 0–30)

## 2014-02-13 LAB — APTT: Activated PTT: 29.6 secs (ref 23.6–35.9)

## 2014-02-15 LAB — CREATININE, SERUM
Creatinine: 0.88 mg/dL (ref 0.60–1.30)
EGFR (African American): >60
EGFR (Non-African Amer.): >60

## 2014-02-15 LAB — HEMOGLOBIN: HGB: 11.3 g/dL — ABNORMAL LOW (ref 12.0–16.0)

## 2014-02-16 ENCOUNTER — Encounter: Payer: Self-pay | Admitting: Specialist

## 2014-02-17 ENCOUNTER — Encounter: Payer: Self-pay | Admitting: Internal Medicine

## 2014-02-17 ENCOUNTER — Telehealth: Payer: Self-pay | Admitting: Internal Medicine

## 2014-02-17 NOTE — Telephone Encounter (Signed)
Can I use 4.30 tomorrow? Hospital follow up?

## 2014-02-17 NOTE — Telephone Encounter (Signed)
Ms. Neuser came to drop off paperwork she received after being discharged from the hospital. I placed the paperwork in Dr. Lupita Dawn box. She said she was discharged from [facility] on 02-16-14. She needs a hospital follow-up appt within two weeks but I didn't see any available. Please call the patient.  Pt ph# [phone] Thank you.

## 2014-02-17 NOTE — Telephone Encounter (Signed)
1130 on Thursday

## 2014-02-17 NOTE — Telephone Encounter (Signed)
Patient aware can you put on schedule 11.30 Thursday?

## 2014-02-17 NOTE — Telephone Encounter (Signed)
Requested discharge summary.

## 2014-02-18 ENCOUNTER — Encounter: Payer: Self-pay | Admitting: Internal Medicine

## 2014-02-18 MED ORDER — NYSTATIN 100000 UNIT/ML MT SUSP: 500000.0000 [IU] | Freq: Four times a day (QID) | OROMUCOSAL | Status: DC

## 2014-02-18 NOTE — Telephone Encounter (Signed)
Discharge date: 02/16/14  Transition Care Management Follow-up Telephone Call  How have you been since you were released from the hospital? Pt states "feeling much better than I did this time last week"    Do you understand why you were in the hospital? YES   Do you understand the discharge instrcutions? YES  Items Reviewed:  Medications reviewed: YES  Allergies reviewed: NO  Dietary changes reviewed: NO  Referrals reviewed: n/a   Functional Questionnaire:   Activities of Daily Living (ADLs):   She states they are independent in the following:  States they require assistance with the following:    Any transportation issues/concerns?: NO   Any patient concerns? NO   Confirmed importance and date/time of follow-up visits scheduled: YES, Nov 5 at 11;30   Confirmed with patient if condition begins to worsen call PCP or go to the ER.  Patient was given the Call-a-Nurse line [phone]: YES

## 2014-02-20 ENCOUNTER — Encounter: Payer: Self-pay | Admitting: Internal Medicine

## 2014-02-20 ENCOUNTER — Ambulatory Visit (INDEPENDENT_AMBULATORY_CARE_PROVIDER_SITE_OTHER): Payer: 59 | Admitting: Internal Medicine

## 2014-02-20 VITALS — BP 117/68 | HR 87 | Temp 97.6°F | Resp 16 | Ht 68.0 in | Wt 257.0 lb

## 2014-02-20 DIAGNOSIS — K559 Vascular disorder of intestine, unspecified: Secondary | ICD-10-CM

## 2014-02-20 DIAGNOSIS — D62 Acute posthemorrhagic anemia: Secondary | ICD-10-CM

## 2014-02-20 NOTE — Progress Notes (Signed)
Pre-visit discussion using our clinic review tool. No additional management support is needed unless otherwise documented below in the visit note.  

## 2014-02-20 NOTE — Progress Notes (Signed)
Patient ID: Tiffany Wells, female   DOB: 10/22/1955, 58 y.o.   MRN: [id number]   Patient Active Problem List   Diagnosis Date Noted  . Ischemic colitis 02/21/2014  . Recurrent bacterial cystitis 02/04/2014  . Non-suppurative otitis media 02/03/2014  . Acute post-hemorrhagic anemia 12/07/2013  . Candida UTI 09/22/2013  . S/P total knee replacement 08/25/2013  . Acute pulmonary embolism 08/06/2013  . Encounter for preventive health examination 05/05/2013  . SVT (supraventricular tachycardia) 12/30/2012  . DJD (degenerative joint disease) of knee 07/15/2012  . Obesity, unspecified 03/20/2012  . Flushing reaction 03/20/2012  . Internal hemorrhoids   . History of Salmonella gastroenteritis   . Hiatal hernia 03/16/2011  . ESSENTIAL HYPERTENSION 02/04/2010    Subjective:  CC:   Chief Complaint  Patient presents with  . Follow-up    hospital Ischemic collitis    HPI:   Tiffany Wells is a 58 y.o. female who presents for  Hospital follow up for ischemic colitis.  Patient was admitted to [facility] on Oct 29 with one week history of cram py abdominal pain accompanied by recurrent hematochezia.  Colonoscopy was done by Vira Agar but did not advance to the ileocecal valve  due to severe inflammation   ESR was normal but CGRP was 82.   She underwent evaluation by  Vascular Surgery for ischemic colitis found on biopsy of colon.  No arterial disease was found.  During the admission her Hgb dropped from 12.3 to `11.3  She was discharged on a low residue diet   She has had no recurrent pian or hematochezia since discharge on Nov 1.  She recalls that the last three episodes of what she presumed was IBS may have been recurrent bouts of ischemic colitis given their severity.  She has lost 15 lbs since August, which as been intentional.       Past Medical History  Diagnosis Date  . Back pain   . Hypertension   . Normal cardiac stress test     2006, nromal stress cardiolite, EF 65%  .  Internal hemorrhoids   . History of Salmonella gastroenteritis 2012    Past Surgical History  Procedure Laterality Date  . Rotator cuff repair    . Tubal ligation    . Colonoscopy  03/07/2011    Int Hemms  . Hysteroscopy    . Upper gastrointestinal endoscopy  03/07/2011    HH Multip Gastric Polyps/ Dr Vira Agar       The following portions of the patient's history were reviewed and updated as appropriate: Allergies, current medications, and problem list.    Review of Systems:   Patient denies headache, fevers, malaise, unintentional weight loss, skin rash, eye pain, sinus congestion and sinus pain, sore throat, dysphagia,  hemoptysis , cough, dyspnea, wheezing, chest pain, palpitations, orthopnea, edema, abdominal pain, nausea, melena, diarrhea, constipation, flank pain, dysuria, hematuria, urinary  Frequency, nocturia, numbness, tingling, seizures,  Focal weakness, Loss of consciousness,  Tremor, insomnia, depression, anxiety, and suicidal ideation.     History   Social History  . Marital Status: Married    Spouse Name: N/A    Number of Children: 2  . Years of Education: N/A   Occupational History  . 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 Smokeless tobacco: Never Used  . Alcohol Use: 1.2 oz/week    2 Glasses of wine per week     Comment: very rare  .  Drug Use: No  . Sexual Activity: Not on file   Other Topics Concern  . Not on file   Social History Narrative   Lives with husband.    Objective:  Filed Vitals:   02/20/14 1300  BP: 117/68  Pulse: 87  Temp: 97.6 F (36.4 C)  Resp: 16     General appearance: pale, alert, cooperative and appears stated age Back: symmetric, no curvature. ROM normal. No CVA tenderness. Lungs: clear to auscultation bilaterally Heart: regular rate and rhythm, S1, S2 normal, no murmur, click, rub or gallop Abdomen: soft, non-tender; bowel sounds normal; no masses,   no organomegaly Pulses: 2+ and symmetric Skin: Skin color, texture, turgor normal. No rashes or lesions Lymph nodes: Cervical, supraclavicular, and axillary nodes normal.  Assessment and Plan:  Ischemic colitis Etiology unclear.  Vascular evaluation with CT angiography  failed to find any significant arterial disease .  May have been precipitated by constipation or transient hypotension.  Follow up with GI in a few weeks,  Recurrent episodes will require left hemi colectomy.    Acute post-hemorrhagic anemia Resolved, then recurrent with recent episode of lower GI bleed. hgb was 11.3 at dc on Nov 1.    Updated Medication List Outpatient Encounter Prescriptions as of 02/20/2014  Medication Sig  . Biotin 300 MCG TABS Take 1 tablet by mouth daily.    . chlorpheniramine-HYDROcodone (TUSSIONEX) 10-8 MG/5ML LQCR Take 5 mLs by mouth every 12 (twelve) hours as needed for cough.  . cyclobenzaprine (FLEXERIL) 10 MG tablet TAKE 1/2 TO 1 TABLET BY MOUTH 3 TIMES A DAY AS NEEDED FOR BACK PAIN  . diltiazem (CARDIZEM CD) 180 MG 24 hr capsule Take 180 mg by mouth daily.  . diphenhydrAMINE (BENADRYL) 25 MG tablet As needed for allergies.   . diphenoxylate-atropine (LOMOTIL) 2.5-0.025 MG per tablet Take 1 tablet by mouth as needed.  [name] estradiol (ESTRACE) 0.1 MG/GM vaginal cream Place 2.35 Applicatorfuls vaginally daily.  . fluconazole (DIFLUCAN) 150 MG tablet Take 1 tablet (150 mg total) by mouth daily.  [name] levofloxacin (LEVAQUIN) 500 MG tablet Take 1 tablet (500 mg total) by mouth daily.  . metoprolol succinate (TOPROL-XL) 25 MG 24 hr tablet TAKE 1 TABLET BY MOUTH TWICE DAILY  . metroNIDAZOLE (FLAGYL) 500 MG tablet Take 500 mg by mouth 3 (three) times daily.  . Multiple Vitamin (MULTIVITAMIN) tablet Take 1 tablet by mouth daily.    [name] nystatin (MYCOSTATIN) 100000 UNIT/ML suspension Take 5 mLs (500,000 Units total) by mouth 4 (four) times daily.  [name] omeprazole (PRILOSEC OTC) 20 MG tablet Take 20 mg by mouth  daily.    . predniSONE (STERAPRED UNI-PAK) 10 MG tablet 6 tablets on Day 1 , then reduce by 1 tablet daily until gone  . XARELTO 20 MG TABS tablet TAKE 1 TABLET BY MOUTH DAILY WITH SUPPER.  [name] CRANBERRY EXTRACT PO 1200 mg a day      No orders of the defined types were placed in this encounter.    No Follow-up on file.

## 2014-02-20 NOTE — Patient Instructions (Signed)
Premier Protein shakes ([employer])  160 cal 4 nmt car  30 g protein

## 2014-02-21 DIAGNOSIS — K559 Vascular disorder of intestine, unspecified: Secondary | ICD-10-CM

## 2014-02-21 HISTORY — DX: Vascular disorder of intestine, unspecified: K55.9

## 2014-02-21 NOTE — Assessment & Plan Note (Signed)
Resolved, then recurrent with recent episode of lower GI bleed. hgb was 11.3 at dc on Nov 1.

## 2014-02-21 NOTE — Assessment & Plan Note (Signed)
Etiology unclear.  Vascular evaluation with CT angiography  failed to find any significant arterial disease .  May have been precipitated by constipation or transient hypotension.  Follow up with GI in a few weeks,  Recurrent episodes will require left hemi colectomy.

## 2014-02-26 ENCOUNTER — Telehealth: Payer: Self-pay | Admitting: Internal Medicine

## 2014-02-26 NOTE — Telephone Encounter (Signed)
Pt dropped off card for Dr. Derrel Nip. Card in Dr. Lupita Dawn box/msn

## 2014-02-26 NOTE — Telephone Encounter (Signed)
Placed in red folder  

## 2014-03-03 ENCOUNTER — Other Ambulatory Visit: Payer: Self-pay | Admitting: Internal Medicine

## 2014-03-17 DIAGNOSIS — K559 Vascular disorder of intestine, unspecified: Secondary | ICD-10-CM

## 2014-03-17 DIAGNOSIS — D62 Acute posthemorrhagic anemia: Secondary | ICD-10-CM

## 2014-03-19 ENCOUNTER — Encounter: Payer: Self-pay | Admitting: Internal Medicine

## 2014-04-09 ENCOUNTER — Other Ambulatory Visit: Payer: Self-pay | Admitting: *Deleted

## 2014-04-09 ENCOUNTER — Other Ambulatory Visit: Payer: Self-pay | Admitting: General Surgery

## 2014-04-09 ENCOUNTER — Encounter: Payer: Self-pay | Admitting: General Surgery

## 2014-04-09 DIAGNOSIS — N390 Urinary tract infection, site not specified: Secondary | ICD-10-CM

## 2014-04-09 MED ORDER — NITROFURANTOIN MONOHYD MACRO 100 MG PO CAPS: 100.0000 mg | ORAL_CAPSULE | Freq: Two times a day (BID) | ORAL | Status: DC

## 2014-04-09 NOTE — Progress Notes (Signed)
Patient having UTI symptoms. Will send order for urine culture and Macrobid 100 mg PO one by mouth BID #14 no refills.

## 2014-04-11 LAB — URINE CULTURE

## 2014-04-24 ENCOUNTER — Encounter: Payer: Self-pay | Admitting: *Deleted

## 2014-04-24 NOTE — Telephone Encounter (Signed)
From: [name] Sent: 01/27/2014 9:08 AM EDT To: Aviva Kluver Subject: RE: Flu Shot Clinic Saturday, 02/01/14 [place]  Flu shot completed through [employer] on 01/05/14.  ----- Message ----- From: Genia Plants Sent: 01/27/14, 8:44 AM To: Ames Dura Subject: Flu Shot Clinic Saturday, 02/01/14 [facility] will hold a [employer] on Saturday, October 17 from 9 am to noon. In order to plan appropriately, we ask you to please call the office to schedule an appointment time during the clinic. Our schedulers are ready to assist you, Monday through Friday, 8a to 5p at [phone]. Thank you for choosing [facility] for your healthcare needs.

## 2014-05-01 ENCOUNTER — Other Ambulatory Visit: Payer: Self-pay | Admitting: General Surgery

## 2014-05-01 ENCOUNTER — Other Ambulatory Visit: Payer: Self-pay | Admitting: *Deleted

## 2014-05-01 DIAGNOSIS — N390 Urinary tract infection, site not specified: Secondary | ICD-10-CM

## 2014-05-05 ENCOUNTER — Other Ambulatory Visit: Payer: Self-pay | Admitting: Internal Medicine

## 2014-05-05 DIAGNOSIS — N342 Other urethritis: Secondary | ICD-10-CM

## 2014-05-05 DIAGNOSIS — N390 Urinary tract infection, site not specified: Secondary | ICD-10-CM | POA: Insufficient documentation

## 2014-05-05 LAB — URINE CULTURE

## 2014-05-05 MED ORDER — CULTURELLE DIGESTIVE HEALTH PO CAPS: 1.0000 | ORAL_CAPSULE | Freq: Every day | ORAL | Status: DC

## 2014-05-05 MED ORDER — FLUCONAZOLE 150 MG PO TABS: 150.0000 mg | ORAL_TABLET | Freq: Every day | ORAL | Status: DC

## 2014-05-05 MED ORDER — CIPROFLOXACIN HCL 250 MG PO TABS: 250.0000 mg | ORAL_TABLET | Freq: Two times a day (BID) | ORAL | Status: DC

## 2014-05-25 ENCOUNTER — Encounter: Payer: Self-pay | Admitting: Internal Medicine

## 2014-05-26 ENCOUNTER — Encounter: Payer: Self-pay | Admitting: Internal Medicine

## 2014-05-26 MED ORDER — PB-HYOSCY-ATROPINE-SCOPOL ER 48.6 MG PO TBCR: 1.0000 | EXTENDED_RELEASE_TABLET | Freq: Every day | ORAL | Status: DC | PRN

## 2014-05-26 MED ORDER — DIPHENOXYLATE-ATROPINE 2.5-0.025 MG PO TABS: 1.0000 | ORAL_TABLET | ORAL | Status: DC | PRN

## 2014-05-26 NOTE — Telephone Encounter (Signed)
Called to pharmacy, transmission failed again to pharmacy.

## 2014-05-26 NOTE — Telephone Encounter (Signed)
Yes please send over  To pharmacy

## 2014-05-26 NOTE — Telephone Encounter (Signed)
See mychart message, requesting refills

## 2014-05-26 NOTE — Telephone Encounter (Signed)
Re sent, e-prescribing error.

## 2014-07-16 ENCOUNTER — Other Ambulatory Visit: Payer: Self-pay | Admitting: *Deleted

## 2014-07-16 ENCOUNTER — Encounter: Payer: Self-pay | Admitting: Internal Medicine

## 2014-07-18 MED ORDER — ESTRADIOL 0.1 MG/GM VA CREA: 2.0000 g | TOPICAL_CREAM | Freq: Every day | VAGINAL | Status: DC

## 2014-08-05 ENCOUNTER — Other Ambulatory Visit: Payer: Self-pay | Admitting: Internal Medicine

## 2014-08-05 NOTE — Telephone Encounter (Signed)
Last refill 10.27.15, last OV 11.5.15 ; advise refill

## 2014-08-06 NOTE — Telephone Encounter (Signed)
Ok to refill,  Refill sent  

## 2014-08-09 NOTE — Consult Note (Signed)
PATIENT NAME:  Tiffany Wells, Tiffany Wells MR#:  [id number] DATE OF BIRTH:  [birth year]  DATE OF CONSULTATION:  11/21/2013  REFERRING PHYSICIAN: Dr. Christophe Louis.  CONSULTING PHYSICIAN:  Isaias Cowman, MD  PRIMARY CARE PHYSICIAN:  Dr. Derrel Nip.  CARDIOLOGIST:  Dr. Ubaldo Glassing.  REASON FOR CONSULTATION: Orthostasis.   HISTORY OF PRESENT ILLNESS: The patient is a 59 year old female with history of paroxysmal SVT, DVT, and hypertension. The patient underwent left total knee arthroplasty on 11/19/2013. The patient denies chest pain, shortness of breath, recurrent tachycardia, or supraventricular tachycardia. The patient has been undergoing physical therapy, and when the patient goes from a sitting or lying position and stands, she becomes orthostatic with a drop in blood pressure and elevation in heart rate. Blood pressures have been running 91/56 to 125/71 at baseline. The patient currently is taking diltiazem ER 120 mg daily and metoprolol succinate ER 25 mg b.i.d.   PAST MEDICAL HISTORY:  1.  Supraventricular tachycardia.  2.  Hypertension.  3.  Osteoarthritis.  4.  History of DVT and pulmonary embolus.   MEDICATIONS ON ADMISSION: Xarelto 20 mg daily, diltiazem CD 180 mg daily, metoprolol succinate 25 mg b.i.d., biotin 300 mcg daily, cyclobenzaprine 10 mg daily, diphenhydramine 25 mg daily p.r.n., estradiol vaginal cream, and omeprazole 20 mg daily.   SOCIAL HISTORY: The patient is married. She has 2 grown children. She has never smoked.   FAMILY HISTORY: No immediate family history of coronary artery disease or myocardial infarction.   REVIEW OF SYSTEMS:  CONSTITUTIONAL: No fever or chills.  EYES: No blurry vision.  EARS: No hearing loss.  RESPIRATORY: No shortness of breath.  CARDIOVASCULAR: The patient currently denies chest pain and shortness of breath.  GASTROINTESTINAL: No nausea, vomiting, or diarrhea.  GENITOURINARY: No dysuria or hematuria.  ENDOCRINE: No polyuria or polydipsia.   MUSCULOSKELETAL: No arthralgias or myalgias.  NEUROLOGICAL: No focal muscle weakness or numbness.  PSYCHOLOGICAL: No depression or anxiety.   PHYSICAL EXAMINATION:  VITAL SIGNS: Blood pressure 101/67, pulse 106, temperature 97.6, respirations 18, pulse oximetry 97%.  HEENT: Pupils equal and reactive to light and accommodation.  NECK: Supple without thyromegaly.  LUNGS: Clear.  HEART: Normal JVP. Normal PMI. Regular rate and rhythm. Normal S1, S2. No appreciable gallop, murmur, or rub.  ABDOMEN: Soft and nontender. Pulses were intact bilaterally.  MUSCULOSKELETAL: Normal muscle tone.  NEUROLOGIC: The patient is alert and oriented x 3. Motor and sensory both grossly intact.   IMPRESSION: A 59 year old female, status post knee surgery, with orthostatic symptoms which is not uncommon following surgical procedure while patient is on antihypertensive medications, as well as medications for supraventricular tachycardia.   RECOMMENDATIONS:  1.  Would continue current medications.  2.  Be sure patient is a well-hydrated.  3.  Would defer any further cardiac diagnosis at this time.  4.  It is important to have patient slowly go from lying or sitting to standing position before embarking on physical therapy. Overall, her symptoms should improve as the hospitalization  progresses and she recovers from her knee surgery with decrease in pain.  ____________________________ Isaias Cowman, MD ap:ts D: 11/21/2013 17:31:02 ET T: 11/21/2013 18:05:30 ET JOB#: [id number]  cc: Isaias Cowman, MD, <Dictator> Isaias Cowman MD ELECTRONICALLY SIGNED 11/26/2013 13:57

## 2014-08-09 NOTE — Consult Note (Signed)
Pt doing well, still loose stools. VSS afeb, chest clear, heart RRR, abd non tender.  had a good nite.  On full liq diet.  Doing very well, discussed with Dr. Bary Castilla and Dr, Jamal Collin, likely home tomorrow on antibiotics and ful liq diet and advance to low residue after that.  Follow up colonoscopy to be considered in a few months.  Electronic Signatures: Manya Silvas (MD)  (Signed on 31-Oct-15 09:25)  Authored  Last Updated: 31-Oct-15 09:25 by Manya Silvas (MD)

## 2014-08-09 NOTE — H&P (Signed)
PATIENT NAME:  Tiffany Wells, Tiffany Wells MR#:  [id number] DATE OF BIRTH:  [birth year]  DATE OF ADMISSION:  02/13/2014  REFERRING PHYSICIAN:   Manya Silvas, MD   CHIEF COMPLAINT: Abdominal pain with blood in stools.   HISTORY OF PRESENT ILLNESS:  A 59 year old female patient with history of deep vein thrombosis, PE on IVC filter, osteoarthritis, SVT, hypertension, who has been having some crampy abdominal pain for the past few months along with some blood in her stools. Initially had a CT of the abdomen done 2 weeks back.  This was found to be normal other than a simple ovarian cyst. But, considering her ongoing crampy pain with some blood in stool, ischemic colitis was suspected.  She had a colonoscopy done by Dr. Vira Agar which showed findings of ischemic colitis. The patient is being admitted to the hospitalist service. The patient presently complains of some mild crampy abdominal pain, but no nausea or vomiting. No shortness of breath. No lightheadedness, dizziness, She does not have any dysphagia. She has been on Xarelto for her DVT/PE, has not taken it for 2 days.  Biopsies were taken today during colonoscopy. Case discussed with Dr. Vira Agar and the patient's husband. Dr. Bary Castilla.   The patient also mentions that she has had irritable bowel syndrome since being young, and has had diarrhea on and off all through her life, depending on what kind of diet she eats.   PAST MEDICAL HISTORY:  1.  DVT-PE, status post IVC filter.  2.  Osteoarthritis.  3.  SVT.  4.  Hypertension.  5.  Irritable bowel syndrome.  6.  Knee replacement.   ALLERGIES: CODEINE AND TAPE.   SOCIAL HISTORY: The patient does not smoke. No alcohol. No illicit drug use. Ambulates on her own.   FAMILY HISTORY: Myeloma in father along with heart issues, peripheral vascular disease and hypertension in her mother, testicular cancer in her brother.    REVIEW OF SYSTEMS:  CONSTITUTIONAL: Complains of some fatigue.  EYES: No blurred  vision, pain  RESPIRATORY: No cough, wheeze, hemoptysis. She is just getting over bronchitis, which has resolved.  CARDIOVASCULAR: No chest pain or orthopnea. Does have history of supraventricular tachycardia.  GASTROINTESTINAL: Has crampy abdominal pain on-and-off diarrhea with some blood. GENITOURINARY:  No dysuria, hematuria, frequency.  ENDOCRINE: No polyuria, nocturia, or thyroid.  HEMATOLOGIC AND LYMPHATIC: No anemia, easy bruising, bleeding.  INTEGUMENTARY: No acne, rash, lesion.  MUSCULOSKELETAL: No back pain. Does have arthritis.  NEUROLOGIC: No focal numbness, weakness.  PSYCHIATRIC:  No anxiety or depression.   HOME MEDICATIONS:  A complete list is not available at this time, but the patient does take metoprolol and Cardizem at home along with Xarelto.   PHYSICAL EXAMINATION:  VITAL SIGNS: Temperature 98, blood pressure 135/60, pulse of 95, respirations 16, saturating 98% on room air.  GENERAL: Obese, Caucasian female patient lying in bed, seems comfortable, conversational, cooperative with exam.  PSYCHIATRIC: Alert, oriented x 3. Mood and affect appropriate. Judgment intact.  HEENT: Atraumatic, normocephalic. Oral mucosa moist and pink. External ears and nose normal. No pallor or icterus. Pupils bilaterally equal and reactive to light.  NECK: Supple. No thyromegaly. No palpable lymph nodes. Trachea midline. No jugular venous distention.  CARDIOVASCULAR: S1, S2, without any murmurs. Peripheral pulses 2+.  RESPIRATORY: Normal work of breathing. Clear to auscultation on both sides.  GASTROINTESTINAL: Soft abdomen. Tenderness diffusely no discrete guarding. Bowel sounds present.  No hepatosplenomegaly palpable.  SKIN: Warm and dry. No petechiae, rash, ulcers.  MUSCULOSKELETAL: No  joint swelling, redness, effusion of large joints. Normal muscle tone.  NEUROLOGICAL: Motor strength 5/5 in upper and lower extremities. Sensation is intact all over.  LYMPHATIC: No cervical lymphadenopathy.    LABORATORY DATA:  Still pending.   ASSESSMENT AND PLAN:  1.  Ischemic colitis in a patient with crampy abdominal pain and some bloody stools on and off.  The case has been discussed with Dr. Vira Agar of gastroenterology.  At this point, was started on Zosyn. Continue the Xarelto.  Vascular surgery Dr. Delana Meyer is aware of the case who will be seeing the patient. We will check stat labs and put her on tele and monitor closely. The patient's pulmonary embolism was provoked by a knee surgery, and now with ischemic colitis hypercoagulability needs to be considered. The patient will be on clear liquids at this point.  2.  Hypertension, supraventricular tachycardia continue her home medications.  A complete list available.  3.  Deep vein prophylaxis. The patient is on Xarelto.    CODE STATUS: FULL CODE.   TIME SPENT ON THIS CASE:  35 minutes.      ____________________________ Leia Alf Rayman Petrosian, MD srs:DT D: 02/13/2014 16:03:09 ET T: 02/13/2014 16:37:12 ET JOB#: [id number]  cc: Alveta Heimlich R. Brison Fiumara, MD, <Dictator> Deborra Medina, MD Manya Silvas, MD Katha Cabal, MD Javier Docker Ubaldo Glassing, MD Neita Carp MD ELECTRONICALLY SIGNED 02/20/2014 15:04

## 2014-08-09 NOTE — Consult Note (Signed)
   Present Illness 59 yo female with history of svt as well as dvt with vena caval filter in place who recently hasd knee surgery. She was recently admitted with anemia and had svt duriing that admission. Today she was noted to have rapid heart rate. She had a rate of 150-190 at home.SHe had taken her cardizem 180 cd and metoprolol succinate 25 mg this am. She had a perscription for cardizem 30 mg called in and she took one of those with no imiprovement. She was brought to the er where her heart rate was 190. It apeared to be sinus tach vs afib with rvr. She ias relatively hemodynamically stable wtih sbp 98/85. She has some chest tightness but otherwise sable. EKG shows narrow complex tachcyardia. Was given iv cardizem 10 mg with some transient improvement . IV digoxin 0.25 also given. Still with irregular heart rate at rates of 140-165. Will admit to ccu and place on iv cardizem drip.   Physical Exam:  GEN well nourished   HEENT PERRL, hearing intact to voice   NECK supple   RESP normal resp effort  clear BS  no use of accessory muscles   CARD Irregular rate and rhythm  Tachycardic   ABD normal BS   NEURO cranial nerves intact, motor/sensory function intact   PSYCH A+O to time, place, person   Review of Systems:  Subjective/Chief Complaint rapid heart rate   General: Fatigue  Weakness   Skin: No Complaints   Neck: No Complaints   Respiratory: Short of breath   Cardiovascular: Tightness  Palpitations   Gastrointestinal: No Complaints   Genitourinary: No Complaints   Vascular: No Complaints   Musculoskeletal: No Complaints   Neurologic: No Complaints   Hematologic: No Complaints   Endocrine: No Complaints   Psychiatric: No Complaints   Review of Systems: All other systems were reviewed and found to be negative   Medications/Allergies Reviewed Medications/Allergies reviewed   Family & Social History:  Family and Social History:  Family History Non-Contributory    EKG:  Interpretation st vs svt wirht probable afib/flutter with rvr    Codeine: Unknown  adhesive tape: Rash   Impression 59 yo female with history of dvt with vena caval filter in place and on xarelto for anticoagulation who has history of svt now presenting with probable afib with rvr. recieved iv cardizem bolus. Had po cardizem at home this am along with her metoprolol succinate 25 bid. Will load with digoxin and place on cardizem drip. Review labs when available.   Plan 1. Admit to ccu 2. Cardizem drip starting at 5 mg/hr 3. IV digoxin load 4. Metoprolol tartarte 25 qid 5. resume other home meds   Electronic Signatures: Teodoro Spray (MD)  (Signed 15-Aug-15 15:13)  Authored: General Aspect/Present Illness, History and Physical Exam, Review of System, Family & Social History, EKG , Allergies, Impression/Plan   Last Updated: 15-Aug-15 15:13 by Teodoro Spray (MD)

## 2014-08-09 NOTE — Discharge Summary (Signed)
PATIENT NAME:  Tiffany Wells, HOCH MR#:  [id number] DATE OF BIRTH:  1956-02-08  DATE OF ADMISSION:  06/14/2013 DATE OF DISCHARGE:  06/19/2013  DISCHARGE DIAGNOSES:  1.  Severe degenerative arthritis, right knee.  2.  Postoperative venous thrombosis and pulmonary embolism.  3.  History of sinus tachycardia.  4.  History of hypertension.  5.  Osteoarthritis.  OPERATIONS/PROCEDURES PERFORMED: Right total knee arthroplasty with LCS components.   HISTORY AND PHYSICAL EXAMINATION: As written on admission.   LABORATORY DATA: As noted in the chart.   COURSE IN HOSPITAL: Following appropriate laboratory evaluation and medical clearance, the patient was taken to the operating room on 06/14/2013, where right total knee arthroplasty was performed without difficulty. The patient tolerated the procedure quite well. Postoperatively, the patient had a moderate amount of pain associated with nausea. Standard physical therapy protocol was begun and the patient developed significant tachycardia up into the 170 per minute range. Medical consultation was obtained. All laboratory values were within normal limits. CT angiogram of the lungs demonstrated pulmonary embolism associated with the right upper and lower lobes. Subsequent to this, the patient was initiated on therapeutic Lovenox anticoagulation. The following day, venous Doppler examinations demonstrated a venous thrombosis in the popliteal vessel of the right knee. Vascular consultation recommended no active treatment after the patient had been anticoagulated for 24 hours. Cardiology consultation was obtained with echocardiogram and no organic cardiac problems were identified. The patient was converted over to Xarelto as anticoagulation for her pulmonary embolism and venous thrombosis. The patient is discharged to her home on home health physical therapy with the usual physical therapy protocol. Range of motion to the knee is to be resumed. She may be fully  weight-bearing using the walker. She is to resume her previous medications as taken at home. She was converted over to Cardizem versus metoprolol for her tachycardia. She was given the appropriate dose of Xarelto for the pulmonary embolism problem. She is to follow up in the office in 10 to 12 days for physical examination, x-ray and probable staple removal.  ____________________________ Lucas Mallow, MD ces:aw D: 07/03/2013 08:42:06 ET T: 07/03/2013 09:03:41 ET JOB#: [id number]  cc: Lucas Mallow, MD, <Dictator> Lucas Mallow MD ELECTRONICALLY SIGNED 07/04/2013 12:50

## 2014-08-09 NOTE — Op Note (Signed)
PATIENT NAME:  Tiffany Wells, Tiffany Wells MR#:  [id number] DATE OF BIRTH:  01-13-1956  DATE OF PROCEDURE:  06/14/2013  PREOPERATIVE DIAGNOSIS: Severe degenerative arthritis, right knee.   POSTOPERATIVE DIAGNOSIS: Severe degenerative arthritis, right knee.   PROCEDURE: LCS right total knee arthroplasty.   SURGEON: Christophe Louis, MD.   ASSISTANT: Earnestine Leys, MD.   ANESTHESIA: Spinal.   COMPLICATIONS: None.  TOURNIQUET TIME: 105 minutes.   DESCRIPTION OF PROCEDURE: Two grams of Ancef were given intravenously prior to the procedure. Spinal anesthesia is induced. Foley catheter is inserted. The right lower extremity is thoroughly prepped with alcohol and ChloraPrep and draped in standard sterile fashion. The extremity is wrapped out with the Esmarch bandage and pneumatic tourniquet elevated to 350 mmHg. A standard anterior longitudinal incision is made and the dissection carried down to the medial and lateral retinaculum. Medial parapatellar incision is then made and the knee is flexed and the patella is reflected laterally. Standard synovial and hypertrophic spur debridement is performed. Retractors were placed revealing the proximal tibia. Proximal tibial cutter is put into place and pinned and seen to be in good position. Proximal tibial cut is made. Distal femur is sized as a large. The large cutting block is put into position and a central drill hole made. Femoral rotation guide is put into position and the alignment is seen to be satisfactory. Cutting block is pinned. Anterior and posterior cuts are then made off of the distal femur. The 5 degree valgus distal femoral cutting block is then impacted into place and pinned and the appropriate cut made. A 10 mm spacer block is put in, both in flexion and extension and is seen to be satisfactory with good alignment and stability. It should be noted that prior to all of this, moderate medial release was performed off of the proximal tibia. Femoral  shaping guide is then impacted into place and the appropriate drill holes and cuts made. Proximal tibia is then revealed and some posterior debridement is performed. The #3 tibial tray is then chosen and pinned. The central drill hole is made. Trial components are then inserted and seen to show satisfactory alignment and stability. The patella is then prepared in the usual manner for press fitting. All trial components are then removed. The wound and joint are thoroughly irrigated multiple times throughout the case with pulsatile lavage. At the proximal tibia, the #3 tibia is cemented into place with a 10 mm rotating platform polyethylene insert. The porous-coated large femur is then impacted into place with some cement in the supracondylar area and also in the small locating holes. Patella is then press fit. Range of motion is excellent without any subluxation of the patella. The joint is thoroughly irrigated multiple times again. Two Autovac drains were brought out through separate stab wound incisions. Medial retinaculum is then repaired with #2 Tycron. The subcutaneous tissue is closed with 2-0 Vicryl. The skin is closed with the skin stapler. A soft bulky dressing with a Polar Care and a knee immobilizer are applied. The tourniquet is released and the patient is returned to the recovery room in satisfactory condition having tolerated the procedure quite well.   ____________________________ Lucas Mallow, MD ces:ce D: 06/14/2013 11:21:10 ET T: 06/14/2013 12:37:16 ET JOB#: [id number]  cc: Lucas Mallow, MD, <Dictator> Lucas Mallow MD ELECTRONICALLY SIGNED 06/16/2013 13:00

## 2014-08-09 NOTE — Consult Note (Signed)
PATIENT NAME:  Tiffany Wells, Tiffany Wells MR#:  [id number] DATE OF BIRTH:  Aug 25, 1955  DATE OF CONSULTATION:  06/17/2013  REFERRING PHYSICIAN:  Thornton Park, MD CONSULTING PHYSICIAN:  Jorel Gravlin H. Posey Pronto, MD  REASON FOR CONSULTATION: Elevated heart rate.   HISTORY OF PRESENT ILLNESS: The patient is a 59 year old white female with history of osteoarthritis and hypertension who underwent right total knee replacement on the 27th. The patient subsequently was doing okay, but then starting on February 28th she had an episode of heart rate being 115 and then slowly her heart rate has trended upwards. Today heart rate got into the 142s. The patient on further questioning reports that she does have a history of having SVT in the past and has followed up with Dr. Ubaldo Glassing. During the summer of 2014 had a work-up including a stress test and echo, which were nonrevealing. The patient also reports that she sees Dr. Derrel Nip and has had issues with her heart rate with activity, especially over the past few months, but at rest usually is in the 90s. She otherwise denies any chest pain or shortness of breath. No has some nausea, but no vomiting or diarrhea. Denies any urinary symptoms or any calf pain.   PAST MEDICAL HISTORY: Significant for hypertension, osteoarthritis, and history of SVT in the past.   PAST SURGICAL HISTORY: Status post left shoulder surgery.   ALLERGIES: CODEINE AND TAPE.   HOME MEDICATIONS: She is on Antivert 25 mg q. 6 p.r.n., Benadryl 25 mg 1 tab p.o. t.i.d., biotin 300 daily, cranberry 5 mg b.i.d., Donnatal 1 tab p.o. b.i.d., Estrace vaginal 1 gram daily, Flexeril 0.5 to 1 mg t.i.d. as needed, K-Dur 20 mEq daily, Lomotil 0.25 to 2.5 two tabs 4 times a day as needed, Prilosec 20 daily, Tessalon Perles 100 mg two 3 times a day as needed, Toprol-XL 25 mg 1 tab p.o. b.i.d., Tussionex suspension 5 mL q. 12 p.r.n., Vicodin 1 tab p.o. b.i.d. as needed.   SOCIAL HISTORY: Does not smoke. Does not drink. No drugs.    FAMILY HISTORY: Peripheral vascular disease in the mother. Father with coronary artery disease with bypass.  REVIEW OF SYSTEMS: CONSTITUTIONAL: Denies any fevers, fatigue, or weakness. No significant pain related to surgery. No weight loss or weight gain.  EYES: No blurred or double vision. No pain. No redness. No inflammation. No glaucoma. No cataracts.  ENT: No tinnitus. No ear pain. No hearing loss. No seasonal or year-round allergies. No epistaxis. No difficulty swallowing.  RESPIRATORY: Denies any cough, wheezing, hemoptysis. No dyspnea.  CARDIOVASCULAR: Denies any chest pain, orthopnea, edema. History of SVT. No dyspnea on exertion. No palpitation. No syncope.  GASTROINTESTINAL: Complains of some nausea, but no vomiting or diarrhea. No abdominal pain. No hematemesis. No melena.  GENITOURINARY: Denies any dysuria, hematuria, renal calculus or frequency.  ENDOCRINE: Denies any polyuria, nocturia or thyroid problems.  HEMATOLOGIC AND LYMPH: Denies any major bruisability or bleeding.  SKIN: No acne. No rash. No changes in mole, hair or skin.  MUSCULOSKELETAL: Has a history of osteoarthritis. NEUROLOGIC: No numbness, CVA, TIA, or seizures.  PSYCHIATRIC: No anxiety, insomnia or ADD.   PHYSICAL EXAMINATION: VITAL SIGNS: Temperature 97.8, pulse 133, respirations 18, blood pressure 119/67, O2 93%.  GENERAL: The patient is a well developed, well nourished female in no acute distress.  HEENT: Head atraumatic, normocephalic. Pupils equally round and reactive to light and accommodation. There is no conjunctival pallor. No scleral icterus. Nasal exam shows no drainage or ulceration. Oropharynx is clear without  any exudate.  NECK: Supple without any JVD.  CARDIOVASCULAR: Regular rate and rhythm. No murmurs, rubs, clicks, or gallops.  LUNGS: Clear to auscultation bilaterally without any rales, rhonchi, or wheezing.  ABDOMEN: Soft, nontender, nondistended. Positive bowel sounds x4.  EXTREMITIES: No  clubbing, cyanosis, or edema.  SKIN: No rash.  LYMPHATICS: No lymph nodes palpable.  VASCULAR: Good DP and PT pulses.  PSYCHIATRIC: Not anxious or depressed.  NEUROLOGIC: Awake, alert and oriented x3. No focal deficits.   LABORATORY DATA: Glucose 117, BUN 9, creatinine 0.75, sodium 137, potassium 3.8, chloride 102, CO2 31, calcium 8.3. Magnesium 1.7. TSH 3.51. Hemoglobin 9.8. D-dimer 2.87. ABG: PH 7.53, pCO2 33, pO2 84.   ASSESSMENT AND PLAN: The patient is a 59 year old white female with history of supraventricular tachycardia in the past, abnormal heart rate in the past, status post total knee replacement, now has heart rate in the 130s to 140s.  1.  Sinus tachycardia. No evidence of infection. No fever. Hemoglobin in the 9.7 range so this is unlikely due to anemia. At this point, we need to rule out pulmonary embolus. We will get a CT for pulmonary embolus. I am going to change the metoprolol to Cardizem because she has not responded to this in terms of her heart rate in the past at home. If her heart rate continues to be elevated, we will ask cardiology to see. She has echo within the past few months so we will not repeat. Her TSH is normal.  2.  Hypertension. Will be on Cardizem. Follow her blood pressure.  3.  Hypomagnesemia. We will replace her magnesium.  4.  Acute blood loss anemia. Follow hemoglobin in the morning.  5.  Miscellaneous. Agree with Lovenox for deep vein thrombosis prophylaxis.  TIME SPENT: 50 minutes.  ____________________________ Lafonda Mosses Posey Pronto, MD shp:sb D: 06/17/2013 18:15:44 ET T: 06/18/2013 07:09:26 ET JOB#: [id number]  cc: Aubriella Perezgarcia H. Posey Pronto, MD, <Dictator> Alric Seton MD ELECTRONICALLY SIGNED 06/25/2013 15:25

## 2014-08-09 NOTE — Consult Note (Signed)
Present Illness patient is a the patient is a 59 year old female who recently underwent knee surgery. She was evaluated preoperatively with a functional study revealing no ischemia. She has a history of tachycardia and had a Holter monitor done prior to the surgical procedure revealing predominant sinus rhythm with episodes of sinus tachycardia. Postoperatively she developed deep vein thromboses followed by a pulmonary embolus. She is now admitted or treatment of the clotting problem. She had been placed on Lovenox subcutaneous prophylactically post procedure. She now is on Xarelto at 50 mg twice daily. She has remained relatively tachycardic with heart rates between 120 160. EKG suggests sinus tachycardia. She is relatively hemodynamically stable but feels fatigue. She denies excessive shortness of breath. Her pulse oximetry reveals adequate oxygenation.   Physical Exam:  GEN well nourished, no acute distress   HEENT PERRL, hearing intact to voice   NECK supple  No masses   RESP normal resp effort  clear BS  no use of accessory muscles   CARD Regular rate and rhythm  Tachycardic  Normal, S1, S2   ABD no hernia  normal BS   LYMPH negative neck, negative axillae   EXTR negative cyanosis/clubbing, positive edema   SKIN normal to palpation   NEURO cranial nerves intact, motor/sensory function intact   PSYCH A+O to time, place, person   Review of Systems:  Subjective/Chief Complaint fatigue and tachycardia   General: Fatigue   Skin: No Complaints   ENT: No Complaints   Eyes: No Complaints   Neck: No Complaints   Respiratory: Short of breath   Cardiovascular: Palpitations  Dyspnea   Gastrointestinal: No Complaints   Genitourinary: No Complaints   Vascular: No Complaints   Musculoskeletal: status post knee surgery   Neurologic: No Complaints   Hematologic: No Complaints   Endocrine: No Complaints   Psychiatric: No Complaints   Review of Systems: All other  systems were reviewed and found to be negative   Medications/Allergies Reviewed Medications/Allergies reviewed   EKG:  EKG NSR   Interpretation sinus tachycardia    Codeine: Unknown  Tape: Other   Impression 59 year old female with history of tachycardia as an outpatient now status post right total knee replacement complicated by DVT and pulmonary embolus. Has to see patient regarding her tachycardia. She remains in a narrow complex tachycardia at approximately 120-130. She did have episodes of heart rate as high as 160. This all appeared to be sinus tachycardia. She had a functional study prior to surgery which revealed no reversible ischemia. She is relatively hemodynamically stable despite the tachycardia. She had been placed on Cardizem 60 q.6 h. She remains tachycardic. She was on metoprolol succinate b.i.d. as an outpatient. We will proceed with an echocardiogram to evaluate right ventricular function and size as well as place from metoprolol tartrate to attempt to control the rate. We'll continue to treat with a Xarelto for anticoagulation chronically however will give milligram per kilogram dose of subcutaneous Lovenox today to assist with anticoagulation. Her pulse oximetry appears stable. She does not appear to have severe pain to drive her tachycardia.   Plan 1. Continue with Cardizem as you're doing 2. Echocardiogram to evaluate LV function and right ventricular size as well as right-sided pressures 3. Will give Lovenox 130 mg subcutaneous times one today 4. Continue a Xarelto 5. Review echo when available and follow heart rate 6. Further recommendations depending on course.   Electronic Signatures: Teodoro Spray (MD)  (Signed 04-Mar-15 13:29)  Authored: General Aspect/Present Illness,  History and Physical Exam, Review of System, EKG , Allergies, Impression/Plan   Last Updated: 04-Mar-15 13:29 by Teodoro Spray (MD)

## 2014-08-09 NOTE — Op Note (Signed)
PATIENT NAME:  Tiffany Wells, Tiffany Wells MR#:  [id number] DATE OF BIRTH:  [birth year]  DATE OF PROCEDURE:  11/15/2013  PREOPERATIVE DIAGNOSES:  1.  History of  PE with  deep venous thrombosis.  2.  Degenerative joint disease requiring total knee replacement.   POSTOPERATIVE DIAGNOSES: 1.  History of PE with deep venous thrombosis. 2.  Degenerative joint disease requiring total knee replacement.  PROCEDURES PERFORMED: 1.  Inferior venacavogram.  2.  Placement of infrarenal inferior vena caval filter, Denali type.   SURGEON: Hortencia Pilar, M.D.   SEDATION: Versed 1.5 mg plus fentanyl 75 mcg plus Zofran 4 mg administered IV. Continuous ECG, pulse oximetry and cardiopulmonary monitoring is performed throughout the entire procedure by the interventional radiology nurse. Total sedation time was 25 minutes.   ACCESS: An 8 French sheath, right common femoral vein.   FLUOROSCOPY TIME: Approximately 1 minute.   CONTRAST USED: Isovue 20 mL.   INDICATIONS: [name] is a 59 year old woman who presented with shortness of breath and cardiac arrhythmia following knee replacement. She was subsequently found to have PE. DVT was also identified. She is now preparing to undergo her second total knee replacement and, therefore, is undergoing placement of a prophylactic IVC filter to prevent lethal pulmonary emboli. She will require being off her anticoagulation in perioperative period. The risks and benefits have been discussed. All questions have been answered. The patient has agreed to proceed.   DESCRIPTION OF PROCEDURE: The patient is taken to special procedures and placed in the supine position. After adequate sedation is achieved, both groins are prepped and draped in a sterile fashion. Ultrasound is placed in a sterile sleeve. Ultrasound is utilized secondary to lack of appropriate landmarks and to avoid vascular injury. Under real-time visualization, 1% lidocaine is infiltrated in the soft tissue and  subsequently the femoral vein is identified. It is echolucent and compressible indicating patency. Images recorded for the permanent record. Micropuncture needle is then inserted under real-time visualization. Microwire followed by micro sheath, Amplatz wire followed by an 8 French delivery sheath with dilator. Delivery sheath and dilator are advanced over the wire and positioned with the markers just above the iliac confluence. Bolus injection of contrast then used to demonstrate the inferior vena cava. Renal blushes are located just above the L2 level, and the cava was measured just below this and found to be 22 mm in diameter.   The wire is then reintroduced.  The sheath is advanced. The dilator and wire removed, and the filter is then passed through the sheath and deployed at the L2 level without difficulty. Filter is oriented in excellent position.   The sheath is pulled, pressure is held. There are no immediate complications.   INTERPRETATION: Inferior vena cava was opacified with a bolus injection of contrast. There are no filling defects.  Cava is normal anatomically and the renal blushes are identified at the L1 level. Cava measures 22 mm in diameter, acceptable for Denali filter and, therefore, the filter is deployed with excellent orientation at the L2 level.   SUMMARY: Successful placement of infrarenal inferior vena caval filter, Denali type.    ____________________________ Katha Cabal, MD ggs:ts D: 11/15/2013 14:53:38 ET T: 11/15/2013 15:48:25 ET JOB#: [id number]  cc: Katha Cabal, MD, <Dictator> Deborra Medina, MD Lucas Mallow, MD Katha Cabal MD ELECTRONICALLY SIGNED 11/26/2013 17:16

## 2014-08-09 NOTE — Consult Note (Signed)
Brief Consult Note: Diagnosis: Ischemic colitis left descending colon.   Patient was seen by consultant.   Discussed with Attending MD.   Comments: I have personally reviewed the CT of the abdomen from September, 2015.  It is an excellent quality scan.  It shows the mesenteric circulation is free of obstruction at the macrovascular level.  SMA and IMA are well visualized to the 4th order branches.  No interventin or invsasive procedures at this time.  Continue medical /supportive care at this time  Consideration for a follow up CT scan to assess the entire colon as the colonoscopy was appropriately terminated at about the left flexure.  Electronic Signatures: Hortencia Pilar (MD)  (Signed 30-Oct-15 07:57)  Authored: Brief Consult Note   Last Updated: 30-Oct-15 07:57 by Hortencia Pilar (MD)

## 2014-08-09 NOTE — Op Note (Signed)
PATIENT NAME:  Tiffany Wells, Tiffany Wells MR#:  [id number] DATE OF BIRTH:  [birth year]  DATE OF PROCEDURE:  01/10/2014  PREOPERATIVE DIAGNOSIS:  1. Deep venous thrombosis associated with pulmonary embolism.  2. Degenerative joint disease, status post bilateral knee replacements.   POSTOPERATIVE DIAGNOSIS: 1. Deep venous thrombosis associated with pulmonary embolism.  2. Degenerative joint disease, status post bilateral knee replacements.   PROCEDURE PERFORMED:  1. Inferior venacavogram.  2. Removal of inferior vena caval filter.   PROCEDURE PERFORMED BY: Katha Cabal, M.D.   SEDATION: Is 10 mg of Versed p.o. plus 6 mg Versed IV  plus 225 mcg of fentanyl IV.  Continuous ECG, pulse oximetry and cardiopulmonary monitoring is performed throughout the entire procedure by the interventional radiology nurse. Total sedation time was 40 minutes.   ACCESS: A 9 French sheath, right IJ.   CONTRAST USED: Isovue 15 mL.   FLUOROSCOPY TIME: 2.4 minutes.   INDICATIONS: Tiffany Wells is a 59 year old woman who sustained DVT with pulmonary embolism following her first knee replacement. In preparation for the second knee replacement, she has undergone filter placement. She has done well from her second operation and is now undergoing retrieval. Risks and benefits are reviewed. All questions are answered. The patient agrees to proceed.   DESCRIPTION OF PROCEDURE: The patient is taken to special procedures and after adequate sedation is achieved, her right neck is prepped and draped in a sterile fashion. Ultrasound is placed in a sterile sleeve. Jugular vein is identified. It is echolucent and compressible indicating patency. Image is recorded for the permanent record. Under real-time visualization, the jugular vein is accessed with a micropuncture needle, microwire followed by micro sheath, J-wire is then advanced under fluoroscopic guidance with the help of a Kumpe catheter and negotiated into the inferior vena  cava. Kumpe is removed and the retrieval sheath is advanced over the wire. It is advanced down to the confluence of the iliac veins and inferior venacavogram was performed. After review of the images, the sheath is repositioned to above the filter. The snare is introduced and with 2 attempts the hook is snared. The filter is then collapsed and removed without difficulty. The sheath is pulled, pressure held, and there are no immediate complications.   INTERPRETATION: Images of the cava demonstrate that it is free of thrombus. The filter is in an upright and good orientation. There is no thrombus within the filter. There are no abnormalities of the iliac confluence or inferior vena cava.   SUMMARY: Successful retrieval of inferior vena cava filter.   ____________________________ Katha Cabal, MD ggs:JT D: 01/10/2014 20:06:35 ET T: 01/11/2014 02:09:03 ET JOB#: [id number]  cc: Katha Cabal, MD, <Dictator> Lucas Mallow, MD Deborra Medina, MD  Katha Cabal MD ELECTRONICALLY SIGNED 02/03/2014 20:44

## 2014-08-09 NOTE — Op Note (Signed)
PATIENT NAME:  Tiffany Wells, Tiffany Wells MR#:  [id number] DATE OF BIRTH:  Jan 30, 1956  DATE OF PROCEDURE:  11/19/2013  PREOPERATIVE DIAGNOSIS: Severe degenerative arthritis, left knee.   POSTOPERATIVE DIAGNOSIS: Severe degenerative arthritis, left knee.   PROCEDURE: Left total knee arthroplasty, DePuy LCS.    SURGEON: Lucas Mallow, M.D.   ASSISTANT: Thornton Park, M.D.   ANESTHESIA: Spinal.   COMPLICATIONS: None.   TOURNIQUET TIME: 1 hour and 13 minutes.   DRAINS: 2 Autovac drains.   DESCRIPTION OF PROCEDURE: 2 grams of Ancef was given intravenously prior to the procedure. Spinal anesthesia is induced. Foley catheter is inserted. The tourniquet is applied to the left upper extremity and the left upper extremity is thoroughly prepped with alcohol and ChloraPrep, and draped in standard sterile fashion. The extremity is wrapped out with the Esmarch bandage and pneumatic tourniquet elevated to 350 mmHg. A standard anterior longitudinal incision is made and the dissection carried down to the medial and lateral retinaculum of the patella. A standard medial parapatellar incision is made and the knee is flexed, and the patella is reflected laterally. Standard synovial and hypertrophic spur debridement is performed. Mild medial release is performed. The proximal tibial plateau is revealed and the proximal tibial cutter is put into place and aligned properly. Proximal tibial cut is then made and is seen to be satisfactory. Distal femur is sized as a large. The distal femoral block is put into place and the central drill hole made. The femoral rotation guide is then put into place and is seen to be satisfactory at 10 mm. Cutting block is pinned and the anterior and posterior cuts are made and are seen to be satisfactory. The flexion gap is measured with the 10 mm block and is seen to be satisfactory with good alignment. The 5 degree valgus distal femoral cutting block is then impacted into place and  pinned, and the appropriate cuts made.   The wound is thoroughly irrigated multiple times throughout the case with the pulsatile lavage and posterior debridement is performed. The extension block is put into place and is seen to be in satisfactory alignment. The femoral finishing guide is put into place and the appropriate cuts and drill holes made. The proximal tibia is then revealed and the #3 tray is chosen, and the central hole made. All trial components are then inserted and seen to be in satisfactory alignment with good stability. The patella is prepared in the usual manner for a porous-coated patella. All trial components are then removed. The joint is thoroughly irrigated multiple times with the pulsatile lavage. A #3 metal tibial tray is then cemented into place with a 10 mm rotating platform LCS polyethylene component. A large porous-coated femur is then impacted into place and the knee extended, and is seen to be a good fit with excellent alignment. Porous-coated patella is impacted into place. Range of motion is good. The wound is thoroughly irrigated multiple times again with the pulsatile lavage.   Two Autovac drains are brought out through separate stab wound incisions. The medial retinaculum is repaired with #2 Tycron. The subcutaneous tissue is closed with 2-0 Vicryl and the skin is closed with the skin stapler. A soft bulky dressing is applied. The tourniquet is released. Polar Care and knee immobilizer applied. The patient is returned to the recovery room in satisfactory condition having tolerated the procedure quite well.   ____________________________ Lucas Mallow, MD ces:jr D: 11/19/2013 12:27:00 ET T: 11/19/2013 13:06:25 ET JOB#: [id number]  cc:  Lucas Mallow, MD, <Dictator> Lucas Mallow MD ELECTRONICALLY SIGNED 11/20/2013 13:06

## 2014-08-09 NOTE — Consult Note (Signed)
The pharmacy ordered a creatinine so I added some more labs so would not have to be stuck again tomorrow.  Electronic Signatures: Manya Silvas (MD)  (Signed on 29-Oct-15 17:40)  Authored  Last Updated: 29-Oct-15 17:40 by Manya Silvas (MD)

## 2014-08-09 NOTE — Discharge Summary (Signed)
PATIENT NAME:  Tiffany Wells, Tiffany Wells MR#:  [id number] DATE OF BIRTH:  Oct 27, 1955  DATE OF ADMISSION:  02/13/2014 DATE OF DISCHARGE:  02/16/2014  DISCHARGE DIAGNOSES:  1.  Ischemic colitis. 2.  History of deep venous thrombosis and pulmonary embolism.  3.  History of supraventricular tachycardia.  4.  Inflammatory colitis of splenic flexure.  DISCHARGE MEDICATIONS:  1.  Metoprolol succinate 25 mg p.o. b.i.d. 2.  Prilosec 20 mg p.o. daily. 3.  Benadryl 25 mg 2 tablets once a day. 3.  Cardizem CD 240 mg p.o. daily. 4.  Flexeril 10 mg p.o. as needed for muscle spasms.  5.  Xarelto 20 mg in the evening.  6.  Levaquin 500 mg p.o. daily for 5 days. 7.  Flagyl 500 mg every 8 hours for 10 days.   CONSULTATIONS: GI consult with Manya Silvas, MD; vascular consult with Katha Cabal, MD.  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 She came because of abdominal pain and bloody stools. The patient has been having abdominal cramps and some blood in the stools on and off since August and the patient had a sigmoidoscopy done with Dr. Percell [place] office on 29th. It showed ischemic colitis and diverticulosis. The patient had a colonoscopy also on the 29th after sigmoidoscopy. Her colonoscopy showed severe patchy inflammation of sigmoid colon and she was admitted for ischemic colitis with abdominal pain and bloody stools. She was seen by Dr. Delana Meyer as well in consultation for possible angiogram, but Dr. Delana Meyer recommended a CAT scan to look at the whole colon. The patient was kept on IV fluids and n.p.o. and IV pain medications. She also was given Cipro and Flagyl. The patient's abdominal CAT scan showed inflammatory colitis of splenic flexure. She improved nicely with IV Cipro and Flagyl and IV fluids, so we started her on liquid diet. The patient tolerated full liquids, did not have any further abdominal pain or blood in the stools. Dr. Vira Agar felt she could be discharged yesterday and we  discharged her and Ms. Nieves is advised to continue full liquid diet for another 1-2 days and then start low residual diet after that and see him in the office and continue antibiotics.   PHYSICAL EXAMINATION ON DAY OF DISCHARGE:  CARDIOVASCULAR: S1, S2 regular.  LUNGS: Clear to auscultation.  ABDOMEN: Soft, nontender, nondistended. Bowel sounds present.  DISCHARGE VITAL SIGNS: Temperature 98.2, heart rate 80, blood pressure 124/78, saturation is 99% on room air.   LABORATORY DATA: Electrolytes: Sodium 145, potassium 3.5, chloride 108, bicarbonate 29, BUN 7, creatinine 0.79, glucose 102. WBC 6.8, hemoglobin 12.3, hematocrit 37.8, platelets 177,000. CT abdomen showed mild inflammatory colitis of splenic flexure.   TIME SPENT: More than 30 minutes.    ____________________________ Epifanio Lesches, MD sk:TT D: 02/17/2014 13:17:00 ET T: 02/17/2014 22:05:33 ET JOB#: [id number]  cc: Epifanio Lesches, MD, <Dictator> Epifanio Lesches MD ELECTRONICALLY SIGNED 03/09/2014 23:26

## 2014-08-09 NOTE — Consult Note (Signed)
Pt has had severe cramping abd pain with diarrhea and then rectal bleeding.  Colonoscopy attempt showed severe colitis in proximal sigmoid area which looked very much  like ischemic colitis and biopsies were taken.  Other areas of patchy inflammation were seen in more distal sigmoid, none in rectum.  Discussed with her husband Dr. Bary Castilla and recommend admission to hospital and iv fluids, iv antibiotics, consult with vascular surgery.  Recommend CT scan with contrast and probably need a PIC line.  Would continue Xarelto for now, small risk of bleeding from this. Will follow.  Electronic Signatures: Manya Silvas (MD)  (Signed on 29-Oct-15 16:54)  Authored  Last Updated: 29-Oct-15 16:54 by Manya Silvas (MD)

## 2014-08-09 NOTE — Discharge Summary (Signed)
PATIENT NAME:  Tiffany Wells, Tiffany Wells MR#:  [id number] DATE OF BIRTH:  [birth year]  DATE OF ADMISSION:  11/19/2013 DATE OF DISCHARGE:  11/24/2013  DISCHARGE DIAGNOSES:  1.  Severe degenerative arthritis, left hip.  2.  History of deep vein thrombosis and pulmonary embolism.  3.  Osteoarthritis.  4.  Hypertension.  5.  Supraventricular tachycardia.   PROCEDURE: Left total knee replacement on 11/19/2013.   HISTORY AND PHYSICAL EXAMINATION: As written on admission.   LABORATORY DATA: As noted in the chart.   COURSE IN THE HOSPITAL: One week previously the patient had a vena cava filter for prophylaxis for pulmonary embolism inserted by Dr. Delana Meyer of the vascular service. The patient has been on bridging Lovenox since that time. Following full laboratory evaluation and cardiology consent, the patient was taken to the Operating Room on 11/19/2013, where left total knee arthroplasty was performed without difficulty. The patient tolerated the procedure quite well. Postoperative x-ray demonstrated satisfactory alignment and fixation. On postoperative day #1 the patient's Autovac was pulled and dressing was changed. Physical therapy was begun with ambulation, full weightbearing using the walker and range of motion was begun. The patient developed significant postoperative anemia and orthostatic changes on approximately postoperative day #3. Cardiology consult was obtained and it was felt that the anemia was solely responsible for the orthostasis. The patient was transfused 2 units of packed cells which elevated her hemoglobin 1 point to 8.9. The patient symptomatically was better and physical therapy was continued. By 11/24/2013 it was felt that the patient could be safely discharged on her preoperative medications including Xarelto 20 mg a day as prophylaxis for venous thrombosis and pulmonary embolism. Home health physical therapy was ordered. She may remove the bandage as necessary and bathe and reapply a new  bandage. She may be full weightbearing in physical therapy. She is to return to the office in approximately 10 days for staple removal and physical examination at Dr. Thompson Caul office.    ____________________________ Lucas Mallow, MD ces:TT D: 12/11/2013 16:43:17 ET T: 12/11/2013 16:58:44 ET JOB#: [id number]  cc: Lucas Mallow, MD, <Dictator> Lucas Mallow MD ELECTRONICALLY SIGNED 12/20/2013 6:39

## 2014-08-09 NOTE — Consult Note (Signed)
Pt feeling much better than yesterday, not passing any further blood, abd pain better, not tender to palpation.  CT results noted, discussed case with Dr. Delana Meyer.  Will see tomorrow, keep on liquids for another day.  Electronic Signatures: Manya Silvas (MD)  (Signed on 30-Oct-15 19:28)  Authored  Last Updated: 30-Oct-15 19:28 by Manya Silvas (MD)

## 2014-08-11 LAB — SURGICAL PATHOLOGY

## 2014-08-29 ENCOUNTER — Other Ambulatory Visit: Payer: Self-pay | Admitting: Internal Medicine

## 2014-09-12 ENCOUNTER — Other Ambulatory Visit: Payer: Self-pay | Admitting: Internal Medicine

## 2014-09-12 ENCOUNTER — Ambulatory Visit: Payer: 59 | Admitting: Internal Medicine

## 2014-09-12 MED ORDER — PREDNISONE 10 MG PO TABS: ORAL_TABLET | ORAL | Status: DC

## 2014-09-12 MED ORDER — LEVOFLOXACIN 500 MG PO TABS: 500.0000 mg | ORAL_TABLET | Freq: Every day | ORAL | Status: DC

## 2015-01-29 ENCOUNTER — Other Ambulatory Visit: Payer: Self-pay | Admitting: Internal Medicine

## 2015-01-29 NOTE — Telephone Encounter (Signed)
90 day supply authorized and sent   

## 2015-01-29 NOTE — Telephone Encounter (Signed)
Last OV 11.30.15, no OV scheduled. Please advise refill

## 2015-03-02 ENCOUNTER — Other Ambulatory Visit: Payer: Self-pay | Admitting: Internal Medicine

## 2015-03-02 NOTE — Telephone Encounter (Signed)
Last OV was 02/20/14, Please advise?

## 2015-03-03 NOTE — Telephone Encounter (Signed)
Ok to refill,  Refill sent .  Dr Dwyane Luo wife,  I am in close contact with her .

## 2015-04-21 ENCOUNTER — Other Ambulatory Visit: Payer: Self-pay | Admitting: Internal Medicine

## 2015-04-21 ENCOUNTER — Other Ambulatory Visit: Payer: Self-pay | Admitting: *Deleted

## 2015-04-21 DIAGNOSIS — R350 Frequency of micturition: Secondary | ICD-10-CM | POA: Diagnosis not present

## 2015-04-21 DIAGNOSIS — N390 Urinary tract infection, site not specified: Secondary | ICD-10-CM

## 2015-04-21 MED ORDER — NITROFURANTOIN MONOHYD MACRO 100 MG PO CAPS: 100.0000 mg | ORAL_CAPSULE | Freq: Two times a day (BID) | ORAL | Status: DC

## 2015-05-01 ENCOUNTER — Telehealth: Payer: 59 | Admitting: Family

## 2015-05-01 ENCOUNTER — Other Ambulatory Visit: Payer: Self-pay | Admitting: General Surgery

## 2015-05-01 ENCOUNTER — Telehealth: Payer: Self-pay | Admitting: Internal Medicine

## 2015-05-01 DIAGNOSIS — N390 Urinary tract infection, site not specified: Secondary | ICD-10-CM

## 2015-05-01 DIAGNOSIS — Z8679 Personal history of other diseases of the circulatory system: Secondary | ICD-10-CM | POA: Diagnosis not present

## 2015-05-01 MED ORDER — HYOSCYAMINE SULFATE 0.125 MG PO TBDP: 0.1250 mg | ORAL_TABLET | ORAL | Status: DC | PRN

## 2015-05-01 MED ORDER — NITROFURANTOIN MONOHYD MACRO 100 MG PO CAPS: 100.0000 mg | ORAL_CAPSULE | Freq: Two times a day (BID) | ORAL | Status: DC

## 2015-05-01 MED ORDER — PHENAZOPYRIDINE HCL 200 MG PO TABS: 200.0000 mg | ORAL_TABLET | Freq: Three times a day (TID) | ORAL | Status: DC | PRN

## 2015-05-01 NOTE — Progress Notes (Signed)
We are sorry that you are not feeling well.  Here is how we plan to help!  Based on what you shared with me it looks like you most likely have a simple urinary tract infection.  A UTI (Urinary Tract Infection) is a bacterial infection of the bladder.  Most cases of urinary tract infections are simple to treat but a key part of your care is to encourage you to drink plenty of fluids and watch your symptoms carefully.  I have prescribed MacroBid 100 mg twice a day for 7 more days. With recurrent UTI, you can do 14 days with this and it is the only one that does a good job at treating resistant E. Coli strains. Let's try another 7 days of this before considering another antibiotic type.  Your symptoms should gradually improve. Call us if the burning in your urine worsens, you develop worsening fever, back pain or pelvic pain or if your symptoms do not resolve after completing the antibiotic.   Urinary tract infections can be prevented by drinking plenty of water to keep your body hydrated.  Also be sure when you wipe, wipe from front to back and don't hold it in!  If possible, empty your bladder every 4 hours.  Your e-visit answers were reviewed by a board certified advanced clinical practitioner to complete your personal care plan.  Depending on the condition, your plan could have included both over the counter or prescription medications.  If there is a problem please reply  once you have received a response from your provider.  Your safety is important to [place].  If you have drug allergies check your prescription carefully.    You can use MyChart to ask questions about today's visit, request a non-urgent call back, or ask for a work or school excuse for 24 hours related to this e-Visit. If it has been greater than 24 hours you will need to follow up with your provider, or enter a new e-Visit to address those concerns.   You will get an e-mail in the next two days asking about your experience.  I  hope that your e-visit has been valuable and will speed your recovery. Thank you for using e-visits.

## 2015-05-01 NOTE — Addendum Note (Signed)
Addended by: Chevis Pretty on: 05/01/2015 05:03 PM   Modules accepted: Orders

## 2015-05-03 LAB — URINE CULTURE

## 2015-05-04 ENCOUNTER — Other Ambulatory Visit: Payer: Self-pay | Admitting: Internal Medicine

## 2015-05-04 ENCOUNTER — Telehealth: Payer: Self-pay | Admitting: Internal Medicine

## 2015-05-04 ENCOUNTER — Encounter: Payer: Self-pay | Admitting: General Surgery

## 2015-05-04 DIAGNOSIS — N3090 Cystitis, unspecified without hematuria: Secondary | ICD-10-CM

## 2015-05-04 MED ORDER — FOSFOMYCIN TROMETHAMINE 3 G PO PACK: 3.0000 g | PACK | Freq: Once | ORAL | Status: DC

## 2015-05-04 NOTE — Telephone Encounter (Signed)
Patient states that her symptoms went away for about two days and said they have come back. She was wondering if there was anything that could be called in that would be better. i put her on you schedule for Wednesday just in case you wanted to see her.

## 2015-05-04 NOTE — Telephone Encounter (Signed)
Find out what she took,  And please get the urine culture

## 2015-05-04 NOTE — Assessment & Plan Note (Addendum)
enterococcus jan 3rd,  s to macrobid . Recurrent symptoms several days after stopping  repeat culture jan 13 same organism .  Started macrobid again ,  symptoms recurred while still on macrobid,  Advised to resend urine ,  trial of fosfomycin

## 2015-05-05 NOTE — Telephone Encounter (Signed)
The labs were given to [facility] and placed in your lavender folder. This had the medication highlighted that she was taking.

## 2015-05-06 ENCOUNTER — Ambulatory Visit: Payer: 59 | Admitting: Internal Medicine

## 2015-05-18 ENCOUNTER — Other Ambulatory Visit: Payer: Self-pay | Admitting: General Surgery

## 2015-05-18 ENCOUNTER — Other Ambulatory Visit: Payer: Self-pay | Admitting: *Deleted

## 2015-05-18 DIAGNOSIS — N390 Urinary tract infection, site not specified: Secondary | ICD-10-CM

## 2015-05-20 LAB — URINE CULTURE

## 2015-05-22 ENCOUNTER — Encounter: Payer: Self-pay | Admitting: Internal Medicine

## 2015-06-02 ENCOUNTER — Other Ambulatory Visit: Payer: Self-pay | Admitting: Internal Medicine

## 2015-06-02 MED ORDER — FLUCONAZOLE 150 MG PO TABS: 150.0000 mg | ORAL_TABLET | Freq: Every day | ORAL | Status: DC

## 2015-06-02 NOTE — Telephone Encounter (Signed)
Refill sent.

## 2015-06-02 NOTE — Telephone Encounter (Signed)
Filled in January. Please advise?

## 2015-06-05 DIAGNOSIS — N3000 Acute cystitis without hematuria: Secondary | ICD-10-CM | POA: Diagnosis not present

## 2015-06-05 DIAGNOSIS — Z0000 Encounter for general adult medical examination without abnormal findings: Secondary | ICD-10-CM | POA: Diagnosis not present

## 2015-06-05 DIAGNOSIS — N952 Postmenopausal atrophic vaginitis: Secondary | ICD-10-CM | POA: Diagnosis not present

## 2015-06-15 ENCOUNTER — Encounter: Payer: Self-pay | Admitting: Internal Medicine

## 2015-06-16 MED ORDER — HYDROCOD POLST-CPM POLST ER 10-8 MG/5ML PO SUER: 5.0000 mL | Freq: Every evening | ORAL | Status: DC | PRN

## 2015-06-16 NOTE — Telephone Encounter (Signed)
Patient 's tussionex rx needs to be available for pickup this afternoon.  Dr Dwyane Luo wife so let's not lose it

## 2015-06-16 NOTE — Telephone Encounter (Signed)
Script placed at front desk for pick up

## 2015-07-02 ENCOUNTER — Encounter: Payer: Self-pay | Admitting: Internal Medicine

## 2015-07-03 NOTE — Telephone Encounter (Signed)
Patient requesting Tessalon pearls ok to send?

## 2015-07-05 ENCOUNTER — Other Ambulatory Visit: Payer: Self-pay | Admitting: Internal Medicine

## 2015-07-05 MED ORDER — PREDNISONE 10 MG PO TABS: ORAL_TABLET | ORAL | Status: DC

## 2015-07-05 MED ORDER — BENZONATATE 200 MG PO CAPS: 200.0000 mg | ORAL_CAPSULE | Freq: Three times a day (TID) | ORAL | Status: DC | PRN

## 2015-07-09 DIAGNOSIS — Z1379 Encounter for other screening for genetic and chromosomal anomalies: Secondary | ICD-10-CM | POA: Diagnosis not present

## 2015-07-09 DIAGNOSIS — Z8041 Family history of malignant neoplasm of ovary: Secondary | ICD-10-CM | POA: Diagnosis not present

## 2015-07-09 DIAGNOSIS — R87610 Atypical squamous cells of undetermined significance on cytologic smear of cervix (ASC-US): Secondary | ICD-10-CM | POA: Diagnosis not present

## 2015-07-09 DIAGNOSIS — Z803 Family history of malignant neoplasm of breast: Secondary | ICD-10-CM | POA: Diagnosis not present

## 2015-07-09 DIAGNOSIS — Z808 Family history of malignant neoplasm of other organs or systems: Secondary | ICD-10-CM | POA: Diagnosis not present

## 2015-07-09 DIAGNOSIS — Z8042 Family history of malignant neoplasm of prostate: Secondary | ICD-10-CM | POA: Diagnosis not present

## 2015-07-09 DIAGNOSIS — Z800 Family history of malignant neoplasm of digestive organs: Secondary | ICD-10-CM | POA: Diagnosis not present

## 2015-07-09 DIAGNOSIS — Z01419 Encounter for gynecological examination (general) (routine) without abnormal findings: Secondary | ICD-10-CM | POA: Diagnosis not present

## 2015-07-09 DIAGNOSIS — Z315 Encounter for genetic counseling: Secondary | ICD-10-CM | POA: Diagnosis not present

## 2015-08-03 ENCOUNTER — Other Ambulatory Visit: Payer: Self-pay | Admitting: Internal Medicine

## 2015-08-03 NOTE — Telephone Encounter (Signed)
Last OV 02/20/14 ok to refill flexeril?

## 2015-08-03 NOTE — Telephone Encounter (Signed)
refilled 

## 2015-08-17 IMAGING — CR DG KNEE 1-2V*L*
1 series · 2 of 2 positions shown · non-contrast
Comparison: None.

CLINICAL DATA: Postop left knee arthroplasty.

EXAM:
LEFT KNEE - 1-2 VIEW

[Series 1: ap · 0.17mm/px · 2 of 2 slices shown]
[im 1/2]
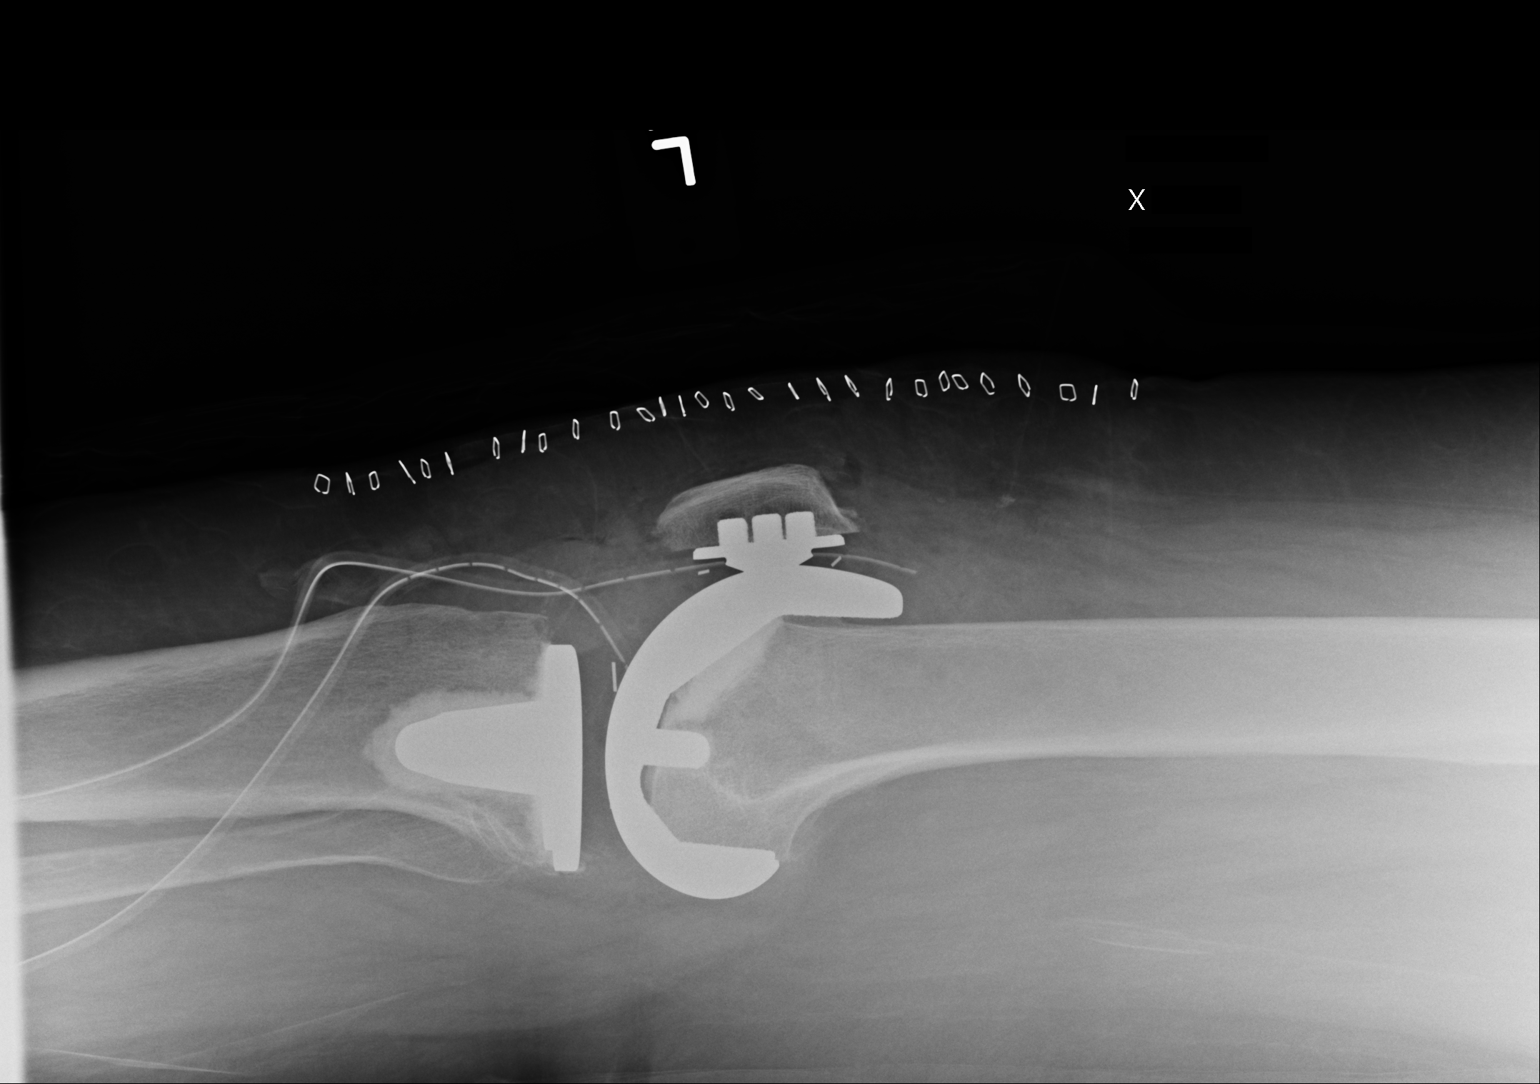
[im 2/2]
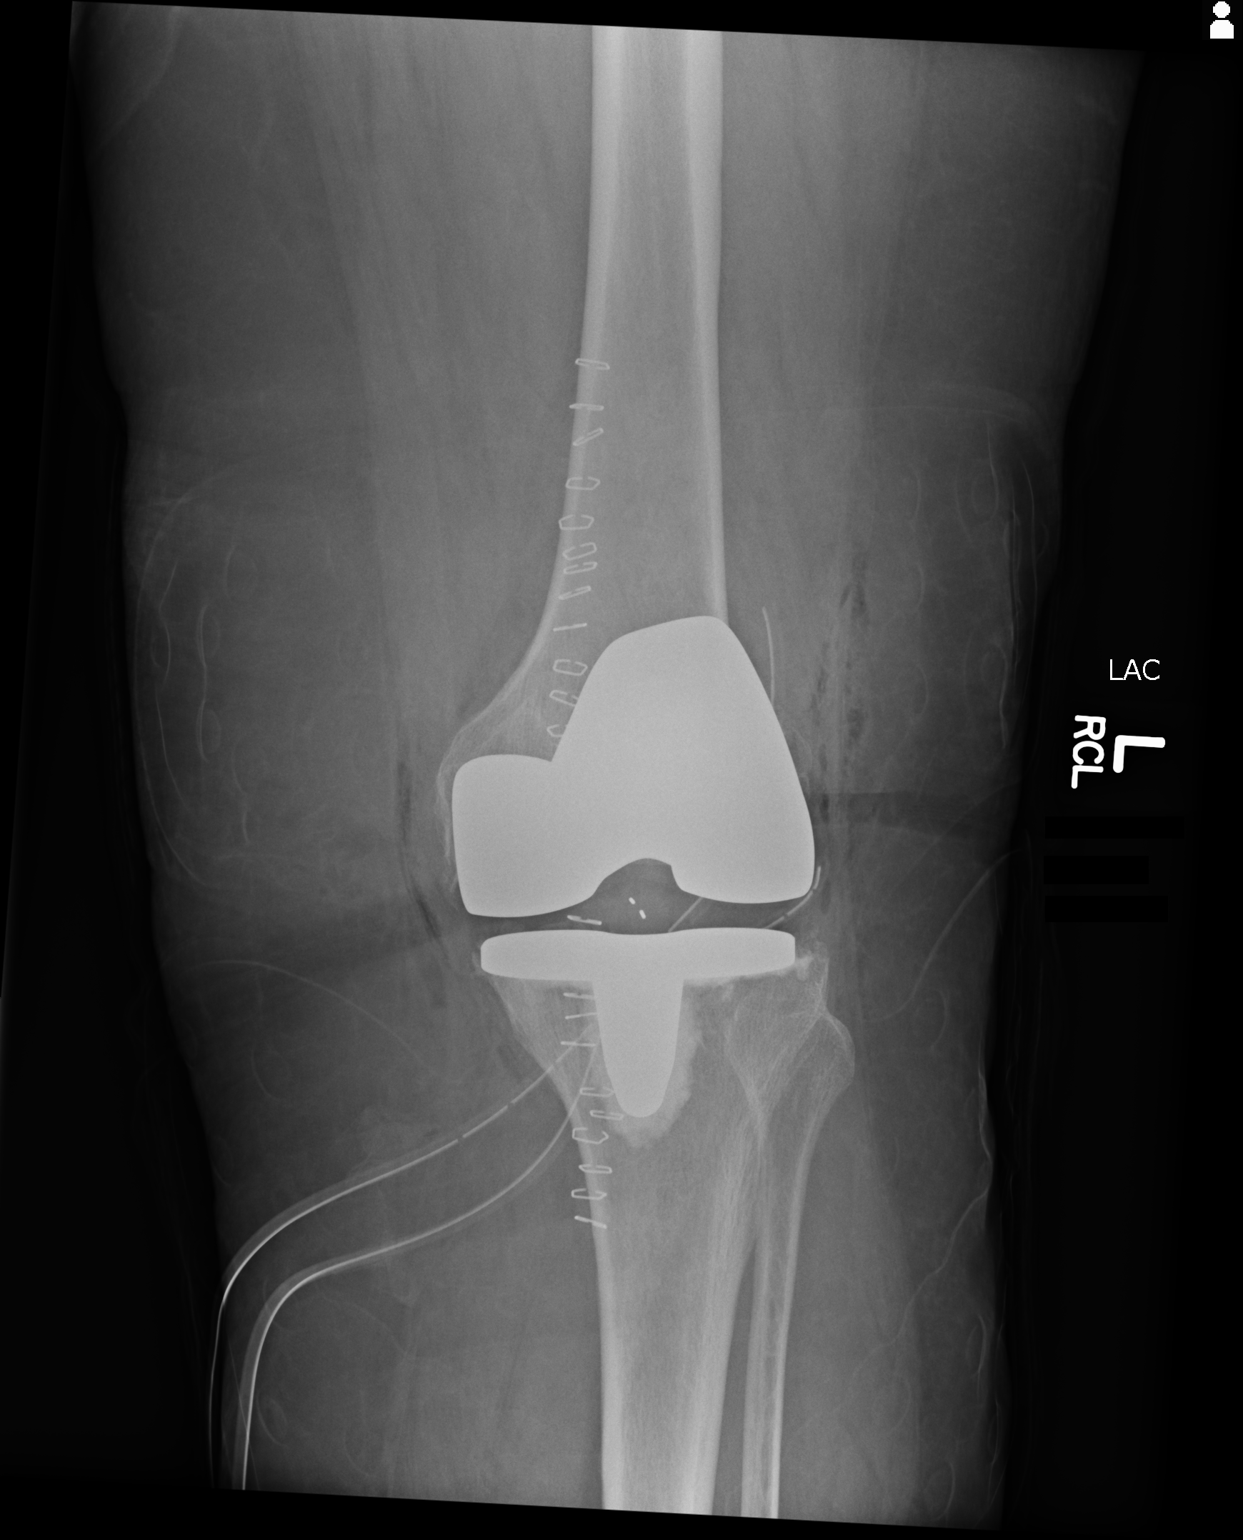

[2 of 2 positions shown; findings below may reference images not displayed]

FINDINGS: New left knee prosthetic components are well-seated and aligned.
There is no acute fracture or evidence of an operative complication.
IMPRESSION: Well-aligned left knee prosthesis.

## 2015-08-28 ENCOUNTER — Other Ambulatory Visit: Payer: Self-pay | Admitting: Internal Medicine

## 2015-09-10 DIAGNOSIS — H524 Presbyopia: Secondary | ICD-10-CM | POA: Diagnosis not present

## 2015-09-18 ENCOUNTER — Other Ambulatory Visit: Payer: Self-pay | Admitting: Internal Medicine

## 2015-09-18 ENCOUNTER — Ambulatory Visit
Admission: RE | Admit: 2015-09-18 | Discharge: 2015-09-18 | Disposition: A | Discharge status: Home / Self Care | Payer: 59 | Source: Ambulatory Visit | Attending: Internal Medicine | Admitting: Internal Medicine

## 2015-09-18 ENCOUNTER — Encounter: Payer: Self-pay | Admitting: Internal Medicine

## 2015-09-18 DIAGNOSIS — R0789 Other chest pain: Secondary | ICD-10-CM

## 2015-09-18 DIAGNOSIS — R079 Chest pain, unspecified: Secondary | ICD-10-CM | POA: Diagnosis not present

## 2015-09-18 DIAGNOSIS — W19XXXA Unspecified fall, initial encounter: Secondary | ICD-10-CM | POA: Insufficient documentation

## 2015-09-18 DIAGNOSIS — S299XXA Unspecified injury of thorax, initial encounter: Secondary | ICD-10-CM | POA: Diagnosis not present

## 2015-09-18 DIAGNOSIS — S2232XA Fracture of one rib, left side, initial encounter for closed fracture: Secondary | ICD-10-CM | POA: Insufficient documentation

## 2015-09-21 ENCOUNTER — Other Ambulatory Visit: Payer: Self-pay | Admitting: Internal Medicine

## 2015-09-21 MED ORDER — TRAMADOL HCL 50 MG PO TABS: 50.0000 mg | ORAL_TABLET | Freq: Three times a day (TID) | ORAL | Status: DC | PRN

## 2015-09-21 NOTE — Progress Notes (Signed)
Script faxed as requested.

## 2015-10-06 ENCOUNTER — Other Ambulatory Visit: Payer: Self-pay | Admitting: Internal Medicine

## 2015-10-06 DIAGNOSIS — M79673 Pain in unspecified foot: Secondary | ICD-10-CM | POA: Insufficient documentation

## 2015-10-06 DIAGNOSIS — M79671 Pain in right foot: Secondary | ICD-10-CM | POA: Diagnosis not present

## 2015-10-06 NOTE — Telephone Encounter (Signed)
Ok to fill no OV since 2015.

## 2015-10-07 ENCOUNTER — Other Ambulatory Visit: Payer: Self-pay | Admitting: Internal Medicine

## 2015-10-07 NOTE — Telephone Encounter (Signed)
Filled.  No appt needed unless she wants one  (Dr Dwyane Luo wife)

## 2015-10-12 ENCOUNTER — Encounter: Payer: Self-pay | Admitting: Internal Medicine

## 2015-10-12 ENCOUNTER — Ambulatory Visit (INDEPENDENT_AMBULATORY_CARE_PROVIDER_SITE_OTHER): Payer: 59 | Admitting: Internal Medicine

## 2015-10-12 VITALS — BP 104/72 | HR 91 | Temp 98.5°F | Resp 12 | Ht 68.0 in | Wt 295.0 lb

## 2015-10-12 DIAGNOSIS — Z8781 Personal history of (healed) traumatic fracture: Secondary | ICD-10-CM

## 2015-10-12 DIAGNOSIS — R7303 Prediabetes: Secondary | ICD-10-CM | POA: Diagnosis not present

## 2015-10-12 DIAGNOSIS — Z862 Personal history of diseases of the blood and blood-forming organs and certain disorders involving the immune mechanism: Secondary | ICD-10-CM | POA: Diagnosis not present

## 2015-10-12 DIAGNOSIS — E669 Obesity, unspecified: Secondary | ICD-10-CM

## 2015-10-12 DIAGNOSIS — I471 Supraventricular tachycardia: Secondary | ICD-10-CM

## 2015-10-12 DIAGNOSIS — I10 Essential (primary) hypertension: Secondary | ICD-10-CM

## 2015-10-12 DIAGNOSIS — E785 Hyperlipidemia, unspecified: Secondary | ICD-10-CM

## 2015-10-12 DIAGNOSIS — D62 Acute posthemorrhagic anemia: Secondary | ICD-10-CM

## 2015-10-12 LAB — CBC WITH DIFFERENTIAL/PLATELET
Basophils Absolute: 0.1 10*3/uL (ref 0.0–0.1)
Basophils Relative: 0.4 % (ref 0.0–3.0)
Eosinophils Absolute: 0.1 10*3/uL (ref 0.0–0.7)
Eosinophils Relative: 0.9 % (ref 0.0–5.0)
HCT: 40.8 % (ref 36.0–46.0)
Hemoglobin: 13.5 g/dL (ref 12.0–15.0)
Lymphocytes Relative: 21.4 % (ref 12.0–46.0)
Lymphs Abs: 3 10*3/uL (ref 0.7–4.0)
MCHC: 33.1 g/dL (ref 30.0–36.0)
MCV: 81.1 fl (ref 78.0–100.0)
Monocytes Absolute: 0.7 10*3/uL (ref 0.1–1.0)
Monocytes Relative: 5.2 % (ref 3.0–12.0)
Neutro Abs: 10.3 10*3/uL — ABNORMAL HIGH (ref 1.4–7.7)
Neutrophils Relative %: 72.1 % (ref 43.0–77.0)
Platelets: 240 10*3/uL (ref 150.0–400.0)
RBC: 5.03 Mil/uL (ref 3.87–5.11)
RDW: 14.8 % (ref 11.5–15.5)
WBC: 14.3 10*3/uL — ABNORMAL HIGH (ref 4.0–10.5)

## 2015-10-12 LAB — TSH: TSH: 3.66 u[IU]/mL (ref 0.35–4.50)

## 2015-10-12 LAB — COMPREHENSIVE METABOLIC PANEL
ALT: 14 U/L (ref 0–35)
AST: 13 U/L (ref 0–37)
Albumin: 3.6 g/dL (ref 3.5–5.2)
Alkaline Phosphatase: 83 U/L (ref 39–117)
BUN: 13 mg/dL (ref 6–23)
CO2: 32 mEq/L (ref 19–32)
Calcium: 8.9 mg/dL (ref 8.4–10.5)
Chloride: 103 mEq/L (ref 96–112)
Creatinine, Ser: 0.83 mg/dL (ref 0.40–1.20)
GFR: 74.04 mL/min (ref >60.00)
Glucose, Bld: 93 mg/dL (ref 70–99)
Potassium: 3.9 mEq/L (ref 3.5–5.1)
Sodium: 141 mEq/L (ref 135–145)
Total Bilirubin: 0.3 mg/dL (ref 0.2–1.2)
Total Protein: 6.2 g/dL (ref 6.0–8.3)

## 2015-10-12 LAB — MAGNESIUM: Magnesium: 2 mg/dL (ref 1.5–2.5)

## 2015-10-12 LAB — HEMOGLOBIN A1C: Hgb A1c MFr Bld: 5.9 % (ref 4.6–6.5)

## 2015-10-12 LAB — LDL CHOLESTEROL, DIRECT: Direct LDL: 106 mg/dL

## 2015-10-12 NOTE — Progress Notes (Signed)
Subjective:  Patient ID: Tiffany Wells, female    DOB: Nov 30, 1955  Age: 60 y.o. MRN: [id number]  CC: The primary encounter diagnosis was Prediabetes. Diagnoses of History of anemia, Hyperlipidemia, Supraventricular tachycardia ([place]), Obesity, unspecified, Acute post-hemorrhagic anemia, Essential hypertension, SVT (supraventricular tachycardia) ([place]), and History of rib fracture were also pertinent to this visit.  HPI Tiffany Wells presents for follow up on multiple issues.    1) Recetn eye exam by Oris Drone noted Retinal  changes suggestive of diabetic retinopathy and assessment was advised.  Last labs were Nov 2015 and she has had a weight gain of 38 lbs since then due to lack of regular exercise complicated by plantar fasciitis, SVT and bilateral knee replacements complicated by PE,  GI bleed and recent fall resulting in a fractured rib.    She is not fasting but has not eaten in 2 hours. .    Treated for PF by Earnestine Leys with injections which helped her pain.  Discussed available forms of exercise .  She has a recumbent bike.   Had 2 runs of SVTS recently ,  One occurring  with chest pain.  Seeing Fath tomorrow.     Outpatient Prescriptions Prior to Visit  Medication Sig Dispense Refill  . benzonatate (TESSALON) 200 MG capsule Take 1 capsule (200 mg total) by mouth 3 (three) times daily as needed for cough. 60 capsule 1  . cyclobenzaprine (FLEXERIL) 10 MG tablet TAKE 1/2 TO 1 TABLET BY MOUTH 3 TIMES A DAY AS NEEDED FOR BACK PAIN 180 tablet 0  . diltiazem (CARDIZEM CD) 180 MG 24 hr capsule Take 180 mg by mouth daily.    . diphenhydrAMINE (BENADRYL) 25 MG tablet As needed for allergies.     . diphenoxylate-atropine (LOMOTIL) 2.5-0.025 MG per tablet Take 1 tablet by mouth as needed. 30 tablet 5  . estradiol (ESTRACE) 0.1 MG/GM vaginal cream Place [id number] Applicatorfuls vaginally daily. 42.5 g 11  . hyoscyamine (ANASPAZ) 0.125 MG TBDP disintergrating tablet Place 1 tablet (0.125 mg  total) under the tongue every 4 (four) hours as needed for bladder spasms. 30 tablet 0  . omeprazole (PRILOSEC OTC) 20 MG tablet Take 20 mg by mouth daily.      [name] PB-Hyoscy-Atropine-Scopol ER (DONNATAL EXTENTABS) 48.6 MG TBCR Take 1 tablet by mouth daily as needed. For cramping 30 tablet 2  . Biotin 300 MCG TABS Take 1 tablet by mouth daily. Reported on 10/12/2015    . CRANBERRY EXTRACT PO Reported on 10/12/2015    . predniSONE (DELTASONE) 10 MG tablet 6 tablets on Day 1 , then reduce by 1 tablet daily until gone (Patient not taking: Reported on 10/12/2015) 21 tablet 0  . predniSONE (STERAPRED UNI-PAK) 10 MG tablet 6 tablets on Day 1 , then reduce by 1 tablet daily until gone (Patient not taking: Reported on 10/12/2015) 21 tablet 0  . chlorpheniramine-HYDROcodone (TUSSIONEX PENNKINETIC ER) 10-8 MG/5ML SUER Take 5 mLs by mouth at bedtime as needed for cough. (Patient not taking: Reported on 10/12/2015) 180 mL 0  . chlorpheniramine-HYDROcodone (TUSSIONEX) 10-8 MG/5ML LQCR Take 5 mLs by mouth every 12 (twelve) hours as needed for cough. (Patient not taking: Reported on 10/12/2015) 180 mL 0  . ciprofloxacin (CIPRO) 250 MG tablet Take 1 tablet (250 mg total) by mouth 2 (two) times daily. 10 tablet 0  . fluconazole (DIFLUCAN) 150 MG tablet Take 1 tablet (150 mg total) by mouth daily. (Patient not taking: Reported on 10/12/2015) 2 tablet 0  .  fluconazole (DIFLUCAN) 150 MG tablet Take 1 tablet (150 mg total) by mouth daily. (Patient not taking: Reported on 10/12/2015) 2 tablet 3  . fosfomycin (MONUROL) 3 g PACK Take 3 g by mouth once. (Patient not taking: Reported on 10/12/2015) 3 g 0  . Lactobacillus-Inulin ([place]) CAPS Take 1 capsule by mouth daily. (Patient not taking: Reported on 10/12/2015) 14 capsule 0  . levofloxacin (LEVAQUIN) 500 MG tablet Take 1 tablet (500 mg total) by mouth daily. (Patient not taking: Reported on 10/12/2015) 7 tablet 0  . metoprolol succinate (TOPROL-XL) 25 MG 24 hr  tablet Take 2 tablets (50 mg total) by mouth daily. 60 tablet 0  . metoprolol succinate (TOPROL-XL) 25 MG 24 hr tablet TAKE 1 TABLET BY MOUTH TWICE DAILY MUST MAKE AN APPOINTMENT 60 tablet 4  . metroNIDAZOLE (FLAGYL) 500 MG tablet Take 500 mg by mouth 3 (three) times daily.    . Multiple Vitamin (MULTIVITAMIN) tablet Take 1 tablet by mouth daily.      . nitrofurantoin, macrocrystal-monohydrate, (MACROBID) 100 MG capsule Take 1 capsule (100 mg total) by mouth 2 (two) times daily. (Patient not taking: Reported on 10/12/2015) 14 capsule 0  . nitrofurantoin, macrocrystal-monohydrate, (MACROBID) 100 MG capsule Take 1 capsule (100 mg total) by mouth 2 (two) times daily. (Patient not taking: Reported on 10/12/2015) 14 capsule 0  . nystatin (MYCOSTATIN) 100000 UNIT/ML suspension Take 5 mLs (500,000 Units total) by mouth 4 (four) times daily. (Patient not taking: Reported on 10/12/2015) 100 mL 0  . phenazopyridine (PYRIDIUM) 200 MG tablet Take 1 tablet (200 mg total) by mouth 3 (three) times daily as needed for pain. (Patient not taking: Reported on 10/12/2015) 10 tablet 0  . traMADol (ULTRAM) 50 MG tablet Take 1 tablet (50 mg total) by mouth every 8 (eight) hours as needed. (Patient not taking: Reported on 10/12/2015) 120 tablet 0  . XARELTO 20 MG TABS tablet TAKE 1 TABLET BY MOUTH DAILY WITH SUPPER. (Patient not taking: Reported on 10/12/2015) 30 tablet 5   No facility-administered medications prior to visit.    Review of Systems;  Patient denies headache, fevers, malaise, unintentional weight loss, skin rash, eye pain, sinus congestion and sinus pain, sore throat, dysphagia,  hemoptysis , cough, dyspnea, wheezing, chest pain, palpitations, orthopnea, edema, abdominal pain, nausea, melena, diarrhea, constipation, flank pain, dysuria, hematuria, urinary  Frequency, nocturia, numbness, tingling, seizures,  Focal weakness, Loss of consciousness,  Tremor, insomnia, depression, anxiety, and suicidal ideation.       Objective:  BP 104/72 mmHg  Pulse 91  Temp(Src) 98.5 F (36.9 C) (Oral)  Resp 12  Ht 5\' 8"  (1.727 m)  Wt 295 lb (133.811 kg)  BMI 44.86 kg/m2  SpO2 98%  BP Readings from Last 3 Encounters:  10/12/15 104/72  02/20/14 117/68  02/03/14 110/78    Wt Readings from Last 3 Encounters:  10/12/15 295 lb (133.811 kg)  02/20/14 257 lb (116.574 kg)  02/03/14 263 lb 8 oz (119.523 kg)    General appearance: alert, cooperative and appears stated age Ears: normal TM's and external ear canals both ears Throat: lips, mucosa, and tongue normal; teeth and gums normal Neck: no adenopathy, no carotid bruit, supple, symmetrical, trachea midline and thyroid not enlarged, symmetric, no tenderness/mass/nodules Back: symmetric, no curvature. ROM normal. No CVA tenderness. Lungs: clear to auscultation bilaterally Heart: regular rate and rhythm, S1, S2 normal, no murmur, click, rub or gallop Abdomen: soft, non-tender; bowel sounds normal; no masses,  no organomegaly Pulses: 2+ and symmetric  Skin: Skin color, texture, turgor normal. No rashes or lesions Lymph nodes: Cervical, supraclavicular, and axillary nodes normal.  Lab Results  Component Value Date   HGBA1C 5.9 10/12/2015    Lab Results  Component Value Date   CREATININE 0.83 10/12/2015   CREATININE 0.88 02/15/2014   CREATININE 0.79 02/13/2014   CREATININE 0.81 02/13/2014    Lab Results  Component Value Date   WBC 14.3* 10/12/2015   HGB 13.5 10/12/2015   HCT 40.8 10/12/2015   PLT 240.0 10/12/2015   GLUCOSE 93 10/12/2015   CHOL 199 05/28/2013   TRIG 198.0* 05/28/2013   HDL 33.40* 05/28/2013   LDLDIRECT 106.0 10/12/2015   LDLCALC 126* 05/28/2013   ALT 14 10/12/2015   AST 13 10/12/2015   NA 141 10/12/2015   K 3.9 10/12/2015   CL 103 10/12/2015   CREATININE 0.83 10/12/2015   BUN 13 10/12/2015   CO2 32 10/12/2015   TSH 3.66 10/12/2015   INR 1.1 02/13/2014   HGBA1C 5.9 10/12/2015    Dg Chest 2 View  09/18/2015   CLINICAL DATA:  Status post fall 4 days ago with persistent left-sided chest wall pain ; history of hypertension EXAM: CHEST  2 VIEW COMPARISON:  Portable chest x-ray dated November 30, 2013. FINDINGS: The lungs are adequately inflated and clear. The heart and pulmonary vascularity are normal. The mediastinum is normal in width. There is mild tortuosity of the descending thoracic aorta. There is no pneumothorax or pleural effusion. The bony structures exhibit no acute abnormalities. IMPRESSION: There is no active cardiopulmonary disease nor evidence of acute chest trauma. The rib detail series is reported separately. Electronically Signed   By: David  Martinique M.D.   On: 09/18/2015 13:43   Dg Ribs Unilateral Left  09/18/2015  CLINICAL DATA:  Status post fall striking the chest wall 4 days ago. Persistent symptoms. EXAM: LEFT RIBS - 2 VIEW COMPARISON:  Chest x-ray of today's date FINDINGS: There is a mildly angulated fracture of the extreme anterior aspect of the left tenth rib. The adjacent ribs appear intact. No pleural effusion or pneumothorax is observed. IMPRESSION: Mildly angulated fracture of the anterior aspect of the left tenth rib. Electronically Signed   By: David  Martinique M.D.   On: 09/18/2015 13:45    Assessment & Plan:   Problem List Items Addressed This Visit    Essential hypertension    Her previous  elevations were due to daily use of nonsteroidal anti-inflammatories       Relevant Medications   metoprolol succinate (TOPROL-XL) 25 MG 24 hr tablet   Obesity, unspecified    Weight gain discussed.  I have addressed  BMI  And prediabetes state (a1c 5.9, fasting glucose of 93) and recommended a low glycemic index diet utilizing smaller more frequent meals to increase metabolism.  I have also recommended that patient start exercising with a goal of 30 minutes of aerobic exercise a minimum of 5 days per week.   Lab Results  Component Value Date   TSH 3.66 10/12/2015   Lab Results  Component  Value Date   ALT 14 10/12/2015   AST 13 10/12/2015   ALKPHOS 83 10/12/2015   BILITOT 0.3 10/12/2015   Lab Results  Component Value Date   HGBA1C 5.9 10/12/2015   Lab Results  Component Value Date   CHOL 199 05/28/2013   HDL 33.40* 05/28/2013   LDLCALC 126* 05/28/2013   LDLDIRECT 106.0 10/12/2015   TRIG 198.0* 05/28/2013   CHOLHDL 6 05/28/2013  SVT (supraventricular tachycardia) ([facility])    Recent increase in Toprol dose to 75 mg daily  For recurrent runs to 130.  Appt with Ubaldo Glassing is in the very near future.   Lab Results  Component Value Date   TSH 3.66 10/12/2015   Lab Results  Component Value Date   NA 141 10/12/2015   K 3.9 10/12/2015   CL 103 10/12/2015   CO2 32 10/12/2015   Mg is 2.0 on recheck.       Relevant Medications   metoprolol succinate (TOPROL-XL) 25 MG 24 hr tablet   Acute post-hemorrhagic anemia     recurrent with recent episode of lower GI bleed now resolved as well  Lab Results  Component Value Date   WBC 14.3* 10/12/2015   HGB 13.5 10/12/2015   HCT 40.8 10/12/2015   MCV 81.1 10/12/2015   PLT 240.0 10/12/2015         History of rib fracture    Recent fall outside resulted in a mildy angulated  left tenth rib fracture without effusion or PTX. , occurred in early June a month ago.  Symptoms have resolved.        Other Visit Diagnoses    Prediabetes    -  Primary    Relevant Orders    Comprehensive metabolic panel (Completed)    Hemoglobin A1c (Completed)    History of anemia        Relevant Orders    CBC with Differential/Platelet (Completed)    Hyperlipidemia        Relevant Medications    metoprolol succinate (TOPROL-XL) 25 MG 24 hr tablet    Other Relevant Orders    LDL cholesterol, direct (Completed)    Supraventricular tachycardia ([facility])        Relevant Medications    metoprolol succinate (TOPROL-XL) 25 MG 24 hr tablet    Other Relevant Orders    Magnesium (Completed)    TSH (Completed)       I have discontinued  Ms. Nicolini's multivitamin, XARELTO, chlorpheniramine-HYDROcodone, nystatin, metroNIDAZOLE, ciprofloxacin, [employer], fluconazole, levofloxacin, nitrofurantoin (macrocrystal-monohydrate), phenazopyridine, nitrofurantoin (macrocrystal-monohydrate), fosfomycin, fluconazole, chlorpheniramine-HYDROcodone, traMADol, and metoprolol succinate. I have also changed her metoprolol succinate. Additionally, I am having her maintain her diphenhydrAMINE, Biotin, omeprazole, CRANBERRY EXTRACT PO, diltiazem, predniSONE, diphenoxylate-atropine, PB-Hyoscy-Atropine-Scopol ER, estradiol, hyoscyamine, predniSONE, benzonatate, and cyclobenzaprine.  Meds ordered this encounter  Medications  . metoprolol succinate (TOPROL-XL) 25 MG 24 hr tablet    Sig: Take 3 tablets (75 mg total) by mouth daily.    Dispense:  90 tablet    Refill:  11    Medications Discontinued During This Encounter  Medication Reason  . chlorpheniramine-HYDROcodone (TUSSIONEX) 10-8 MG/5ML LQCR Error  . chlorpheniramine-HYDROcodone (TUSSIONEX PENNKINETIC ER) 10-8 MG/5ML SUER Error  . ciprofloxacin (CIPRO) 250 MG tablet Completed Course  . metoprolol succinate (TOPROL-XL) 25 MG 24 hr tablet Change in therapy  . metroNIDAZOLE (FLAGYL) 500 MG tablet Completed Course  . Multiple Vitamin (MULTIVITAMIN) tablet Error  . fluconazole (DIFLUCAN) 150 MG tablet   . fluconazole (DIFLUCAN) 150 MG tablet   . fosfomycin (MONUROL) 3 g PACK   . Lactobacillus-Inulin ([place]) CAPS   . levofloxacin (LEVAQUIN) 500 MG tablet   . nitrofurantoin, macrocrystal-monohydrate, (MACROBID) 100 MG capsule   . nitrofurantoin, macrocrystal-monohydrate, (MACROBID) 100 MG capsule   . nystatin (MYCOSTATIN) 100000 UNIT/ML suspension   . phenazopyridine (PYRIDIUM) 200 MG tablet   . traMADol (ULTRAM) 50 MG tablet   . XARELTO 20 MG TABS tablet   .  metoprolol succinate (TOPROL-XL) 25 MG 24 hr tablet Reorder    Follow-up: No Follow-up on  file.   Crecencio Mc, MD

## 2015-10-12 NOTE — Progress Notes (Signed)
Pre-visit discussion using our clinic review tool. No additional management support is needed unless otherwise documented below in the visit note.  

## 2015-10-12 NOTE — Patient Instructions (Signed)
  You might want to try a premixed protein drink called Premier Protein shake in the morning.  It is less $$$ and very low sugar.    160 cal  30 g protein  1 g sugar 50% calcium needs    Try the Halo Top and E Enlighteneent brands of ice cream  To shave off cal and carbs \

## 2015-10-13 ENCOUNTER — Encounter: Payer: Self-pay | Admitting: Internal Medicine

## 2015-10-13 DIAGNOSIS — I471 Supraventricular tachycardia: Secondary | ICD-10-CM | POA: Diagnosis not present

## 2015-10-13 DIAGNOSIS — Z8781 Personal history of (healed) traumatic fracture: Secondary | ICD-10-CM | POA: Insufficient documentation

## 2015-10-13 MED ORDER — METOPROLOL SUCCINATE ER 25 MG PO TB24: 75.0000 mg | ORAL_TABLET | Freq: Every day | ORAL | Status: DC

## 2015-10-13 NOTE — Assessment & Plan Note (Signed)
recurrent with recent episode of lower GI bleed now resolved as well  Lab Results  Component Value Date   WBC 14.3* 10/12/2015   HGB 13.5 10/12/2015   HCT 40.8 10/12/2015   MCV 81.1 10/12/2015   PLT 240.0 10/12/2015

## 2015-10-13 NOTE — Assessment & Plan Note (Signed)
Recent fall outside resulted in a mildy angulated  left tenth rib fracture without effusion or PTX. , occurred in early June a month ago.  Symptoms have resolved.

## 2015-10-13 NOTE — Assessment & Plan Note (Signed)
Her previous  elevations were due to daily use of nonsteroidal anti-inflammatories

## 2015-10-13 NOTE — Assessment & Plan Note (Signed)
Recent increase in Toprol dose to 75 mg daily  For recurrent runs to 130.  Appt with Ubaldo Glassing is in the very near future.   Lab Results  Component Value Date   TSH 3.66 10/12/2015   Lab Results  Component Value Date   NA 141 10/12/2015   K 3.9 10/12/2015   CL 103 10/12/2015   CO2 32 10/12/2015   Mg is 2.0 on recheck.

## 2015-10-13 NOTE — Assessment & Plan Note (Addendum)
Weight gain discussed.  I have addressed  BMI  And prediabetes state (a1c 5.9, fasting glucose of 93) and recommended a low glycemic index diet utilizing smaller more frequent meals to increase metabolism.  I have also recommended that patient start exercising with a goal of 30 minutes of aerobic exercise a minimum of 5 days per week.   Lab Results  Component Value Date   TSH 3.66 10/12/2015   Lab Results  Component Value Date   ALT 14 10/12/2015   AST 13 10/12/2015   ALKPHOS 83 10/12/2015   BILITOT 0.3 10/12/2015   Lab Results  Component Value Date   HGBA1C 5.9 10/12/2015   Lab Results  Component Value Date   CHOL 199 05/28/2013   HDL 33.40* 05/28/2013   LDLCALC 126* 05/28/2013   LDLDIRECT 106.0 10/12/2015   TRIG 198.0* 05/28/2013   CHOLHDL 6 05/28/2013

## 2015-10-28 ENCOUNTER — Other Ambulatory Visit: Payer: Self-pay | Admitting: Internal Medicine

## 2015-11-12 IMAGING — CT CT ABD-PELV W/ CM
2 of 5 series · 17 of 46 positions shown, 19 images · IV contrast (isovue)
Comparison: CT scan of January 01, 2014.

CLINICAL DATA: Ischemic colitis.

EXAM:
CT ABDOMEN AND PELVIS WITH CONTRAST
TECHNIQUE: Multidetector CT imaging of the abdomen and pelvis was performed
using the standard protocol following bolus administration of
intravenous contrast.
CONTRAST:  100 mL of Isovue 370 intravenously.

[Series 2: routine abd pel with · axial · 0.81mm/px · z∈[-406,+24]mm · 14 of 96 slices shown, 16 images]
[im 5/96  soft-tissue]
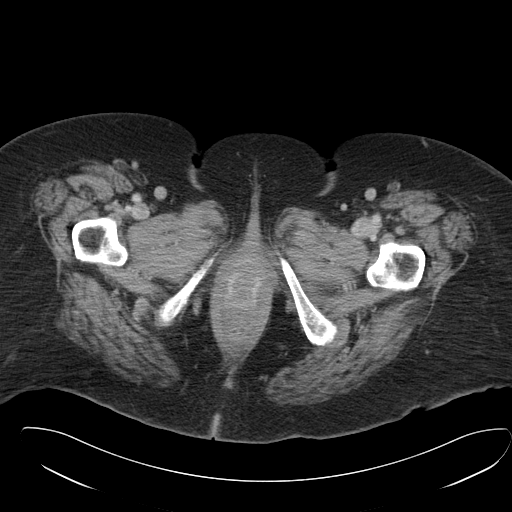
[im 5/96  bone]
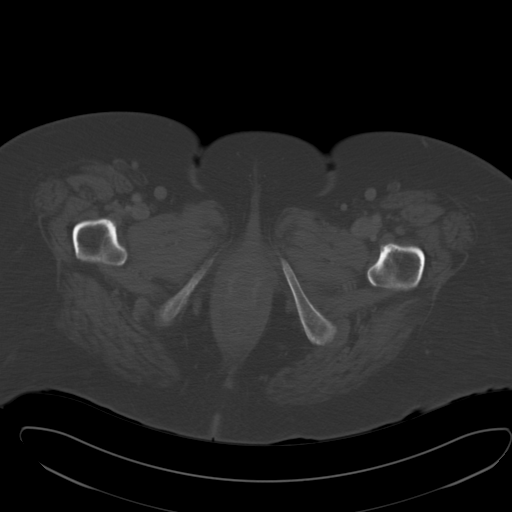
[im 15/96  soft-tissue]
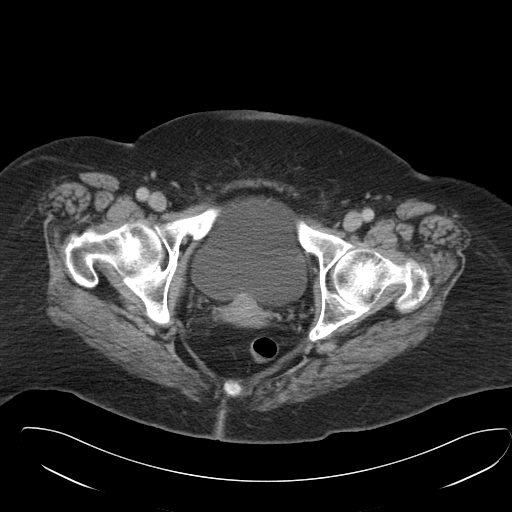
[im 20/96  soft-tissue]
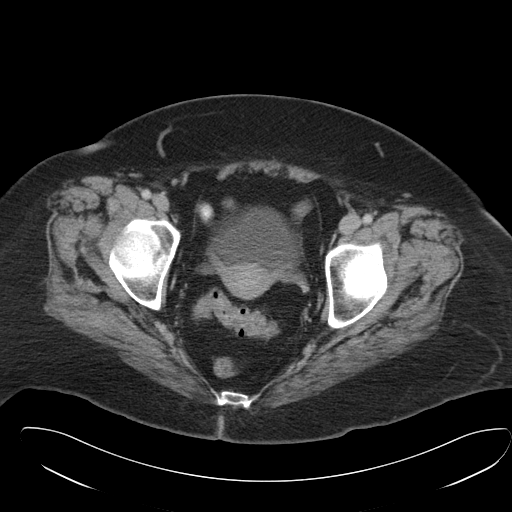
[im 24/96  soft-tissue]
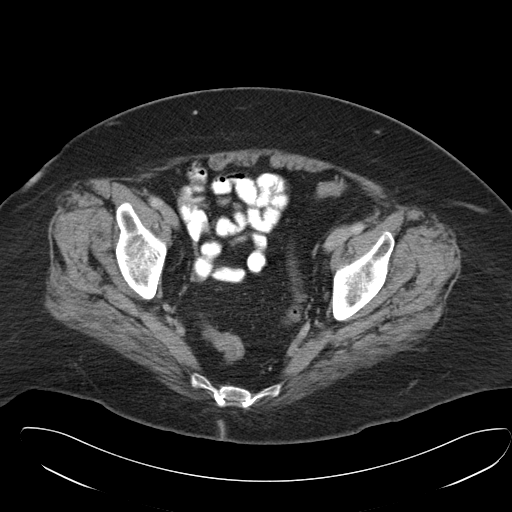
[im 34/96  soft-tissue]
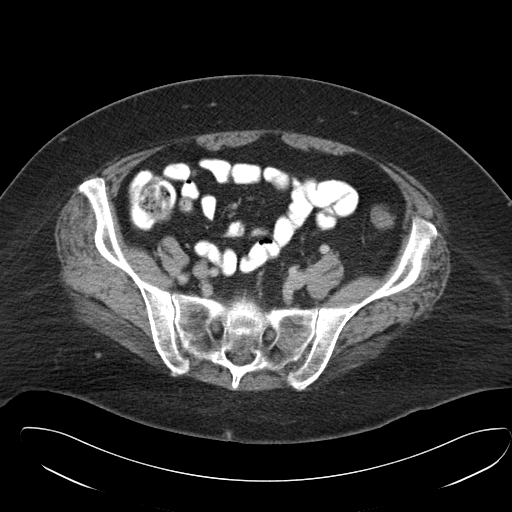
[im 39/96  soft-tissue]
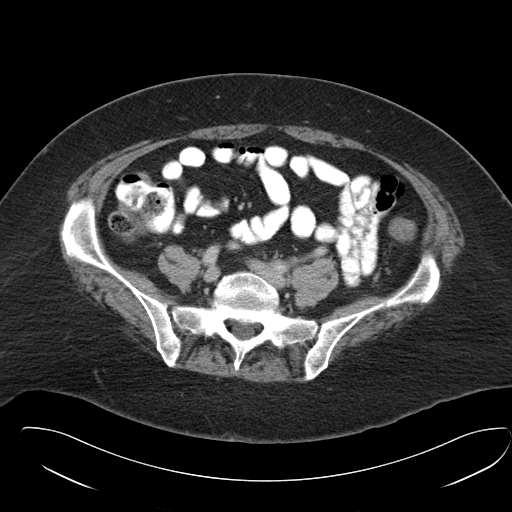
[im 43/96  soft-tissue]
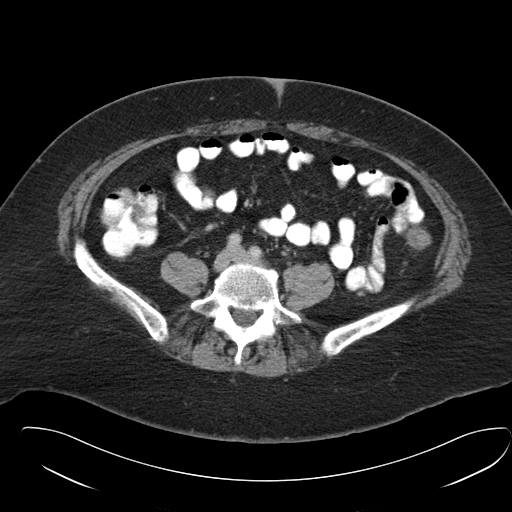
[im 53/96  soft-tissue]
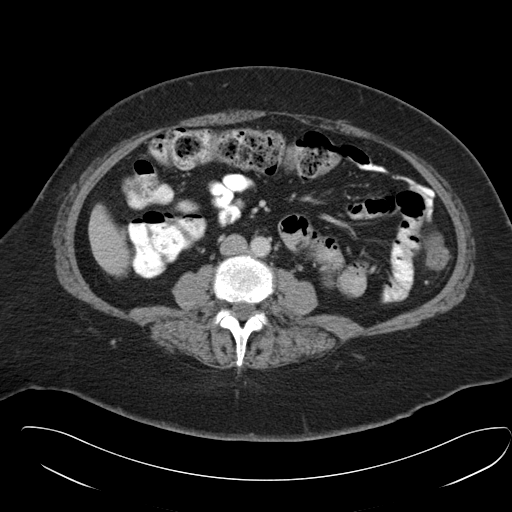
[im 58/96  soft-tissue]
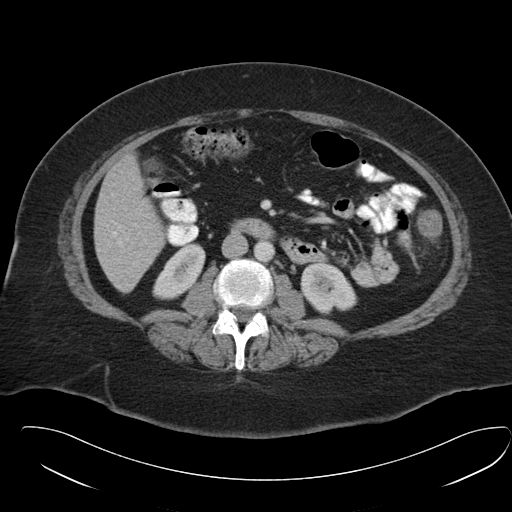
[im 58/96  bone]
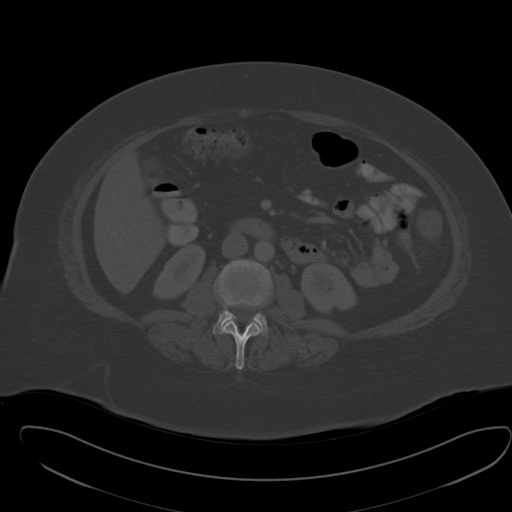
[im 62/96  soft-tissue]
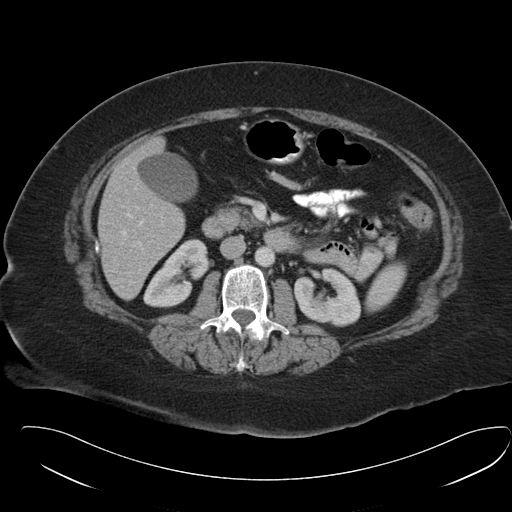
[im 72/96  soft-tissue]
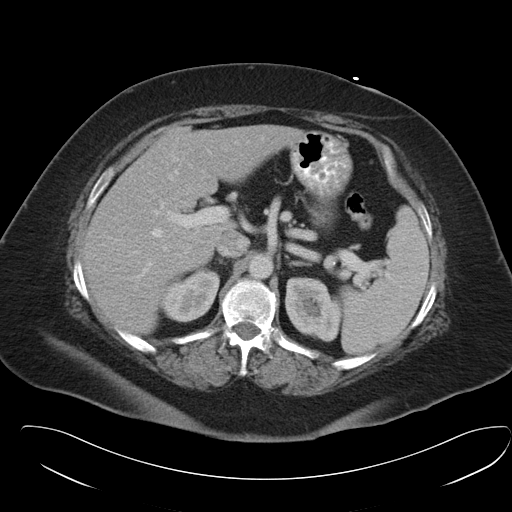
[im 77/96  soft-tissue]
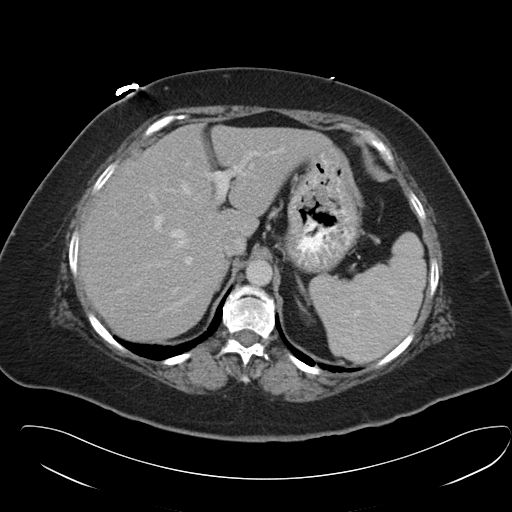
[im 81/96  soft-tissue]
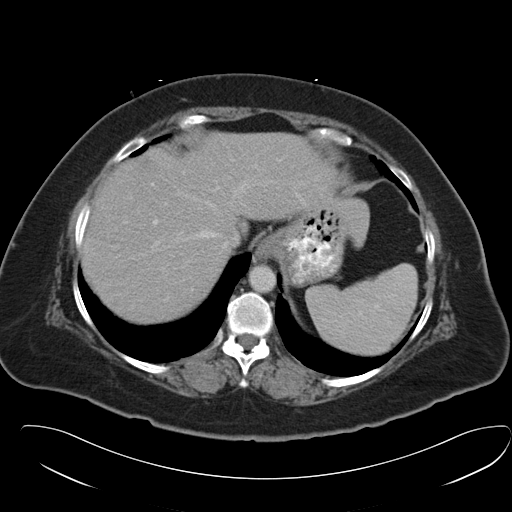
[im 91/96  soft-tissue]
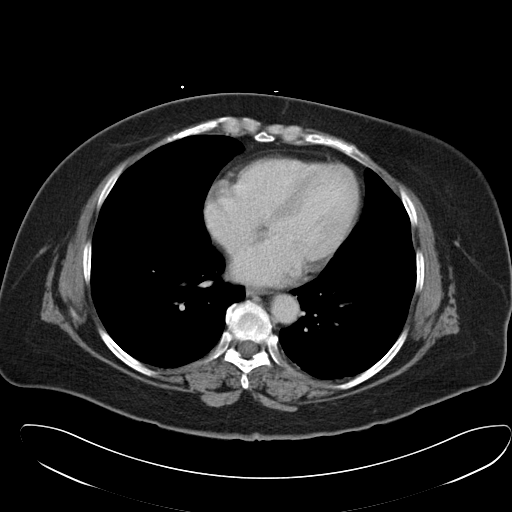

[Series 6: cor routine abd pel with · coronal · 0.79mm/px · 3 of 130 slices shown]
[im 44/130  soft-tissue]
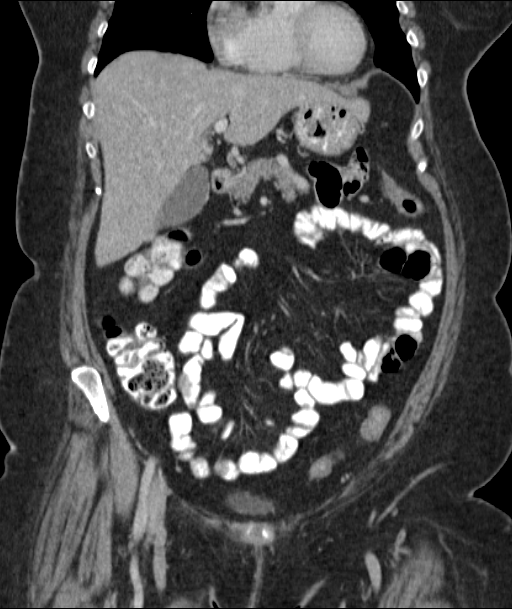
[im 58/130  soft-tissue]
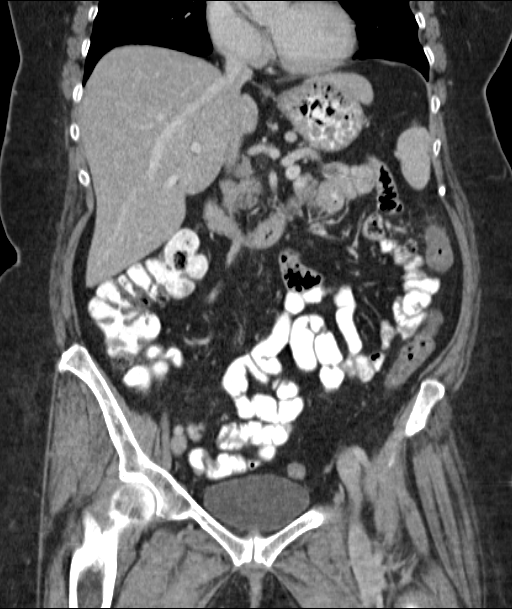
[im 72/130  soft-tissue]
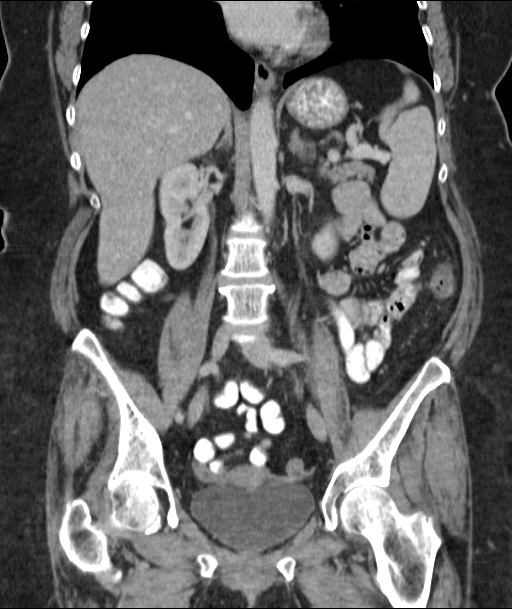

[17 of 46 positions shown; findings below may reference images not displayed]

FINDINGS: Visualized lung bases appear normal. No significant osseous
abnormality is noted.

No gallstones are noted. The liver, spleen and pancreas appear
normal. Adrenal glands and kidneys appear normal. No hydronephrosis
or renal obstruction is noted. No renal or ureteral calculi are
noted. The appendix appears normal. Mild wall thickening is seen
involving the splenic flexure of the colon with minimal surrounding
inflammation suggesting focal inflammatory colitis. There is no
evidence of bowel obstruction. No pneumatosis or biliary gas is
noted to suggest ischemic bowel. No abnormal fluid collection is
noted. Abdominal aorta appears normal. Urinary bladder and uterus
appear normal. Ovaries appear normal. No significant adenopathy is
noted.
IMPRESSION: Findings consistent with mild inflammatory colitis of the splenic
flexure. No pneumatosis is seen to suggest ischemic colitis.

## 2015-11-14 ENCOUNTER — Encounter: Payer: Self-pay | Admitting: Internal Medicine

## 2015-11-17 ENCOUNTER — Other Ambulatory Visit: Payer: Self-pay | Admitting: Internal Medicine

## 2015-11-17 MED ORDER — DIPHENOXYLATE-ATROPINE 2.5-0.025 MG PO TABS: 1.0000 | ORAL_TABLET | ORAL | 5 refills | Status: DC | PRN

## 2015-11-17 MED ORDER — PB-HYOSCY-ATROPINE-SCOPOL ER 48.6 MG PO TBCR: 1.0000 | EXTENDED_RELEASE_TABLET | Freq: Every day | ORAL | 2 refills | Status: DC | PRN

## 2015-11-17 MED ORDER — PB-HYOSCY-ATROPINE-SCOPOLAMINE 16.2 MG PO TABS: 1.0000 | ORAL_TABLET | Freq: Three times a day (TID) | ORAL | 3 refills | Status: DC | PRN

## 2015-11-17 NOTE — Telephone Encounter (Signed)
Refilled in 05/2014. Patient last seen 10/12/15. Please advise?

## 2016-01-01 ENCOUNTER — Other Ambulatory Visit: Payer: Self-pay | Admitting: Internal Medicine

## 2016-01-01 DIAGNOSIS — N390 Urinary tract infection, site not specified: Secondary | ICD-10-CM

## 2016-01-01 MED ORDER — NITROFURANTOIN MONOHYD MACRO 100 MG PO CAPS: 100.0000 mg | ORAL_CAPSULE | Freq: Two times a day (BID) | ORAL | 0 refills | Status: DC

## 2016-01-04 ENCOUNTER — Telehealth: Payer: Self-pay | Admitting: Internal Medicine

## 2016-01-10 ENCOUNTER — Encounter: Payer: Self-pay | Admitting: Internal Medicine

## 2016-01-11 ENCOUNTER — Other Ambulatory Visit: Payer: Self-pay

## 2016-01-11 ENCOUNTER — Other Ambulatory Visit: Payer: Self-pay | Admitting: General Surgery

## 2016-01-11 ENCOUNTER — Other Ambulatory Visit: Payer: Self-pay | Admitting: Internal Medicine

## 2016-01-11 DIAGNOSIS — R350 Frequency of micturition: Secondary | ICD-10-CM | POA: Diagnosis not present

## 2016-01-13 LAB — URINE CULTURE: Organism ID, Bacteria: NO GROWTH

## 2016-01-22 DIAGNOSIS — M75121 Complete rotator cuff tear or rupture of right shoulder, not specified as traumatic: Secondary | ICD-10-CM | POA: Diagnosis not present

## 2016-01-22 DIAGNOSIS — M7581 Other shoulder lesions, right shoulder: Secondary | ICD-10-CM | POA: Insufficient documentation

## 2016-01-22 DIAGNOSIS — M25511 Pain in right shoulder: Secondary | ICD-10-CM | POA: Diagnosis not present

## 2016-01-25 ENCOUNTER — Ambulatory Visit: Payer: 59

## 2016-01-25 ENCOUNTER — Other Ambulatory Visit: Payer: Self-pay | Admitting: Surgery

## 2016-01-25 DIAGNOSIS — R3915 Urgency of urination: Secondary | ICD-10-CM

## 2016-01-25 DIAGNOSIS — G8929 Other chronic pain: Secondary | ICD-10-CM

## 2016-01-25 DIAGNOSIS — M25511 Pain in right shoulder: Principal | ICD-10-CM

## 2016-01-26 LAB — MICROSCOPIC EXAMINATION
Casts: NONE SEEN /lpf
Epithelial Cells (non renal): >10 /hpf — AB (ref 0–10)
RBC, UA: >30 /hpf — AB (ref 0–?)
WBC, UA: >30 /hpf — AB (ref 0–?)

## 2016-01-26 LAB — URINALYSIS, COMPLETE
Bilirubin, UA: NEGATIVE
Glucose, UA: NEGATIVE
Ketones, UA: NEGATIVE
Nitrite, UA: POSITIVE — AB
Specific Gravity, UA: 1.019 (ref 1.005–1.030)
Urobilinogen, Ur: 1 mg/dL (ref 0.2–1.0)
pH, UA: 5.5 (ref 5.0–7.5)

## 2016-01-27 LAB — URINE CULTURE

## 2016-01-28 ENCOUNTER — Other Ambulatory Visit: Payer: Self-pay | Admitting: Internal Medicine

## 2016-01-28 MED ORDER — SULFAMETHOXAZOLE-TRIMETHOPRIM 800-160 MG PO TABS: 1.0000 | ORAL_TABLET | Freq: Two times a day (BID) | ORAL | 1 refills | Status: DC

## 2016-02-01 DIAGNOSIS — R87610 Atypical squamous cells of undetermined significance on cytologic smear of cervix (ASC-US): Secondary | ICD-10-CM | POA: Diagnosis not present

## 2016-02-08 ENCOUNTER — Ambulatory Visit
Admission: RE | Admit: 2016-02-08 | Discharge: 2016-02-08 | Disposition: A | Discharge status: Home / Self Care | Payer: 59 | Source: Ambulatory Visit | Attending: Surgery | Admitting: Surgery

## 2016-02-08 DIAGNOSIS — G8929 Other chronic pain: Secondary | ICD-10-CM

## 2016-02-08 DIAGNOSIS — M7581 Other shoulder lesions, right shoulder: Secondary | ICD-10-CM | POA: Insufficient documentation

## 2016-02-08 DIAGNOSIS — M25511 Pain in right shoulder: Secondary | ICD-10-CM | POA: Insufficient documentation

## 2016-02-08 DIAGNOSIS — M65111 Other infective (teno)synovitis, right shoulder: Secondary | ICD-10-CM | POA: Diagnosis not present

## 2016-02-15 DIAGNOSIS — M75121 Complete rotator cuff tear or rupture of right shoulder, not specified as traumatic: Secondary | ICD-10-CM | POA: Diagnosis not present

## 2016-02-15 DIAGNOSIS — M7581 Other shoulder lesions, right shoulder: Secondary | ICD-10-CM | POA: Diagnosis not present

## 2016-03-03 ENCOUNTER — Other Ambulatory Visit: Payer: Self-pay | Admitting: Internal Medicine

## 2016-03-03 ENCOUNTER — Encounter: Payer: Self-pay | Admitting: Internal Medicine

## 2016-03-03 MED ORDER — BENZONATATE 200 MG PO CAPS: 200.0000 mg | ORAL_CAPSULE | Freq: Three times a day (TID) | ORAL | 1 refills | Status: DC | PRN

## 2016-03-03 MED ORDER — HYDROCOD POLST-CPM POLST ER 10-8 MG/5ML PO SUER: 5.0000 mL | Freq: Every evening | ORAL | 0 refills | Status: DC | PRN

## 2016-03-03 MED ORDER — PREDNISONE 10 MG PO TABS: ORAL_TABLET | ORAL | 0 refills | Status: DC

## 2016-03-17 DIAGNOSIS — D2239 Melanocytic nevi of other parts of face: Secondary | ICD-10-CM | POA: Diagnosis not present

## 2016-03-17 DIAGNOSIS — L820 Inflamed seborrheic keratosis: Secondary | ICD-10-CM | POA: Diagnosis not present

## 2016-03-17 DIAGNOSIS — D485 Neoplasm of uncertain behavior of skin: Secondary | ICD-10-CM | POA: Diagnosis not present

## 2016-04-19 ENCOUNTER — Other Ambulatory Visit: Payer: Self-pay | Admitting: Internal Medicine

## 2016-04-19 ENCOUNTER — Encounter: Payer: Self-pay | Admitting: Internal Medicine

## 2016-04-19 DIAGNOSIS — N390 Urinary tract infection, site not specified: Secondary | ICD-10-CM

## 2016-04-20 ENCOUNTER — Other Ambulatory Visit: Payer: Self-pay | Admitting: Internal Medicine

## 2016-04-20 ENCOUNTER — Other Ambulatory Visit: Payer: Self-pay | Admitting: *Deleted

## 2016-04-20 DIAGNOSIS — Z9189 Other specified personal risk factors, not elsewhere classified: Secondary | ICD-10-CM | POA: Diagnosis not present

## 2016-04-20 DIAGNOSIS — I2782 Chronic pulmonary embolism: Secondary | ICD-10-CM | POA: Diagnosis not present

## 2016-04-20 DIAGNOSIS — I471 Supraventricular tachycardia: Secondary | ICD-10-CM | POA: Diagnosis not present

## 2016-04-20 DIAGNOSIS — I82409 Acute embolism and thrombosis of unspecified deep veins of unspecified lower extremity: Secondary | ICD-10-CM | POA: Diagnosis not present

## 2016-04-20 DIAGNOSIS — N390 Urinary tract infection, site not specified: Secondary | ICD-10-CM

## 2016-04-20 MED ORDER — FOSFOMYCIN TROMETHAMINE 3 G PO PACK: 3.0000 g | PACK | Freq: Once | ORAL | 0 refills | Status: AC

## 2016-04-20 NOTE — Telephone Encounter (Signed)
Macrobid 100g Please advise Urine culture ordered today 04/20/2016  Last OV 10/12/2015 Please advise

## 2016-04-20 NOTE — Telephone Encounter (Signed)
Check with patient (Dr Hoyos's wife ).  bc I sent fosfomycin to [facility] per e mail request

## 2016-04-21 ENCOUNTER — Other Ambulatory Visit: Payer: Self-pay | Admitting: Internal Medicine

## 2016-04-21 ENCOUNTER — Encounter: Payer: Self-pay | Admitting: *Deleted

## 2016-04-21 DIAGNOSIS — N390 Urinary tract infection, site not specified: Secondary | ICD-10-CM

## 2016-04-21 MED ORDER — SULFAMETHOXAZOLE-TRIMETHOPRIM 800-160 MG PO TABS: 1.0000 | ORAL_TABLET | Freq: Two times a day (BID) | ORAL | 1 refills | Status: DC

## 2016-04-21 MED ORDER — NITROFURANTOIN MONOHYD MACRO 100 MG PO CAPS: 100.0000 mg | ORAL_CAPSULE | Freq: Two times a day (BID) | ORAL | 0 refills | Status: DC

## 2016-04-25 LAB — CULTURE, URINE COMPREHENSIVE

## 2016-04-25 LAB — URINALYSIS, ROUTINE W REFLEX MICROSCOPIC

## 2016-05-05 DIAGNOSIS — L448 Other specified papulosquamous disorders: Secondary | ICD-10-CM | POA: Diagnosis not present

## 2016-05-05 DIAGNOSIS — L538 Other specified erythematous conditions: Secondary | ICD-10-CM | POA: Diagnosis not present

## 2016-06-18 ENCOUNTER — Other Ambulatory Visit: Payer: Self-pay | Admitting: Internal Medicine

## 2016-06-18 MED ORDER — AZITHROMYCIN 250 MG PO TABS: ORAL_TABLET | ORAL | 0 refills | Status: DC

## 2016-06-22 ENCOUNTER — Other Ambulatory Visit: Payer: Self-pay | Admitting: Internal Medicine

## 2016-06-24 MED ORDER — HYDROCOD POLST-CPM POLST ER 10-8 MG/5ML PO SUER: 5.0000 mL | Freq: Every evening | ORAL | 0 refills | Status: DC | PRN

## 2016-06-24 NOTE — Telephone Encounter (Signed)
Tussionex refilled  ,  Needs to pick up

## 2016-06-24 NOTE — Telephone Encounter (Signed)
Spoke with pt and informed her that her rx is ready to be picked up and that Dr. Derrel Nip had sent her a [employer]. Pt stated that she would be by this afternoon to pick up rx.

## 2016-06-28 ENCOUNTER — Other Ambulatory Visit: Payer: Self-pay | Admitting: Internal Medicine

## 2016-06-28 ENCOUNTER — Ambulatory Visit (INDEPENDENT_AMBULATORY_CARE_PROVIDER_SITE_OTHER): Payer: 59

## 2016-06-28 DIAGNOSIS — R059 Cough, unspecified: Secondary | ICD-10-CM

## 2016-06-28 DIAGNOSIS — R05 Cough: Secondary | ICD-10-CM

## 2016-06-28 MED ORDER — PREDNISONE 10 MG PO TABS: ORAL_TABLET | ORAL | 0 refills | Status: DC

## 2016-06-28 MED ORDER — AMOXICILLIN-POT CLAVULANATE 875-125 MG PO TABS: 1.0000 | ORAL_TABLET | Freq: Two times a day (BID) | ORAL | 0 refills | Status: DC

## 2016-06-29 ENCOUNTER — Encounter: Payer: Self-pay | Admitting: Internal Medicine

## 2016-06-29 ENCOUNTER — Ambulatory Visit (INDEPENDENT_AMBULATORY_CARE_PROVIDER_SITE_OTHER): Payer: 59 | Admitting: Internal Medicine

## 2016-06-29 DIAGNOSIS — J40 Bronchitis, not specified as acute or chronic: Secondary | ICD-10-CM | POA: Insufficient documentation

## 2016-06-29 DIAGNOSIS — J209 Acute bronchitis, unspecified: Secondary | ICD-10-CM | POA: Diagnosis not present

## 2016-06-29 DIAGNOSIS — H66001 Acute suppurative otitis media without spontaneous rupture of ear drum, right ear: Secondary | ICD-10-CM

## 2016-06-29 NOTE — Assessment & Plan Note (Signed)
Right ear has a serous effusion . augmentin plus prednisone taper

## 2016-06-29 NOTE — Progress Notes (Signed)
Pre visit review using our clinic review tool, if applicable. No additional management support is needed unless otherwise documented below in the visit note. 

## 2016-06-29 NOTE — Progress Notes (Signed)
Subjective:  Patient ID: Tiffany Wells, female    DOB: 11-May-1955  Age: 61 y.o. MRN: [id number]  CC: Diagnoses of Acute bronchitis, unspecified organism and Acute suppurative otitis media of right ear without spontaneous rupture of tympanic membrane, recurrence not specified were pertinent to this visit.  HPI Tiffany Wells presents for evaluation of bronchitis .  symptoms have been present for 2 weeks, started 1-2 days after returning from a [employer] .  No other family members sick.  Started with an episode of vertigo, followed by headache and body aches .  Developed deep barking cough,  Low grade fevers. Improved steadily for a few days once she started  azithromycin and prednisone taper , but several days ago cough returned and her physician husband detected ronchi in the RLL. She also has been having altered sensation and pressure in her right ear .  She has a history of ruptured right TM   Outpatient Medications Prior to Visit  Medication Sig Dispense Refill  . amoxicillin-clavulanate (AUGMENTIN) 875-125 MG tablet Take 1 tablet by mouth 2 (two) times daily. 14 tablet 0  . belladona alk-PHENObarbital (DONNATAL) 16.2 MG tablet Take 1 tablet by mouth every 8 (eight) hours as needed. 120 tablet 3  . benzonatate (TESSALON) 200 MG capsule Take 1 capsule (200 mg total) by mouth 3 (three) times daily as needed for cough. 60 capsule 1  . Biotin 300 MCG TABS Take 1 tablet by mouth daily. Reported on 10/12/2015    . chlorpheniramine-HYDROcodone (TUSSIONEX PENNKINETIC ER) 10-8 MG/5ML SUER Take 5 mLs by mouth at bedtime as needed for cough. 140 mL 0  . CRANBERRY EXTRACT PO Reported on 10/12/2015    . cyclobenzaprine (FLEXERIL) 10 MG tablet TAKE 1/2 TO 1 TABLET BY MOUTH 3 TIMES A DAY AS NEEDED FOR BACK PAIN 180 tablet 2  . diltiazem (CARDIZEM CD) 180 MG 24 hr capsule Take 180 mg by mouth daily.    . diphenhydrAMINE (BENADRYL) 25 MG tablet As needed for allergies.     . diphenoxylate-atropine  (LOMOTIL) 2.5-0.025 MG tablet Take 1 tablet by mouth as needed. 30 tablet 5  . ESTRACE VAGINAL 0.1 MG/GM vaginal cream PLACE 1/4 APPLICATORFUL VAGINALLY DAILY. 42.5 g 11  . hyoscyamine (ANASPAZ) 0.125 MG TBDP disintergrating tablet Place 1 tablet (0.125 mg total) under the tongue every 4 (four) hours as needed for bladder spasms. 30 tablet 0  . metoprolol succinate (TOPROL-XL) 25 MG 24 hr tablet Take 3 tablets (75 mg total) by mouth daily. 90 tablet 11  . omeprazole (PRILOSEC OTC) 20 MG tablet Take 20 mg by mouth daily.      . phenazopyridine (PYRIDIUM) 200 MG tablet TAKE 1 TABLET BY MOUTH 3 TIMES DAILY AS NEEDED FOR PAIN. 10 tablet 1  . predniSONE (DELTASONE) 10 MG tablet 6 tablets daily for 3 days , then reduce by 1 tablet daily until gone 33 tablet 0  . azithromycin (ZITHROMAX) 250 MG tablet 2 tablets one day 1,  One tablet daily until gone (Patient not taking: Reported on 06/29/2016) 6 tablet 0  . nitrofurantoin, macrocrystal-monohydrate, (MACROBID) 100 MG capsule Take 1 capsule (100 mg total) by mouth 2 (two) times daily. (Patient not taking: Reported on 06/29/2016) 14 capsule 0  . sulfamethoxazole-trimethoprim (BACTRIM DS,SEPTRA DS) 800-160 MG tablet Take 1 tablet by mouth 2 (two) times daily. For 14 days (Patient not taking: Reported on 06/29/2016) 28 tablet 1   No facility-administered medications prior to visit.     Review of Systems;  Patient denies , unintentional weight loss, skin rash, eye pain, sinus congestion and sinus pain, sore throat, dysphagia,  hemoptysis , cough, dyspnea, wheezing, chest pain, palpitations, orthopnea, edema, abdominal pain, nausea, melena, diarrhea, constipation, flank pain, dysuria, hematuria, urinary  Frequency, nocturia, numbness, tingling, seizures,  Focal weakness, Loss of consciousness,  Tremor, insomnia, depression, anxiety, and suicidal ideation.      Objective:  BP 126/82 (BP Location: Left Arm, Patient Position: Sitting, Cuff Size: Large)   Pulse 95    Temp 98.2 F (36.8 C) (Oral)   Resp 15   Ht 5' 8" (1.727 m)   Wt 296 lb 3.2 oz (134.4 kg)   SpO2 97%   BMI 45.04 kg/m   BP Readings from Last 3 Encounters:  06/29/16 126/82  10/12/15 104/72  02/20/14 117/68    Wt Readings from Last 3 Encounters:  06/29/16 296 lb 3.2 oz (134.4 kg)  10/12/15 295 lb (133.8 kg)  02/20/14 257 lb (116.6 kg)    General appearance: alert, cooperative and appears stated age Ears: right TM with serous effusion, no erythema or bulging.  Left TM normal  Throat: lips, mucosa, and tongue normal; teeth and gums normal Neck: no adenopathy, no carotid bruit, supple, symmetrical, trachea midline and thyroid not enlarged, symmetric, no tenderness/mass/nodules Back: symmetric, no curvature. ROM normal. No CVA tenderness. Lungs: bilateral ronchi, clear to auscultation bilaterally Heart: regular rate and rhythm, S1, S2 normal, no murmur, click, rub or gallop Abdomen: soft, non-tender; bowel sounds normal; no masses,  no organomegaly Pulses: 2+ and symmetric Skin: Skin color, texture, turgor normal. No rashes or lesions Lymph nodes: Cervical, supraclavicular, and axillary nodes normal.  Lab Results  Component Value Date   HGBA1C 5.9 10/12/2015    Lab Results  Component Value Date   CREATININE 0.83 10/12/2015   CREATININE 0.88 02/15/2014   CREATININE 0.79 02/13/2014   CREATININE 0.81 02/13/2014    Lab Results  Component Value Date   WBC 14.3 (H) 10/12/2015   HGB 13.5 10/12/2015   HCT 40.8 10/12/2015   PLT 240.0 10/12/2015   GLUCOSE 93 10/12/2015   CHOL 199 05/28/2013   TRIG 198.0 (H) 05/28/2013   HDL 33.40 (L) 05/28/2013   LDLDIRECT 106.0 10/12/2015   LDLCALC 126 (H) 05/28/2013   ALT 14 10/12/2015   AST 13 10/12/2015   NA 141 10/12/2015   K 3.9 10/12/2015   CL 103 10/12/2015   CREATININE 0.83 10/12/2015   BUN 13 10/12/2015   CO2 32 10/12/2015   TSH 3.66 10/12/2015   INR 1.1 02/13/2014   HGBA1C 5.9 10/12/2015    Mr Shoulder Right Wo  Contrast  Result Date: 02/08/2016 CLINICAL DATA:  Injured shoulder 1 year ago. Persistent shoulder pain and limited range of motion. EXAM: MRI OF THE RIGHT SHOULDER WITHOUT CONTRAST TECHNIQUE: Multiplanar, multisequence MR imaging of the shoulder was performed. No intravenous contrast was administered. COMPARISON:  None. FINDINGS: Rotator cuff: Significant supraspinous tendinopathy/ tendinosis. Small foci full-thickness perforation involving the anterior aspect of the supraspinous tendon. There is 3 mm of retraction and the tear is 4.5 mm wide. There is associated non focal fluid in the subacromial/subdeltoid bursa. Moderate infraspinatus tendinopathy but no tear. The subscapularis tendon is intact. Muscles:  Normal Biceps long head:  Intact. Acromioclavicular Joint: Mild AC joint degenerative changes. Type 2 acromion. No significant lateral downsloping. There is mild undersurface spurring. Glenohumeral Joint: Minimal/early degenerative changes. No joint effusion. Mild synovitis versus adhesive capsulitis. Labrum:  No definite labral tears. Bones:  No acute  bony findings Other: None IMPRESSION: 1. Significant supraspinous tendinopathy/tendinosis with a small focal full-thickness perforation involving the anterior aspect. 2. Intact long head biceps tendon and glenoid labrum. 3. Mild undersurface spurring from the type 2 acromion. 4. Glenohumeral joint synovitis versus adhesive capsulitis. Electronically Signed   By: Marijo Sanes M.D.   On: 02/08/2016 09:32    Assessment & Plan:   Problem List Items Addressed This Visit    Bronchitis, acute    confirmed with chest x ray.        Otitis media    Right ear has a serous effusion . augmentin plus prednisone taper         I have discontinued Ms. Defelice's nitrofurantoin (macrocrystal-monohydrate), sulfamethoxazole-trimethoprim, azithromycin, and predniSONE. I am also having her maintain her diphenhydrAMINE, Biotin, omeprazole, CRANBERRY EXTRACT PO,  diltiazem, hyoscyamine, metoprolol succinate, ESTRACE VAGINAL, diphenoxylate-atropine, belladona alk-PHENObarbital, phenazopyridine, cyclobenzaprine, benzonatate, chlorpheniramine-HYDROcodone, and amoxicillin-clavulanate.  No orders of the defined types were placed in this encounter.   Medications Discontinued During This Encounter  Medication Reason  . azithromycin (ZITHROMAX) 250 MG tablet Therapy completed  . nitrofurantoin, macrocrystal-monohydrate, (MACROBID) 100 MG capsule Therapy completed  . predniSONE (DELTASONE) 10 MG tablet Therapy completed  . sulfamethoxazole-trimethoprim (BACTRIM DS,SEPTRA DS) 800-160 MG tablet Therapy completed    Follow-up: No Follow-up on file.   Crecencio Mc, MD

## 2016-06-29 NOTE — Assessment & Plan Note (Signed)
confirmed with chest x ray.

## 2016-09-08 ENCOUNTER — Other Ambulatory Visit: Payer: Self-pay | Admitting: Internal Medicine

## 2016-09-08 NOTE — Telephone Encounter (Signed)
Last seen in March for Acute Bronchitis, was prescribed this in 2017, for a PRN cough at the time.  Please advise for refill this came from the pharmacy. thanks

## 2016-09-08 NOTE — Telephone Encounter (Signed)
REFILLED.  dR Dutta'S WIFE,  WELL KNOWN TO ME

## 2016-09-14 ENCOUNTER — Encounter: Payer: Self-pay | Admitting: Internal Medicine

## 2016-09-20 ENCOUNTER — Other Ambulatory Visit: Payer: Self-pay | Admitting: Internal Medicine

## 2016-10-11 ENCOUNTER — Encounter: Payer: Self-pay | Admitting: Internal Medicine

## 2016-10-13 ENCOUNTER — Other Ambulatory Visit: Payer: Self-pay | Admitting: Internal Medicine

## 2016-10-13 DIAGNOSIS — L448 Other specified papulosquamous disorders: Secondary | ICD-10-CM | POA: Diagnosis not present

## 2016-10-13 MED ORDER — NYSTATIN-TRIAMCINOLONE 100000-0.1 UNIT/GM-% EX OINT: 1.0000 "application " | TOPICAL_OINTMENT | Freq: Two times a day (BID) | CUTANEOUS | 5 refills | Status: DC

## 2016-10-14 DIAGNOSIS — L818 Other specified disorders of pigmentation: Secondary | ICD-10-CM | POA: Diagnosis not present

## 2016-12-04 ENCOUNTER — Encounter: Payer: Self-pay | Admitting: Internal Medicine

## 2016-12-05 ENCOUNTER — Other Ambulatory Visit: Payer: Self-pay | Admitting: Internal Medicine

## 2016-12-05 MED ORDER — FLUCONAZOLE 150 MG PO TABS: 150.0000 mg | ORAL_TABLET | Freq: Every day | ORAL | 3 refills | Status: DC

## 2016-12-28 DIAGNOSIS — R195 Other fecal abnormalities: Secondary | ICD-10-CM | POA: Insufficient documentation

## 2016-12-28 DIAGNOSIS — K625 Hemorrhage of anus and rectum: Secondary | ICD-10-CM | POA: Diagnosis not present

## 2016-12-28 DIAGNOSIS — K921 Melena: Secondary | ICD-10-CM | POA: Diagnosis not present

## 2016-12-28 DIAGNOSIS — R1084 Generalized abdominal pain: Secondary | ICD-10-CM | POA: Diagnosis not present

## 2016-12-28 DIAGNOSIS — I82409 Acute embolism and thrombosis of unspecified deep veins of unspecified lower extremity: Secondary | ICD-10-CM

## 2016-12-28 DIAGNOSIS — R194 Change in bowel habit: Secondary | ICD-10-CM | POA: Diagnosis not present

## 2016-12-28 DIAGNOSIS — K589 Irritable bowel syndrome without diarrhea: Secondary | ICD-10-CM | POA: Insufficient documentation

## 2016-12-28 DIAGNOSIS — I499 Cardiac arrhythmia, unspecified: Secondary | ICD-10-CM

## 2016-12-28 HISTORY — DX: Acute embolism and thrombosis of unspecified deep veins of unspecified lower extremity: I82.409

## 2016-12-28 HISTORY — DX: Cardiac arrhythmia, unspecified: I49.9

## 2017-01-11 NOTE — Telephone Encounter (Signed)
Error

## 2017-01-11 NOTE — Telephone Encounter (Signed)
Orders

## 2017-02-17 ENCOUNTER — Encounter: Payer: Self-pay | Admitting: *Deleted

## 2017-02-20 ENCOUNTER — Ambulatory Visit
Admission: RE | Admit: 2017-02-20 | Discharge: 2017-02-20 | Disposition: A | Discharge status: Home / Self Care | Payer: 59 | Source: Ambulatory Visit | Attending: Unknown Physician Specialty | Admitting: Unknown Physician Specialty

## 2017-02-20 ENCOUNTER — Encounter
Admission: RE | Disposition: A | Discharge status: Home / Self Care | Payer: Self-pay | Source: Ambulatory Visit | Attending: Unknown Physician Specialty

## 2017-02-20 ENCOUNTER — Ambulatory Visit: Payer: 59 | Admitting: Anesthesiology

## 2017-02-20 ENCOUNTER — Encounter: Payer: Self-pay | Admitting: *Deleted

## 2017-02-20 DIAGNOSIS — R195 Other fecal abnormalities: Secondary | ICD-10-CM | POA: Diagnosis present

## 2017-02-20 DIAGNOSIS — Z86711 Personal history of pulmonary embolism: Secondary | ICD-10-CM | POA: Diagnosis not present

## 2017-02-20 DIAGNOSIS — K589 Irritable bowel syndrome without diarrhea: Secondary | ICD-10-CM | POA: Diagnosis not present

## 2017-02-20 DIAGNOSIS — I471 Supraventricular tachycardia: Secondary | ICD-10-CM | POA: Diagnosis not present

## 2017-02-20 DIAGNOSIS — Z96653 Presence of artificial knee joint, bilateral: Secondary | ICD-10-CM | POA: Insufficient documentation

## 2017-02-20 DIAGNOSIS — K640 First degree hemorrhoids: Secondary | ICD-10-CM | POA: Insufficient documentation

## 2017-02-20 DIAGNOSIS — Z8719 Personal history of other diseases of the digestive system: Secondary | ICD-10-CM | POA: Diagnosis not present

## 2017-02-20 DIAGNOSIS — I10 Essential (primary) hypertension: Secondary | ICD-10-CM | POA: Diagnosis not present

## 2017-02-20 DIAGNOSIS — Z8249 Family history of ischemic heart disease and other diseases of the circulatory system: Secondary | ICD-10-CM | POA: Diagnosis not present

## 2017-02-20 DIAGNOSIS — Z807 Family history of other malignant neoplasms of lymphoid, hematopoietic and related tissues: Secondary | ICD-10-CM | POA: Insufficient documentation

## 2017-02-20 DIAGNOSIS — Z79890 Hormone replacement therapy (postmenopausal): Secondary | ICD-10-CM | POA: Insufficient documentation

## 2017-02-20 DIAGNOSIS — Z882 Allergy status to sulfonamides status: Secondary | ICD-10-CM | POA: Insufficient documentation

## 2017-02-20 DIAGNOSIS — Z9889 Other specified postprocedural states: Secondary | ICD-10-CM | POA: Diagnosis not present

## 2017-02-20 DIAGNOSIS — Z8043 Family history of malignant neoplasm of testis: Secondary | ICD-10-CM | POA: Insufficient documentation

## 2017-02-20 DIAGNOSIS — Z79899 Other long term (current) drug therapy: Secondary | ICD-10-CM | POA: Insufficient documentation

## 2017-02-20 DIAGNOSIS — Z9851 Tubal ligation status: Secondary | ICD-10-CM | POA: Insufficient documentation

## 2017-02-20 DIAGNOSIS — Z8261 Family history of arthritis: Secondary | ICD-10-CM | POA: Diagnosis not present

## 2017-02-20 DIAGNOSIS — I4891 Unspecified atrial fibrillation: Secondary | ICD-10-CM | POA: Diagnosis not present

## 2017-02-20 DIAGNOSIS — K449 Diaphragmatic hernia without obstruction or gangrene: Secondary | ICD-10-CM | POA: Diagnosis not present

## 2017-02-20 DIAGNOSIS — Z808 Family history of malignant neoplasm of other organs or systems: Secondary | ICD-10-CM | POA: Insufficient documentation

## 2017-02-20 DIAGNOSIS — Z885 Allergy status to narcotic agent status: Secondary | ICD-10-CM | POA: Insufficient documentation

## 2017-02-20 DIAGNOSIS — Z801 Family history of malignant neoplasm of trachea, bronchus and lung: Secondary | ICD-10-CM | POA: Insufficient documentation

## 2017-02-20 DIAGNOSIS — K648 Other hemorrhoids: Secondary | ICD-10-CM | POA: Diagnosis not present

## 2017-02-20 DIAGNOSIS — M1990 Unspecified osteoarthritis, unspecified site: Secondary | ICD-10-CM | POA: Diagnosis not present

## 2017-02-20 DIAGNOSIS — D649 Anemia, unspecified: Secondary | ICD-10-CM | POA: Diagnosis not present

## 2017-02-20 HISTORY — DX: Irritable bowel syndrome, unspecified: K58.9

## 2017-02-20 HISTORY — DX: Acute embolism and thrombosis of unspecified deep veins of unspecified lower extremity: I82.409

## 2017-02-20 HISTORY — PX: COLONOSCOPY WITH PROPOFOL: SHX5780

## 2017-02-20 HISTORY — DX: Unspecified osteoarthritis, unspecified site: M19.90

## 2017-02-20 HISTORY — DX: Headache, unspecified: R51.9

## 2017-02-20 HISTORY — DX: Headache: R51

## 2017-02-20 HISTORY — DX: Cardiac arrhythmia, unspecified: I49.9

## 2017-02-20 HISTORY — DX: Other pulmonary embolism without acute cor pulmonale: I26.99

## 2017-02-20 SURGERY — COLONOSCOPY WITH PROPOFOL
Anesthesia: General

## 2017-02-20 MED ORDER — PROPOFOL 10 MG/ML IV BOLUS
INTRAVENOUS | Status: AC
  Filled 2017-02-20: qty 20

## 2017-02-20 MED ORDER — LIDOCAINE 2% (20 MG/ML) 5 ML SYRINGE
INTRAMUSCULAR | Status: DC | PRN
  Administered 2017-02-20: 40 mg via INTRAVENOUS

## 2017-02-20 MED ORDER — SODIUM CHLORIDE 0.9 % IV SOLN: INTRAVENOUS | Status: DC

## 2017-02-20 MED ORDER — PROPOFOL 500 MG/50ML IV EMUL
INTRAVENOUS | Status: AC
  Filled 2017-02-20: qty 50

## 2017-02-20 MED ORDER — LIDOCAINE HCL (PF) 2 % IJ SOLN
INTRAMUSCULAR | Status: AC
  Filled 2017-02-20: qty 10

## 2017-02-20 MED ORDER — PHENYLEPHRINE HCL 10 MG/ML IJ SOLN
INTRAMUSCULAR | Status: DC | PRN
  Administered 2017-02-20: 100 ug via INTRAVENOUS

## 2017-02-20 MED ORDER — SODIUM CHLORIDE 0.9 % IV SOLN
INTRAVENOUS | Status: DC
  Administered 2017-02-20 (×2): via INTRAVENOUS

## 2017-02-20 MED ORDER — PIPERACILLIN-TAZOBACTAM 3.375 G IVPB 30 MIN
3.3750 g | Freq: Once | INTRAVENOUS | Status: AC
  Administered 2017-02-20: 3.375 g via INTRAVENOUS
  Filled 2017-02-20: qty 50

## 2017-02-20 MED ORDER — PROPOFOL 10 MG/ML IV BOLUS
INTRAVENOUS | Status: DC | PRN
  Administered 2017-02-20: 100 mg via INTRAVENOUS

## 2017-02-20 MED ORDER — PIPERACILLIN-TAZOBACTAM 3.375 G IVPB 30 MIN
3.3750 g | Freq: Once | INTRAVENOUS | Status: DC
Start: 2017-02-21 — End: 2017-02-20

## 2017-02-20 MED ORDER — PROPOFOL 500 MG/50ML IV EMUL
INTRAVENOUS | Status: DC | PRN
  Administered 2017-02-20: 160 ug/kg/min via INTRAVENOUS

## 2017-02-20 MED ORDER — FENTANYL CITRATE (PF) 100 MCG/2ML IJ SOLN
INTRAMUSCULAR | Status: DC | PRN
  Administered 2017-02-20 (×2): 50 ug via INTRAVENOUS

## 2017-02-20 MED ORDER — FENTANYL CITRATE (PF) 100 MCG/2ML IJ SOLN
INTRAMUSCULAR | Status: AC
  Filled 2017-02-20: qty 2

## 2017-02-20 NOTE — Transfer of Care (Signed)
Immediate Anesthesia Transfer of Care Note  Patient: Tiffany Wells  Procedure(s) Performed: COLONOSCOPY WITH PROPOFOL (N/A )  Patient Location: PACU and Endoscopy Unit  Anesthesia Type:General  Level of Consciousness: awake and patient cooperative  Airway & Oxygen Therapy: Patient Spontanous Breathing and Patient connected to nasal cannula oxygen  Post-op Assessment: Report given to RN and Post -op Vital signs reviewed and stable  Post vital signs: Reviewed and stable  Last Vitals:  Vitals:   02/20/17 0907  BP: (!) 155/94  Pulse: 86  Resp: 16  Temp: 36.6 C  SpO2: 95%    Last Pain:  Vitals:   02/20/17 0907  TempSrc: Tympanic         Complications: No apparent anesthesia complications

## 2017-02-20 NOTE — Anesthesia Post-op Follow-up Note (Signed)
Anesthesia [facility] form completed.        

## 2017-02-20 NOTE — Anesthesia Preprocedure Evaluation (Addendum)
Anesthesia Evaluation  Patient identified by MRN, date of birth, ID band Patient awake    Reviewed: Allergy & Precautions, NPO status , Patient's Chart, lab work & pertinent test results  History of Anesthesia Complications Negative for: history of anesthetic complications  Airway Mallampati: III  TM Distance: >3 FB Neck ROM: Full    Dental no notable dental hx.    Pulmonary neg pulmonary ROS, neg sleep apnea, neg COPD,    breath sounds clear to auscultation- rhonchi (-) wheezing      Cardiovascular hypertension, Pt. on medications (-) CAD, (-) Past MI and (-) Cardiac Stents + dysrhythmias Supra Ventricular Tachycardia  Rhythm:Regular Rate:Normal - Systolic murmurs and - Diastolic murmurs    Neuro/Psych  Headaches, negative psych ROS   GI/Hepatic Neg liver ROS, hiatal hernia,   Endo/Other  negative endocrine ROSneg diabetes  Renal/GU negative Renal ROS     Musculoskeletal  (+) Arthritis ,   Abdominal (+) + obese,   Peds  Hematology  (+) anemia ,   Anesthesia Other Findings Past Medical History: No date: Arthritis     Comment:  osteoarthritis of knee No date: Back pain 12/28/2016: DVT (deep venous thrombosis) ([place]) 12/28/2016: Dysrhythmia     Comment:  atrial fibrillation, SVT No date: Headache     Comment:  Migraines 2012: History of Salmonella gastroenteritis No date: Hypertension No date: IBS (irritable bowel syndrome) No date: Internal hemorrhoids No date: Normal cardiac stress test     Comment:  2006, nromal stress cardiolite, EF 65% No date: Pulmonary embolism ([facility])     Comment:  post knee surgery   Reproductive/Obstetrics                             Anesthesia Physical Anesthesia Plan  ASA: III  Anesthesia Plan: General   Post-op Pain Management:    Induction: Intravenous  PONV Risk Score and Plan: 2 and Propofol infusion  Airway Management Planned: Natural  Airway  Additional Equipment:   Intra-op Plan:   Post-operative Plan:   Informed Consent: I have reviewed the patients History and Physical, chart, labs and discussed the procedure including the risks, benefits and alternatives for the proposed anesthesia with the patient or authorized representative who has indicated his/her understanding and acceptance.   Dental advisory given  Plan Discussed with: CRNA and Anesthesiologist  Anesthesia Plan Comments:        Anesthesia Quick Evaluation

## 2017-02-20 NOTE — Anesthesia Postprocedure Evaluation (Signed)
Anesthesia Post Note  Patient: Tiffany Wells  Procedure(s) Performed: COLONOSCOPY WITH PROPOFOL (N/A )  Patient location during evaluation: Endoscopy Anesthesia Type: General Level of consciousness: awake and alert and oriented Pain management: pain level controlled Vital Signs Assessment: post-procedure vital signs reviewed and stable Respiratory status: spontaneous breathing, nonlabored ventilation and respiratory function stable Cardiovascular status: blood pressure returned to baseline and stable Postop Assessment: no signs of nausea or vomiting Anesthetic complications: no     Last Vitals:  Vitals:   02/20/17 1100 02/20/17 1110  BP: 130/60 122/81  Pulse: 80 77  Resp: 13 12  Temp:    SpO2: 98% 99%    Last Pain:  Vitals:   02/20/17 1040  TempSrc: Tympanic                 [name]

## 2017-02-20 NOTE — Op Note (Signed)
[facility] Patient Name: Tiffany Wells Procedure Date: 02/20/2017 10:12 AM MRN: [id number] Account #: [id number] Date of Birth: Jan 04, 1956 Admit Type: Outpatient Age: 61 Room: [facility] ENDO ROOM 1 Gender: Female Note Status: Finalized Procedure:            Colonoscopy Indications:          Heme positive stool Providers:            Manya Silvas, MD Referring MD:         Deborra Medina, MD (Referring MD) Medicines:            Propofol per Anesthesia Complications:        No immediate complications. Procedure:            Pre-Anesthesia Assessment:                       - After reviewing the risks and benefits, the patient                        was deemed in satisfactory condition to undergo the                        procedure.                       After obtaining informed consent, the colonoscope was                        passed under direct vision. Throughout the procedure,                        the patient's blood pressure, pulse, and oxygen                        saturations were monitored continuously. The                        Colonoscope was introduced through the anus and                        advanced to the the cecum, identified by appendiceal                        orifice and ileocecal valve. The colonoscopy was                        somewhat difficult due to a tortuous colon. Successful                        completion of the procedure was aided by changing to a                        pediatric colon endoscopes. The patient tolerated the                        procedure well. The quality of the bowel preparation                        was good. Findings:      Internal hemorrhoids were found during endoscopy. The hemorrhoids were  small, medium-sized and Grade I (internal hemorrhoids that do not       prolapse).      The exam was otherwise without abnormality. Impression:           - Internal hemorrhoids.        - The examination was otherwise normal.                       - No specimens collected. Recommendation:       - The findings and recommendations were discussed with                        the patient's family. Manya Silvas, MD 02/20/2017 10:44:13 AM This report has been signed electronically. Number of Addenda: 0 Note Initiated On: 02/20/2017 10:12 AM Scope Withdrawal Time: 0 hours 7 minutes 19 seconds  Total Procedure Duration: 0 hours 22 minutes 41 seconds       [facility]

## 2017-02-20 NOTE — H&P (Signed)
Primary Care Physician:  Crecencio Mc, MD Primary Gastroenterologist:  Dr. Vira Agar  Pre-Procedure History & Physical: HPI:  Tiffany Wells is a 61 y.o. female is here for an colonoscopy.   Past Medical History:  Diagnosis Date  . Arthritis    osteoarthritis of knee  . Back pain   . DVT (deep venous thrombosis) ([place]) 12/28/2016  . Dysrhythmia 12/28/2016   atrial fibrillation, SVT  . Headache    Migraines  . History of Salmonella gastroenteritis 2012  . Hypertension   . IBS (irritable bowel syndrome)   . Internal hemorrhoids   . Normal cardiac stress test    2006, nromal stress cardiolite, EF 65%  . Pulmonary embolism ([place])    post knee surgery    Past Surgical History:  Procedure Laterality Date  . COLONOSCOPY  03/07/2011   Int Hemms  . HYSTEROSCOPY    . JOINT REPLACEMENT     Bilateral TKR  . ROTATOR CUFF REPAIR    . TUBAL LIGATION    . UPPER GASTROINTESTINAL ENDOSCOPY  03/07/2011   [facility] Multip Gastric Polyps/ Dr Vira Agar    Prior to Admission medications   Medication Sig Start Date End Date Taking? Authorizing Provider  cyclobenzaprine (FLEXERIL) 10 MG tablet TAKE 1/2 TO 1 TABLET BY MOUTH 3 TIMES A DAY AS NEEDED FOR BACK PAIN 01/11/16  Yes Crecencio Mc, MD  dicyclomine (BENTYL) 10 MG capsule Take 10 mg by mouth 2 (two) times daily.   Yes [provider]  diltiazem (CARDIZEM) 30 MG tablet Take 30 mg by mouth 4 (four) times daily.   Yes [provider]  diltiazem (CARTIA XT) 240 MG 24 hr capsule Take 240 mg by mouth daily.   Yes [provider]  diphenhydrAMINE (BENADRYL) 25 MG tablet As needed for allergies.    Yes [provider]  estradiol (ESTRACE) 0.1 MG/GM vaginal cream PLACE 1/4 APPLICATORFUL VAGINALLY DAILY. 12/05/16  Yes Crecencio Mc, MD  hyoscyamine (LEVSIN SL) 0.125 MG SL tablet Place 0.125 mg under the tongue every 4 (four) hours as needed for cramping.   Yes [provider]  metoprolol succinate  (TOPROL-XL) 25 MG 24 hr tablet Take 3 tablets (75 mg total) by mouth daily. Patient taking differently: Take 37.5 mg by mouth 2 (two) times daily. Take 1.5 tablets twice daily 10/13/15  Yes Crecencio Mc, MD  omeprazole (PRILOSEC OTC) 20 MG tablet Take 20 mg by mouth daily.     Yes [provider]  diphenoxylate-atropine (LOMOTIL) 2.5-0.025 MG tablet TAKE 1 TABLET BY MOUTH AS NEEDED 09/20/16   Crecencio Mc, MD    Allergies as of 01/30/2017 - Review Complete 06/29/2016  Allergen Reaction Noted  . Codeine sulfate    . Sulfa antibiotics  02/03/2014    Family History  Problem Relation Age of Onset  . Hypertension Mother   . Diabetes Mother   . Cancer Mother        glioma  . Hypertension Father   . Heart disease Father        CAD with CABG  . Cancer Father        multiple myeloma  . Arthritis Sister   . Hypertension Sister   . Cancer Brother        Pre-cancerous lip lesion, testicular CA   . Cancer Maternal Grandmother        Lung Ca, nonsmoker     Social History   Socioeconomic History  . Marital status: Married  Spouse name: Not on file  . Number of children: 2  . Years of education: Not on file  . Highest education level: Not on file  Social Needs  . Financial resource strain: Not on file  . Food insecurity - worry: Not on file  . Food insecurity - inability: Not on file  . Transportation needs - medical: Not on file  . Transportation needs - non-medical: Not on file  Occupational History  . Occupation: Glass blower/designer, [employer]  Tobacco Use  . Smoking status: Never Smoker  . Smokeless tobacco: Never Used  Substance and Sexual Activity  . Alcohol use: Yes    Alcohol/week: 1.2 oz    Types: 2 Glasses of wine per week    Comment: very rare  . Drug use: No  . Sexual activity: Not on file  Other Topics Concern  . Not on file  Social History Narrative   Lives with husband.    Review of Systems: See HPI, otherwise negative ROS  Physical  Exam: BP (!) 155/94   Pulse 86   Temp 97.9 F (36.6 C) (Tympanic)   Resp 16   Ht '5\' 8"'  (1.727 m)   Wt 134.3 kg (296 lb)   SpO2 95%   BMI 45.01 kg/m  General:   Alert,  pleasant and cooperative in NAD Head:  Normocephalic and atraumatic. Neck:  Supple; no masses or thyromegaly. Lungs:  Clear throughout to auscultation.    Heart:  Regular rate and rhythm. Abdomen:  Soft, nontender and nondistended. Normal bowel sounds, without guarding, and without rebound.   Neurologic:  Alert and  oriented x4;  grossly normal neurologically.  Impression/Plan: Tiffany Wells is here for an colonoscopy to be performed for heme positive stool.  Risks, benefits, limitations, and alternatives regarding  colonoscopy have been reviewed with the patient.  Questions have been answered.  All parties agreeable.   Gaylyn Cheers, MD  02/20/2017, 10:07 AM

## 2017-04-17 ENCOUNTER — Other Ambulatory Visit: Payer: Self-pay | Admitting: Internal Medicine

## 2017-04-17 MED ORDER — HYDROCOD POLST-CPM POLST ER 10-8 MG/5ML PO SUER: 5.0000 mL | Freq: Two times a day (BID) | ORAL | 0 refills | Status: DC | PRN

## 2017-04-17 MED ORDER — PREDNISONE 10 MG PO TABS: ORAL_TABLET | ORAL | 0 refills | Status: DC

## 2017-04-19 ENCOUNTER — Other Ambulatory Visit: Payer: Self-pay | Admitting: Internal Medicine

## 2017-04-19 ENCOUNTER — Encounter: Payer: Self-pay | Admitting: Internal Medicine

## 2017-04-19 MED ORDER — AMOXICILLIN-POT CLAVULANATE 875-125 MG PO TABS: 1.0000 | ORAL_TABLET | Freq: Two times a day (BID) | ORAL | 0 refills | Status: DC

## 2017-05-02 ENCOUNTER — Encounter: Payer: Self-pay | Admitting: Internal Medicine

## 2017-05-02 ENCOUNTER — Ambulatory Visit (INDEPENDENT_AMBULATORY_CARE_PROVIDER_SITE_OTHER): Payer: 59

## 2017-05-02 ENCOUNTER — Other Ambulatory Visit: Payer: Self-pay | Admitting: Internal Medicine

## 2017-05-02 ENCOUNTER — Ambulatory Visit (INDEPENDENT_AMBULATORY_CARE_PROVIDER_SITE_OTHER): Payer: 59 | Admitting: Internal Medicine

## 2017-05-02 DIAGNOSIS — J329 Chronic sinusitis, unspecified: Secondary | ICD-10-CM

## 2017-05-02 DIAGNOSIS — R059 Cough, unspecified: Secondary | ICD-10-CM

## 2017-05-02 DIAGNOSIS — R05 Cough: Secondary | ICD-10-CM

## 2017-05-02 MED ORDER — DOXYCYCLINE HYCLATE 100 MG PO TABS: 100.0000 mg | ORAL_TABLET | Freq: Two times a day (BID) | ORAL | 0 refills | Status: DC

## 2017-05-02 MED ORDER — HYDROCOD POLST-CPM POLST ER 10-8 MG/5ML PO SUER: 5.0000 mL | Freq: Two times a day (BID) | ORAL | 0 refills | Status: DC | PRN

## 2017-05-02 MED ORDER — AZITHROMYCIN 500 MG PO TABS: 500.0000 mg | ORAL_TABLET | Freq: Every day | ORAL | 0 refills | Status: DC

## 2017-05-02 MED ORDER — PREDNISONE 10 MG PO TABS: ORAL_TABLET | ORAL | 0 refills | Status: DC

## 2017-05-02 NOTE — Progress Notes (Signed)
chest

## 2017-05-02 NOTE — Progress Notes (Signed)
Subjective:  Patient ID: Tiffany Wells, female    DOB: [birth year]  Age: 62 y.o. MRN: [id number]  CC: The encounter diagnosis was Recurrent sinusitis.  HPI Tiffany Wells presents for recurrence of cough and malaise, sore throat and right ear pressure .   Patient was treated in late December for bronchitis with augmentin ,  Prednisone taper and cough suppression,.  Improved significantly,  But  symptoms of malaise, sore throat on the right , and fatigue ,  Dyspnea and worsening cough returned 3 days ago.     Outpatient Medications Prior to Visit  Medication Sig Dispense Refill  . cyclobenzaprine (FLEXERIL) 10 MG tablet TAKE 1/2 TO 1 TABLET BY MOUTH 3 TIMES A DAY AS NEEDED FOR BACK PAIN 180 tablet 2  . dicyclomine (BENTYL) 10 MG capsule Take 10 mg by mouth daily.     [name] diltiazem (CARDIZEM) 30 MG tablet Take 30 mg by mouth 4 (four) times daily.    [name] diltiazem (CARTIA XT) 240 MG 24 hr capsule Take 240 mg by mouth daily.    . diphenhydrAMINE (BENADRYL) 25 MG tablet As needed for allergies.     . diphenoxylate-atropine (LOMOTIL) 2.5-0.025 MG tablet TAKE 1 TABLET BY MOUTH AS NEEDED 30 tablet 2  . estradiol (ESTRACE) 0.1 MG/GM vaginal cream PLACE 1/4 APPLICATORFUL VAGINALLY DAILY. 42.5 g 11  . hyoscyamine (LEVSIN SL) 0.125 MG SL tablet Place 0.125 mg under the tongue every 4 (four) hours as needed for cramping.    . metoprolol succinate (TOPROL-XL) 25 MG 24 hr tablet Take 3 tablets (75 mg total) by mouth daily. (Patient taking differently: Take 37.5 mg by mouth 2 (two) times daily. Take 1.5 tablets twice daily) 90 tablet 11  . omeprazole (PRILOSEC OTC) 20 MG tablet Take 20 mg by mouth daily.      . chlorpheniramine-HYDROcodone (TUSSIONEX PENNKINETIC ER) 10-8 MG/5ML SUER Take 5 mLs by mouth every 12 (twelve) hours as needed for cough. 280 mL 0  . amoxicillin-clavulanate (AUGMENTIN) 875-125 MG tablet Take 1 tablet by mouth 2 (two) times daily. (Patient not taking: Reported on 05/02/2017) 14  tablet 0  . predniSONE (DELTASONE) 10 MG tablet 6 tablets on Day 1 , then reduce by 1 tablet daily until gone (Patient not taking: Reported on 05/02/2017) 21 tablet 0   No facility-administered medications prior to visit.     Review of Systems;  Patient denies, fevers, malaise, unintentional weight loss, skin rash, eye pain,  sore throat, dysphagia,  hemoptysis , , wheezing, chest pain, palpitations, orthopnea, edema, abdominal pain, nausea, melena, diarrhea, constipation, flank pain, dysuria, hematuria, urinary  Frequency, nocturia, numbness, tingling, seizures,  Focal weakness, Loss of consciousness,  Tremor, insomnia, depression, anxiety, and suicidal ideation.      Objective:  BP 118/72 (BP Location: Left Arm, Patient Position: Sitting, Cuff Size: Large)   Pulse (!) 101   Temp 98.1 F (36.7 C) (Oral)   Resp 17   Ht 5\' 8"  (1.727 m)   Wt 298 lb 3.2 oz (135.3 kg)   SpO2 96%   BMI 45.34 kg/m   BP Readings from Last 3 Encounters:  05/02/17 118/72  02/20/17 122/81  06/29/16 126/82    Wt Readings from Last 3 Encounters:  05/02/17 298 lb 3.2 oz (135.3 kg)  02/20/17 296 lb (134.3 kg)  06/29/16 296 lb 3.2 oz (134.4 kg)    General appearance: alert, cooperative and appears stated age Ears: normal TM's and external ear canals both ears Throat: lips, mucosa, and  tongue normal; teeth and gums normal Neck: no adenopathy, no carotid bruit, supple, symmetrical, trachea midline and thyroid not enlarged, symmetric, no tenderness/mass/nodules Back: symmetric, no curvature. ROM normal. No CVA tenderness. Lungs: clear to auscultation bilaterally Heart: regular rate and rhythm, S1, S2 normal, no murmur, click, rub or gallop Abdomen: soft, non-tender; bowel sounds normal; no masses,  no organomegaly Pulses: 2+ and symmetric Skin: Skin color, texture, turgor normal. No rashes or lesions Lymph nodes: Cervical, supraclavicular, and axillary nodes normal.  Lab Results  Component Value Date    HGBA1C 5.9 10/12/2015    Lab Results  Component Value Date   CREATININE 0.83 10/12/2015   CREATININE 0.88 02/15/2014   CREATININE 0.79 02/13/2014   CREATININE 0.81 02/13/2014    Lab Results  Component Value Date   WBC 14.3 (H) 10/12/2015   HGB 13.5 10/12/2015   HCT 40.8 10/12/2015   PLT 240.0 10/12/2015   GLUCOSE 93 10/12/2015   CHOL 199 05/28/2013   TRIG 198.0 (H) 05/28/2013   HDL 33.40 (L) 05/28/2013   LDLDIRECT 106.0 10/12/2015   LDLCALC 126 (H) 05/28/2013   ALT 14 10/12/2015   AST 13 10/12/2015   NA 141 10/12/2015   K 3.9 10/12/2015   CL 103 10/12/2015   CREATININE 0.83 10/12/2015   BUN 13 10/12/2015   CO2 32 10/12/2015   TSH 3.66 10/12/2015   INR 1.1 02/13/2014   HGBA1C 5.9 10/12/2015    No results found.  Assessment & Plan:   Problem List Items Addressed This Visit    Recurrent sinusitis    Treated initially with augmentin,  But symptoms recurred after less than 2 weeks .  Treating with azithromycin and doxycycline , prednisone taper and cough suppressant.  Chest x ray normal       Relevant Medications   azithromycin (ZITHROMAX) 500 MG tablet   doxycycline (VIBRA-TABS) 100 MG tablet   predniSONE (DELTASONE) 10 MG tablet   chlorpheniramine-HYDROcodone (TUSSIONEX PENNKINETIC ER) 10-8 MG/5ML SUER      I have discontinued Kyera C. Latin's predniSONE and amoxicillin-clavulanate. I am also having her start on azithromycin, doxycycline, and predniSONE. Additionally, I am having her maintain her diphenhydrAMINE, omeprazole, metoprolol succinate, cyclobenzaprine, diphenoxylate-atropine, estradiol, dicyclomine, diltiazem, diltiazem, hyoscyamine, and chlorpheniramine-HYDROcodone.  Meds ordered this encounter  Medications  . azithromycin (ZITHROMAX) 500 MG tablet    Sig: Take 1 tablet (500 mg total) by mouth daily.    Dispense:  7 tablet    Refill:  0  . doxycycline (VIBRA-TABS) 100 MG tablet    Sig: Take 1 tablet (100 mg total) by mouth 2 (two) times  daily.    Dispense:  14 tablet    Refill:  0  . predniSONE (DELTASONE) 10 MG tablet    Sig: 6 tablets daily for 3 days ,  then reduce by 1 tablet daily until gone    Dispense:  33 tablet    Refill:  0  . chlorpheniramine-HYDROcodone (TUSSIONEX PENNKINETIC ER) 10-8 MG/5ML SUER    Sig: Take 5 mLs by mouth every 12 (twelve) hours as needed for cough.    Dispense:  280 mL    Refill:  0    Medications Discontinued During This Encounter  Medication Reason  . amoxicillin-clavulanate (AUGMENTIN) 875-125 MG tablet Completed Course  . predniSONE (DELTASONE) 10 MG tablet Completed Course  . chlorpheniramine-HYDROcodone (TUSSIONEX PENNKINETIC ER) 10-8 MG/5ML SUER Reorder    A total of 25 minutes of face to face time was spent with patient more than half of  which was spent in counselling about the above mentioned conditions  and coordination of care   Follow-up: No Follow-up on file.   Crecencio Mc, MD

## 2017-05-03 ENCOUNTER — Encounter: Payer: Self-pay | Admitting: Internal Medicine

## 2017-05-03 DIAGNOSIS — J329 Chronic sinusitis, unspecified: Secondary | ICD-10-CM | POA: Insufficient documentation

## 2017-05-03 NOTE — Assessment & Plan Note (Signed)
Treated initially with augmentin,  But symptoms recurred after less than 2 weeks .  Treating with azithromycin and doxycycline , prednisone taper and cough suppressant.  Chest x ray normal

## 2017-05-11 ENCOUNTER — Ambulatory Visit (INDEPENDENT_AMBULATORY_CARE_PROVIDER_SITE_OTHER): Payer: 59 | Admitting: Internal Medicine

## 2017-05-11 ENCOUNTER — Encounter: Payer: Self-pay | Admitting: Internal Medicine

## 2017-05-11 VITALS — BP 116/62 | HR 87 | Temp 97.8°F | Resp 15 | Ht 68.0 in | Wt 298.6 lb

## 2017-05-11 DIAGNOSIS — Z0000 Encounter for general adult medical examination without abnormal findings: Secondary | ICD-10-CM

## 2017-05-11 DIAGNOSIS — R7303 Prediabetes: Secondary | ICD-10-CM | POA: Diagnosis not present

## 2017-05-11 DIAGNOSIS — K449 Diaphragmatic hernia without obstruction or gangrene: Secondary | ICD-10-CM

## 2017-05-11 DIAGNOSIS — E559 Vitamin D deficiency, unspecified: Secondary | ICD-10-CM

## 2017-05-11 DIAGNOSIS — R5383 Other fatigue: Secondary | ICD-10-CM | POA: Diagnosis not present

## 2017-05-11 NOTE — Progress Notes (Signed)
Patient ID: Tiffany Wells, female    DOB: 20-Oct-1955  Age: 62 y.o. MRN: [id number]  The patient is here for annual preventive  examination and management of other chronic and acute problems.  Sees GYN for PAP smears last one 2017 [facility] : ASCUS  HPV neg  Sees [facility] for cardiology follow up on atrial fib/flutter  Needs Hep c and HIV screening Mammogram overdue The risk factors are reflected in the social history.  The roster of all physicians providing medical care to patient - is listed in the Snapshot section of the chart.  Activities of daily living:  The patient is 100% independent in all ADLs: dressing, toileting, feeding as well as independent mobility  Home safety : The patient has smoke detectors in the home. They wear seatbelts.  There are no firearms at home. There is no violence in the home.   There is no risks for hepatitis, STDs or HIV. There is no   history of blood transfusion. They have no travel history to infectious disease endemic areas of the world.  The patient has seen their dentist in the last six month. They have seen their eye doctor in the last year. They admit to slight hearing difficulty with regard to whispered voices and some television programs.  They have deferred audiologic testing in the last year.  They do not  have excessive sun exposure. Discussed the need for sun protection: hats, long sleeves and use of sunscreen if there is significant sun exposure.   Diet: the importance of a healthy diet is discussed. They do have a healthy diet.  The benefits of regular aerobic exercise were discussed. She walks 4 times per week ,  20 minutes.   Depression screen: there are no signs or vegative symptoms of depression- irritability, change in appetite, anhedonia, sadness/tearfullness.  Cognitive assessment: the patient manages all their financial and personal affairs and is actively engaged. They could relate day,date,year and events; recalled 2/3 objects at 3  minutes; performed clock-face test normally.  The following portions of the patient's history were reviewed and updated as appropriate: allergies, current medications, past family history, past medical history,  past surgical history, past social history  and problem list.  Visual acuity was not assessed per patient preference since she has regular follow up with her ophthalmologist. Hearing and body mass index were assessed and reviewed.   During the course of the visit the patient was educated and counseled about appropriate screening and preventive services including : fall prevention , diabetes screening, nutrition counseling, colorectal cancer screening, and recommended immunizations.    CC: The primary encounter diagnosis was Prediabetes. Diagnoses of Fatigue, unspecified type, Vitamin D deficiency, Hiatal hernia, and Encounter for preventive health examination were also pertinent to this visit.  No acute issues.  Treated with antibiotics for sinusitis/bronchitis and notes steady improvemement in symptoms .  GI evaluation in  Sept 2018 for rectal bleeding . colonoscopy Nov 2018  Normal , no coliits,  Int hemorrhoids seen.  She was  advised to limit lomotil use by GI  And has been using  bentyl less often.  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 Personal best was 258 in 2015.  She has a small appetite and is frustrated with her inability to move the scale despite having a careful diet.   History Tiffany Wells has a past medical history of Acute pulmonary embolism ([employer]) (08/06/2013), Arthritis, Back pain, DVT (deep venous thrombosis) ([place]) (12/28/2016), Dysrhythmia (12/28/2016), Headache, History of Salmonella  gastroenteritis (2012), Hypertension, IBS (irritable bowel syndrome), Internal hemorrhoids, Ischemic colitis ([place]) (02/21/2014), Normal cardiac stress test, and Pulmonary embolism ([place]).   She has a past surgical history that includes Rotator cuff repair; Tubal ligation; Colonoscopy (03/07/2011); Hysteroscopy; Upper gastrointestinal endoscopy (03/07/2011); Joint replacement; and Colonoscopy with propofol (N/A, 02/20/2017).   Her family history includes Arthritis in her sister; Cancer in her brother, father, maternal grandmother, and mother; Diabetes in her mother; Heart disease in her father; Hypertension in her father, mother, and sister.She reports that  has never smoked. she has never used smokeless tobacco. She reports that she drinks about 1.2 oz of alcohol per week. She reports that she does not use drugs.  Outpatient Medications Prior to Visit  Medication Sig Dispense Refill  . cyclobenzaprine (FLEXERIL) 10 MG tablet TAKE 1/2 TO 1 TABLET BY MOUTH 3 TIMES A DAY AS NEEDED FOR BACK PAIN 180 tablet 2  . dicyclomine (BENTYL) 10 MG capsule Take 10 mg by mouth daily.     [name] diltiazem (CARDIZEM) 30 MG tablet Take 30 mg by mouth 4 (four) times daily.    [name] diltiazem (CARTIA XT) 240 MG 24 hr capsule Take 240 mg by mouth daily.    . diphenhydrAMINE (BENADRYL) 25 MG tablet As needed for allergies.     . diphenoxylate-atropine (LOMOTIL) 2.5-0.025 MG tablet TAKE 1 TABLET BY MOUTH AS NEEDED 30 tablet 2  . estradiol (ESTRACE) 0.1 MG/GM vaginal cream PLACE 1/4 APPLICATORFUL VAGINALLY DAILY. 42.5 g 11  . hyoscyamine (LEVSIN SL) 0.125 MG SL tablet Place 0.125 mg under the tongue every 4 (four) hours as needed for cramping.    . metoprolol succinate (TOPROL-XL) 25 MG 24 hr tablet Take 3 tablets (75 mg total) by mouth daily. (Patient taking differently: Take 37.5 mg by mouth 2 (two) times daily. Take 1.5 tablets twice daily) 90 tablet 11  . omeprazole (PRILOSEC OTC) 20 MG tablet Take 20 mg by mouth daily.      .  chlorpheniramine-HYDROcodone (TUSSIONEX PENNKINETIC ER) 10-8 MG/5ML SUER Take 5 mLs by mouth every 12 (twelve) hours as needed for cough. (Patient not taking: Reported on 05/11/2017) 280 mL 0  . azithromycin (ZITHROMAX) 500 MG tablet Take 1 tablet (500 mg total) by mouth daily. (Patient not taking: Reported on 05/11/2017) 7 tablet 0  . doxycycline (VIBRA-TABS) 100 MG tablet Take 1 tablet (100 mg total) by mouth 2 (two) times daily. (Patient not taking: Reported on 05/11/2017) 14 tablet 0  . predniSONE (DELTASONE) 10 MG tablet 6 tablets daily for 3 days ,  then reduce by 1 tablet daily until gone (Patient not taking: Reported on 05/11/2017) 33 tablet 0   No facility-administered medications prior to visit.     Review of Systems   Patient denies headache, fevers, malaise, unintentional weight loss, skin rash, eye pain, sinus congestion and sinus pain, sore throat, dysphagia,  hemoptysis , cough, dyspnea, wheezing, chest pain, palpitations, orthopnea, edema, abdominal pain, nausea, melena, diarrhea, constipation, flank pain, dysuria, hematuria, urinary  Frequency, nocturia, numbness, tingling, seizures,  Focal weakness, Loss of consciousness,  Tremor, insomnia, depression, anxiety, and suicidal ideation.      Objective:  BP 116/62 (BP Location: Left Arm, Patient Position: Sitting, Cuff  Size: Large)   Pulse 87   Temp 97.8 F (36.6 C) (Oral)   Resp 15   Ht 5\' 8"  (1.727 m)   Wt 298 lb 9.6 oz (135.4 kg)   SpO2 97%   BMI 45.40 kg/m   Physical Exam   General appearance: alert, cooperative and appears stated age Ears: normal TM's and external ear canals both ears Throat: lips, mucosa, and tongue normal; teeth and gums normal Neck: no adenopathy, no carotid bruit, supple, symmetrical, trachea midline and thyroid not enlarged, symmetric, no tenderness/mass/nodules Back: symmetric, no curvature. ROM normal. No CVA tenderness. Lungs: clear to auscultation bilaterally Heart: regular rate and rhythm,  S1, S2 normal, no murmur, click, rub or gallop Abdomen: soft, non-tender; bowel sounds normal; no masses,  no organomegaly Pulses: 2+ and symmetric Skin: Skin color, texture, turgor normal. No rashes or lesions Lymph nodes: Cervical, supraclavicular, and axillary nodes normal.   Assessment & Plan:   Problem List Items Addressed This Visit    Encounter for preventive health examination    Annual comprehensive preventive exam was done as well as an evaluation and management of chronic conditions .  During the course of the visit the patient was educated and counseled about appropriate screening and preventive services including :  diabetes screening, lipid analysis with projected  10 year  risk for CAD , nutrition counseling, breast, cervical and colorectal cancer screening, and recommended immunizations.  Printed recommendations for health maintenance screenings was given.  Mammogram and DEXA scan will be ordered once she decides where to have them done.       Hiatal hernia    Managed with careful diet.  No surgical intervention planned.        Other Visit Diagnoses    Prediabetes    -  Primary   Relevant Orders   Comprehensive metabolic panel   Hemoglobin A1c   Lipid panel   Fatigue, unspecified type       Relevant Orders   CBC with Differential/Platelet   TSH   Vitamin D deficiency       Relevant Orders   VITAMIN D 25 Hydroxy (Vit-D Deficiency, Fractures)      I have discontinued Cloey C. Malenfant's azithromycin, doxycycline, and predniSONE. I am also having her maintain her diphenhydrAMINE, omeprazole, metoprolol succinate, cyclobenzaprine, diphenoxylate-atropine, estradiol, dicyclomine, diltiazem, diltiazem, hyoscyamine, and chlorpheniramine-HYDROcodone.  No orders of the defined types were placed in this encounter.   Medications Discontinued During This Encounter  Medication Reason  . azithromycin (ZITHROMAX) 500 MG tablet Completed Course  . doxycycline (VIBRA-TABS)  100 MG tablet Completed Course  . predniSONE (DELTASONE) 10 MG tablet Completed Course    Follow-up: Return in about 6 months (around 11/08/2017), or fasting labs needed  asac.   Crecencio Mc, MD

## 2017-05-11 NOTE — Patient Instructions (Addendum)
Let me know where you want to have your mammogram and I'll order it and the DEXA scan  You have osteopenia by 2011 DEXA,  So 1200 mg calcium intake is advised   Health Maintenance for Postmenopausal Women Menopause is a normal process in which your reproductive ability comes to an end. This process happens gradually over a span of months to years, usually between the ages of 10 and 20. Menopause is complete when you have missed 12 consecutive menstrual periods. It is important to talk with your health care provider about some of the most common conditions that affect postmenopausal women, such as heart disease, cancer, and bone loss (osteoporosis). Adopting a healthy lifestyle and getting preventive care can help to promote your health and wellness. Those actions can also lower your chances of developing some of these common conditions. What should I know about menopause? During menopause, you may experience a number of symptoms, such as:  Moderate-to-severe hot flashes.  Night sweats.  Decrease in sex drive.  Mood swings.  Headaches.  Tiredness.  Irritability.  Memory problems.  Insomnia.  Choosing to treat or not to treat menopausal changes is an individual decision that you make with your health care provider. What should I know about hormone replacement therapy and supplements? Hormone therapy products are effective for treating symptoms that are associated with menopause, such as hot flashes and night sweats. Hormone replacement carries certain risks, especially as you become older. If you are thinking about using estrogen or estrogen with progestin treatments, discuss the benefits and risks with your health care provider. What should I know about heart disease and stroke? Heart disease, heart attack, and stroke become more likely as you age. This may be due, in part, to the hormonal changes that your body experiences during menopause. These can affect how your body processes  dietary fats, triglycerides, and cholesterol. Heart attack and stroke are both medical emergencies. There are many things that you can do to help prevent heart disease and stroke:  Have your blood pressure checked at least every 1-2 years. High blood pressure causes heart disease and increases the risk of stroke.  If you are 14-65 years old, ask your health care provider if you should take aspirin to prevent a heart attack or a stroke.  Do not use any tobacco products, including cigarettes, chewing tobacco, or electronic cigarettes. If you need help quitting, ask your health care provider.  It is important to eat a healthy diet and maintain a healthy weight. ? Be sure to include plenty of vegetables, fruits, low-fat dairy products, and lean protein. ? Avoid eating foods that are high in solid fats, added sugars, or salt (sodium).  Get regular exercise. This is one of the most important things that you can do for your health. ? Try to exercise for at least 150 minutes each week. The type of exercise that you do should increase your heart rate and make you sweat. This is known as moderate-intensity exercise. ? Try to do strengthening exercises at least twice each week. Do these in addition to the moderate-intensity exercise.  Know your numbers.Ask your health care provider to check your cholesterol and your blood glucose. Continue to have your blood tested as directed by your health care provider.  What should I know about cancer screening? There are several types of cancer. Take the following steps to reduce your risk and to catch any cancer development as early as possible. Breast Cancer  Practice breast self-awareness. ? This  means understanding how your breasts normally appear and feel. ? It also means doing regular breast self-exams. Let your health care provider know about any changes, no matter how small.  If you are 24 or older, have a clinician do a breast exam (clinical breast  exam or CBE) every year. Depending on your age, family history, and medical history, it may be recommended that you also have a yearly breast X-ray (mammogram).  If you have a family history of breast cancer, talk with your health care provider about genetic screening.  If you are at high risk for breast cancer, talk with your health care provider about having an MRI and a mammogram every year.  Breast cancer (BRCA) gene test is recommended for women who have family members with BRCA-related cancers. Results of the assessment will determine the need for genetic counseling and BRCA1 and for BRCA2 testing. BRCA-related cancers include these types: ? Breast. This occurs in males or females. ? Ovarian. ? Tubal. This may also be called fallopian tube cancer. ? Cancer of the abdominal or pelvic lining (peritoneal cancer). ? Prostate. ? Pancreatic.  Cervical, Uterine, and Ovarian Cancer Your health care provider may recommend that you be screened regularly for cancer of the pelvic organs. These include your ovaries, uterus, and vagina. This screening involves a pelvic exam, which includes checking for microscopic changes to the surface of your cervix (Pap test).  For women ages 21-65, health care providers may recommend a pelvic exam and a Pap test every three years. For women ages 10-65, they may recommend the Pap test and pelvic exam, combined with testing for human papilloma virus (HPV), every five years. Some types of HPV increase your risk of cervical cancer. Testing for HPV may also be done on women of any age who have unclear Pap test results.  Other health care providers may not recommend any screening for nonpregnant women who are considered low risk for pelvic cancer and have no symptoms. Ask your health care provider if a screening pelvic exam is right for you.  If you have had past treatment for cervical cancer or a condition that could lead to cancer, you need Pap tests and screening for  cancer for at least 20 years after your treatment. If Pap tests have been discontinued for you, your risk factors (such as having a new sexual partner) need to be reassessed to determine if you should start having screenings again. Some women have medical problems that increase the chance of getting cervical cancer. In these cases, your health care provider may recommend that you have screening and Pap tests more often.  If you have a family history of uterine cancer or ovarian cancer, talk with your health care provider about genetic screening.  If you have vaginal bleeding after reaching menopause, tell your health care provider.  There are currently no reliable tests available to screen for ovarian cancer.  Lung Cancer Lung cancer screening is recommended for adults 89-37 years old who are at high risk for lung cancer because of a history of smoking. A yearly low-dose CT scan of the lungs is recommended if you:  Currently smoke.  Have a history of at least 30 pack-years of smoking and you currently smoke or have quit within the past 15 years. A pack-year is smoking an average of one pack of cigarettes per day for one year.  Yearly screening should:  Continue until it has been 15 years since you quit.  Stop if you develop a  health problem that would prevent you from having lung cancer treatment.  Colorectal Cancer  This type of cancer can be detected and can often be prevented.  Routine colorectal cancer screening usually begins at age 24 and continues through age 17.  If you have risk factors for colon cancer, your health care provider may recommend that you be screened at an earlier age.  If you have a family history of colorectal cancer, talk with your health care provider about genetic screening.  Your health care provider may also recommend using home test kits to check for hidden blood in your stool.  A small camera at the end of a tube can be used to examine your colon  directly (sigmoidoscopy or colonoscopy). This is done to check for the earliest forms of colorectal cancer.  Direct examination of the colon should be repeated every 5-10 years until age 46. However, if early forms of precancerous polyps or small growths are found or if you have a family history or genetic risk for colorectal cancer, you may need to be screened more often.  Skin Cancer  Check your skin from head to toe regularly.  Monitor any moles. Be sure to tell your health care provider: ? About any new moles or changes in moles, especially if there is a change in a mole's shape or color. ? If you have a mole that is larger than the size of a pencil eraser.  If any of your family members has a history of skin cancer, especially at a young age, talk with your health care provider about genetic screening.  Always use sunscreen. Apply sunscreen liberally and repeatedly throughout the day.  Whenever you are outside, protect yourself by wearing long sleeves, pants, a wide-brimmed hat, and sunglasses.  What should I know about osteoporosis? Osteoporosis is a condition in which bone destruction happens more quickly than new bone creation. After menopause, you may be at an increased risk for osteoporosis. To help prevent osteoporosis or the bone fractures that can happen because of osteoporosis, the following is recommended:  If you are 56-48 years old, get at least 1,000 mg of calcium and at least 600 mg of vitamin D per day.  If you are older than age 44 but younger than age 24, get at least 1,200 mg of calcium and at least 600 mg of vitamin D per day.  If you are older than age 72, get at least 1,200 mg of calcium and at least 800 mg of vitamin D per day.  Smoking and excessive alcohol intake increase the risk of osteoporosis. Eat foods that are rich in calcium and vitamin D, and do weight-bearing exercises several times each week as directed by your health care provider. What should I  know about how menopause affects my mental health? Depression may occur at any age, but it is more common as you become older. Common symptoms of depression include:  Low or sad mood.  Changes in sleep patterns.  Changes in appetite or eating patterns.  Feeling an overall lack of motivation or enjoyment of activities that you previously enjoyed.  Frequent crying spells.  Talk with your health care provider if you think that you are experiencing depression. What should I know about immunizations? It is important that you get and maintain your immunizations. These include:  Tetanus, diphtheria, and pertussis (Tdap) booster vaccine.  Influenza every year before the flu season begins.  Pneumonia vaccine.  Shingles vaccine.  Your health care provider may also  recommend other immunizations. This information is not intended to replace advice given to you by your health care provider. Make sure you discuss any questions you have with your health care provider. Document Released: 05/27/2005 Document Revised: 10/23/2015 Document Reviewed: 01/06/2015 Elsevier Interactive Patient Education  2018 [employer].

## 2017-05-13 NOTE — Assessment & Plan Note (Signed)
Annual comprehensive preventive exam was done as well as an evaluation and management of chronic conditions .  During the course of the visit the patient was educated and counseled about appropriate screening and preventive services including :  diabetes screening, lipid analysis with projected  10 year  risk for CAD , nutrition counseling, breast, cervical and colorectal cancer screening, and recommended immunizations.  Printed recommendations for health maintenance screenings was given.  Mammogram and DEXA scan will be ordered once she decides where to have them done.

## 2017-05-13 NOTE — Assessment & Plan Note (Signed)
Managed with careful diet.  No surgical intervention planned.

## 2017-05-17 DIAGNOSIS — I82409 Acute embolism and thrombosis of unspecified deep veins of unspecified lower extremity: Secondary | ICD-10-CM | POA: Diagnosis not present

## 2017-05-17 DIAGNOSIS — I2782 Chronic pulmonary embolism: Secondary | ICD-10-CM | POA: Diagnosis not present

## 2017-05-17 DIAGNOSIS — I10 Essential (primary) hypertension: Secondary | ICD-10-CM | POA: Diagnosis not present

## 2017-05-17 DIAGNOSIS — I471 Supraventricular tachycardia: Secondary | ICD-10-CM | POA: Diagnosis not present

## 2017-05-18 ENCOUNTER — Other Ambulatory Visit: Payer: Self-pay | Admitting: Cardiology

## 2017-05-18 DIAGNOSIS — R0602 Shortness of breath: Secondary | ICD-10-CM

## 2017-05-18 DIAGNOSIS — I10 Essential (primary) hypertension: Secondary | ICD-10-CM

## 2017-05-22 ENCOUNTER — Other Ambulatory Visit: Payer: Self-pay | Admitting: Internal Medicine

## 2017-05-22 MED ORDER — BECLOMETHASONE DIPROP HFA 80 MCG/ACT IN AERB: 2.0000 | INHALATION_SPRAY | Freq: Two times a day (BID) | RESPIRATORY_TRACT | 0 refills | Status: DC

## 2017-05-25 ENCOUNTER — Ambulatory Visit
Admission: RE | Admit: 2017-05-25 | Discharge: 2017-05-25 | Disposition: A | Discharge status: Home / Self Care | Payer: 59 | Source: Ambulatory Visit | Attending: Cardiology | Admitting: Cardiology

## 2017-05-25 ENCOUNTER — Other Ambulatory Visit: Payer: 59

## 2017-05-25 DIAGNOSIS — Z86711 Personal history of pulmonary embolism: Secondary | ICD-10-CM | POA: Diagnosis not present

## 2017-05-25 DIAGNOSIS — I10 Essential (primary) hypertension: Secondary | ICD-10-CM

## 2017-05-25 DIAGNOSIS — I119 Hypertensive heart disease without heart failure: Secondary | ICD-10-CM | POA: Insufficient documentation

## 2017-05-25 DIAGNOSIS — R0602 Shortness of breath: Secondary | ICD-10-CM | POA: Diagnosis present

## 2017-05-25 DIAGNOSIS — Z86718 Personal history of other venous thrombosis and embolism: Secondary | ICD-10-CM | POA: Diagnosis not present

## 2017-05-25 NOTE — Progress Notes (Signed)
*  PRELIMINARY RESULTS* Echocardiogram 2D Echocardiogram has been performed.  Tiffany Wells 05/25/2017, 11:45 AM

## 2017-06-06 ENCOUNTER — Other Ambulatory Visit: Payer: 59

## 2017-06-06 NOTE — Addendum Note (Signed)
Addended by: Leeanne Rio on: 06/06/2017 08:25 AM   Modules accepted: Orders

## 2017-06-07 ENCOUNTER — Encounter: Payer: Self-pay | Admitting: Internal Medicine

## 2017-06-07 DIAGNOSIS — I471 Supraventricular tachycardia: Secondary | ICD-10-CM

## 2017-06-09 ENCOUNTER — Other Ambulatory Visit: Payer: Self-pay | Admitting: Cardiology

## 2017-06-09 DIAGNOSIS — R0789 Other chest pain: Secondary | ICD-10-CM

## 2017-06-13 DIAGNOSIS — I471 Supraventricular tachycardia: Secondary | ICD-10-CM | POA: Diagnosis not present

## 2017-06-13 DIAGNOSIS — R000 Tachycardia, unspecified: Secondary | ICD-10-CM | POA: Diagnosis not present

## 2017-06-14 ENCOUNTER — Encounter: Payer: Self-pay | Admitting: Internal Medicine

## 2017-06-15 ENCOUNTER — Ambulatory Visit: Payer: 59 | Admitting: Internal Medicine

## 2017-06-15 ENCOUNTER — Other Ambulatory Visit (INDEPENDENT_AMBULATORY_CARE_PROVIDER_SITE_OTHER): Payer: 59

## 2017-06-15 ENCOUNTER — Encounter: Payer: Self-pay | Admitting: Internal Medicine

## 2017-06-15 VITALS — BP 122/68 | HR 86 | Ht 68.0 in | Wt 303.0 lb

## 2017-06-15 DIAGNOSIS — E559 Vitamin D deficiency, unspecified: Secondary | ICD-10-CM | POA: Diagnosis not present

## 2017-06-15 DIAGNOSIS — I471 Supraventricular tachycardia: Secondary | ICD-10-CM | POA: Diagnosis not present

## 2017-06-15 DIAGNOSIS — R5383 Other fatigue: Secondary | ICD-10-CM

## 2017-06-15 DIAGNOSIS — R7303 Prediabetes: Secondary | ICD-10-CM | POA: Diagnosis not present

## 2017-06-15 DIAGNOSIS — M6740 Ganglion, unspecified site: Secondary | ICD-10-CM | POA: Insufficient documentation

## 2017-06-15 LAB — COMPREHENSIVE METABOLIC PANEL
ALT: 19 U/L (ref 0–35)
AST: 18 U/L (ref 0–37)
Albumin: 3.5 g/dL (ref 3.5–5.2)
Alkaline Phosphatase: 74 U/L (ref 39–117)
BUN: 14 mg/dL (ref 6–23)
CO2: 30 mEq/L (ref 19–32)
Calcium: 9.6 mg/dL (ref 8.4–10.5)
Chloride: 103 mEq/L (ref 96–112)
Creatinine, Ser: 0.83 mg/dL (ref 0.40–1.20)
GFR: 73.64 mL/min (ref >60.00)
Glucose, Bld: 144 mg/dL — ABNORMAL HIGH (ref 70–99)
Potassium: 4.3 mEq/L (ref 3.5–5.1)
Sodium: 141 mEq/L (ref 135–145)
Total Bilirubin: 0.5 mg/dL (ref 0.2–1.2)
Total Protein: 6.3 g/dL (ref 6.0–8.3)

## 2017-06-15 LAB — TSH: TSH: 4.86 u[IU]/mL — ABNORMAL HIGH (ref 0.35–4.50)

## 2017-06-15 LAB — CBC WITH DIFFERENTIAL/PLATELET
Basophils Absolute: 0.1 10*3/uL (ref 0.0–0.1)
Basophils Relative: 1 % (ref 0.0–3.0)
Eosinophils Absolute: 0.1 10*3/uL (ref 0.0–0.7)
Eosinophils Relative: 2.1 % (ref 0.0–5.0)
HCT: 40.3 % (ref 36.0–46.0)
Hemoglobin: 13.7 g/dL (ref 12.0–15.0)
Lymphocytes Relative: 27.5 % (ref 12.0–46.0)
Lymphs Abs: 1.9 10*3/uL (ref 0.7–4.0)
MCHC: 34 g/dL (ref 30.0–36.0)
MCV: 80.3 fl (ref 78.0–100.0)
Monocytes Absolute: 0.5 10*3/uL (ref 0.1–1.0)
Monocytes Relative: 7.5 % (ref 3.0–12.0)
Neutro Abs: 4.2 10*3/uL (ref 1.4–7.7)
Neutrophils Relative %: 61.9 % (ref 43.0–77.0)
Platelets: 186 10*3/uL (ref 150.0–400.0)
RBC: 5.02 Mil/uL (ref 3.87–5.11)
RDW: 15.3 % (ref 11.5–15.5)
WBC: 6.8 10*3/uL (ref 4.0–10.5)

## 2017-06-15 LAB — LIPID PANEL
Cholesterol: 191 mg/dL (ref 0–200)
HDL: 35.4 mg/dL — ABNORMAL LOW (ref >39.00)
LDL Cholesterol: 118 mg/dL — ABNORMAL HIGH (ref 0–99)
NonHDL: 155.18
Total CHOL/HDL Ratio: 5
Triglycerides: 184 mg/dL — ABNORMAL HIGH (ref 0.0–149.0)
VLDL: 36.8 mg/dL (ref 0.0–40.0)

## 2017-06-15 LAB — HEMOGLOBIN A1C: Hgb A1c MFr Bld: 7.1 % — ABNORMAL HIGH (ref 4.6–6.5)

## 2017-06-15 LAB — VITAMIN D 25 HYDROXY (VIT D DEFICIENCY, FRACTURES): VITD: 6.97 ng/mL — ABNORMAL LOW (ref 30.00–100.00)

## 2017-06-15 NOTE — Patient Instructions (Addendum)
Medication Instructions:  Your physician recommends that you continue on your current medications as directed. Please refer to the Current Medication list given to you today.  Labwork: None ordered.  Testing/Procedures: None ordered.  Follow-Up: Your physician wants you to follow-up with Dr. Lovena Le after we receive the results of your Holter Monitor. Any Other Special Instructions Will Be Listed Below (If Applicable).  If you need a refill on your cardiac medications before your next appointment, please call your pharmacy.

## 2017-06-15 NOTE — Progress Notes (Signed)
HPI Tiffany Wells is referred today by Dr. Ubaldo Glassing for evaluation of SVT. She is a pleasant overweight 62 yo woman with an 8 year history of palpitations. She has sought medical attention at [facility] in the past. I do not have these records. She notes that her episodes start and stop suddenly. She has not had syncope but feels a little sob and chest pressure when she goes out of rhythm. I do not have an ECG or heart monitor of her arrhythmias. She has worn a 48 hour holter and no symptomatic arrhythmias so far. We do not yet have her printout.  Allergies  Allergen Reactions  . Codeine Sulfate     REACTION: Itching  . Sulfa Antibiotics     Facial burning  . Tape Rash    Adhesive tape, silicones     Current Outpatient Medications  Medication Sig Dispense Refill  . beclomethasone (QVAR) 80 MCG/ACT inhaler Inhale 2 puffs into the lungs 2 (two) times daily. RINSE MOUTH WELL AFTER EACH USE TO AVOID THRUSH 10.6 g 0  . cyclobenzaprine (FLEXERIL) 10 MG tablet TAKE 1/2 TO 1 TABLET BY MOUTH 3 TIMES A DAY AS NEEDED FOR BACK PAIN 180 tablet 2  . dicyclomine (BENTYL) 10 MG capsule Take 10 mg by mouth daily.     [name] diltiazem (CARDIZEM) 30 MG tablet Take 30 mg by mouth 4 (four) times daily.    [name] diltiazem (CARTIA XT) 240 MG 24 hr capsule Take 240 mg by mouth daily.    . diphenhydrAMINE (BENADRYL) 25 MG tablet As needed for allergies.     [name] estradiol (ESTRACE) 0.1 MG/GM vaginal cream PLACE 1/4 APPLICATORFUL VAGINALLY DAILY. 42.5 g 11  . hyoscyamine (LEVSIN SL) 0.125 MG SL tablet Place 0.125 mg under the tongue every 4 (four) hours as needed for cramping.    . metoprolol succinate (TOPROL-XL) 25 MG 24 hr tablet Take 3 tablets (75 mg total) by mouth daily. (Patient taking differently: Take 37.5 mg by mouth 2 (two) times daily. Take 1.5 tablets twice daily) 90 tablet 11  . omeprazole (PRILOSEC OTC) 20 MG tablet Take 20 mg by mouth daily.      . chlorpheniramine-HYDROcodone (TUSSIONEX PENNKINETIC ER) 10-8  MG/5ML SUER Take 5 mLs by mouth every 12 (twelve) hours as needed for cough. (Patient not taking: Reported on 06/15/2017) 280 mL 0   No current facility-administered medications for this visit.      Past Medical History:  Diagnosis Date  . Acute pulmonary embolism ([place]) 08/06/2013   Diagnosed during hospitalization for TKR.  Patient received heparin followed by Xarelto whic hwill continue for a total of 6 months.   [facility] has recommended IVC placement prior to next TKR and bridging with Lovenox (June 17  [place])    . Arthritis    osteoarthritis of knee  . Back pain   . DVT (deep venous thrombosis) ([place]) 12/28/2016  . Dysrhythmia 12/28/2016   atrial fibrillation, SVT  . Headache    Migraines  . History of Salmonella gastroenteritis 2012  . Hypertension   . IBS (irritable bowel syndrome)   . Internal hemorrhoids   . Ischemic colitis ([place]) 02/21/2014  . Normal cardiac stress test    2006, nromal stress cardiolite, EF 65%  . Pulmonary embolism ([place])    post knee surgery    ROS:   All systems reviewed and negative except as noted in the HPI.   Past Surgical History:  Procedure Laterality Date  . COLONOSCOPY  03/07/2011   Int Hemms  . COLONOSCOPY WITH PROPOFOL N/A 02/20/2017   Procedure: COLONOSCOPY WITH PROPOFOL;  Surgeon: Manya Silvas, MD;  Location: [facility] ENDOSCOPY;  Service: Endoscopy;  Laterality: N/A;  . HYSTEROSCOPY    . JOINT REPLACEMENT     Bilateral TKR  . ROTATOR CUFF REPAIR    . TUBAL LIGATION    . UPPER GASTROINTESTINAL ENDOSCOPY  03/07/2011   [facility] Multip Gastric Polyps/ Dr Vira Agar     Family History  Problem Relation Age of Onset  . Hypertension Mother   . Diabetes Mother   . Cancer Mother        glioma  . Hypertension Father   . Heart disease Father        CAD with CABG  . Cancer Father        multiple myeloma  . Arthritis Sister   . Hypertension Sister   . Cancer Brother        Pre-cancerous lip lesion, testicular CA   . Cancer Maternal  Grandmother        Lung Ca, nonsmoker      Social History   Socioeconomic History  . Marital status: Married    Spouse name: Not on file  . Number of children: 2  . Years of education: Not on file  . Highest education level: Not on file  Social Needs  . Financial resource strain: Not on file  . Food insecurity - worry: Not on file  . Food insecurity - inability: Not on file  . Transportation needs - medical: Not on file  . Transportation needs - non-medical: Not on file  Occupational History  . Occupation: Glass blower/designer, [employer]  Tobacco Use  . Smoking status: Never Smoker  . Smokeless tobacco: Never Used  Substance and Sexual Activity  . Alcohol use: Yes    Alcohol/week: 1.2 oz    Types: 2 Glasses of wine per week    Comment: very rare  . Drug use: No  . Sexual activity: Not on file  Other Topics Concern  . Not on file  Social History Narrative   Lives with husband.     BP 122/68   Pulse 86   Ht _0  (1.727 m)   Wt (!) 303 lb (137.4 kg)   BMI 46.07 kg/m   Physical Exam:  Well appearing overweight woman, NAD HEENT: Unremarkable Neck:  No JVD, no thyromegally Lymphatics:  No adenopathy Back:  No CVA tenderness Lungs:  Clear with no wheezes HEART:  Regular rate rhythm, no murmurs, no rubs, no clicks Abd:  soft, overweight, positive bowel sounds, no organomegally, no rebound, no guarding Ext:  2 plus pulses, no edema, no cyanosis, no clubbing Skin:  No rashes no nodules Neuro:  CN II through XII intact, motor grossly intact  EKG - NSR with no ventricular pre-excitation.    Assess/Plan: 1. Palpitations - the mechanism of her symptoms is unclear to me. She has some symptoms that might be SVT and some that might suggest atrial fib. We discussed medical therapy and catheter ablation in detail. She is pending a stress test. I do not have any ECG's which suggest atrial fib but she could be having this as well. Will follow. 2. Obesity - as it pertains  to atrial fib, I have asked her to lose weight.

## 2017-06-16 LAB — CALCIUM, IONIZED: Calcium, Ion: 4.9 mg/dL (ref 4.8–5.6)

## 2017-06-18 ENCOUNTER — Encounter: Payer: Self-pay | Admitting: Internal Medicine

## 2017-06-19 ENCOUNTER — Encounter: Payer: Self-pay | Admitting: Internal Medicine

## 2017-06-19 ENCOUNTER — Ambulatory Visit
Admission: RE | Admit: 2017-06-19 | Discharge: 2017-06-19 | Disposition: A | Discharge status: Home / Self Care | Payer: 59 | Source: Ambulatory Visit | Attending: Cardiology | Admitting: Cardiology

## 2017-06-19 DIAGNOSIS — I471 Supraventricular tachycardia: Secondary | ICD-10-CM | POA: Diagnosis not present

## 2017-06-19 DIAGNOSIS — R0789 Other chest pain: Secondary | ICD-10-CM | POA: Diagnosis not present

## 2017-06-19 LAB — NM MYOCAR MULTI W/SPECT W/WALL MOTION / EF
Estimated workload: 1 METS
Exercise duration (min): 1 min
Exercise duration (sec): 0 s
LV dias vol: 42 mL (ref 46–106)
LV sys vol: 14 mL
MPHR: 157 {beats}/min
Peak HR: 105 {beats}/min
Percent HR: 66 %
Rest HR: 81 {beats}/min
SDS: 0
SRS: 14
SSS: 1
TID: 1.05

## 2017-06-19 MED ORDER — REGADENOSON 0.4 MG/5ML IV SOLN
0.4000 mg | Freq: Once | INTRAVENOUS | Status: AC
  Administered 2017-06-19: 0.4 mg via INTRAVENOUS

## 2017-06-19 MED ORDER — TECHNETIUM TC 99M TETROFOSMIN IV KIT
13.0000 | PACK | Freq: Once | INTRAVENOUS | Status: AC | PRN
  Administered 2017-06-19: 13.49 via INTRAVENOUS

## 2017-06-19 MED ORDER — TECHNETIUM TC 99M TETROFOSMIN IV KIT
30.0000 | PACK | Freq: Once | INTRAVENOUS | Status: AC | PRN
  Administered 2017-06-19: 31.88 via INTRAVENOUS

## 2017-06-20 ENCOUNTER — Other Ambulatory Visit: Payer: Self-pay | Admitting: Internal Medicine

## 2017-06-20 DIAGNOSIS — R7989 Other specified abnormal findings of blood chemistry: Secondary | ICD-10-CM | POA: Insufficient documentation

## 2017-06-20 DIAGNOSIS — I152 Hypertension secondary to endocrine disorders: Secondary | ICD-10-CM | POA: Insufficient documentation

## 2017-06-20 DIAGNOSIS — E118 Type 2 diabetes mellitus with unspecified complications: Secondary | ICD-10-CM | POA: Insufficient documentation

## 2017-06-20 DIAGNOSIS — E119 Type 2 diabetes mellitus without complications: Secondary | ICD-10-CM | POA: Insufficient documentation

## 2017-06-20 MED ORDER — ERGOCALCIFEROL 1.25 MG (50000 UT) PO CAPS: 50000.0000 [IU] | ORAL_CAPSULE | ORAL | 0 refills | Status: DC

## 2017-06-28 DIAGNOSIS — L309 Dermatitis, unspecified: Secondary | ICD-10-CM | POA: Diagnosis not present

## 2017-07-04 ENCOUNTER — Other Ambulatory Visit (INDEPENDENT_AMBULATORY_CARE_PROVIDER_SITE_OTHER): Payer: 59 | Admitting: *Deleted

## 2017-07-04 ENCOUNTER — Other Ambulatory Visit: Payer: Self-pay | Admitting: General Surgery

## 2017-07-04 DIAGNOSIS — R3915 Urgency of urination: Secondary | ICD-10-CM

## 2017-07-04 NOTE — Progress Notes (Signed)
Urine sent for c/s and u/a per Dr Bary Castilla

## 2017-07-04 NOTE — Patient Instructions (Signed)
The patient is aware to call back for any questions or concerns.  

## 2017-07-05 ENCOUNTER — Encounter: Payer: Self-pay | Admitting: Internal Medicine

## 2017-07-05 LAB — MICROSCOPIC EXAMINATION

## 2017-07-05 LAB — URINALYSIS, ROUTINE W REFLEX MICROSCOPIC
Bilirubin, UA: NEGATIVE
Glucose, UA: NEGATIVE
Ketones, UA: NEGATIVE
Nitrite, UA: NEGATIVE
Protein, UA: NEGATIVE
Specific Gravity, UA: 1.015 (ref 1.005–1.030)
Urobilinogen, Ur: 0.2 mg/dL (ref 0.2–1.0)
pH, UA: 5 (ref 5.0–7.5)

## 2017-07-05 LAB — SPECIMEN STATUS REPORT

## 2017-07-07 LAB — URINE CULTURE

## 2017-07-08 ENCOUNTER — Other Ambulatory Visit: Payer: Self-pay | Admitting: Internal Medicine

## 2017-07-08 MED ORDER — CIPROFLOXACIN HCL 250 MG PO TABS: 250.0000 mg | ORAL_TABLET | Freq: Two times a day (BID) | ORAL | 0 refills | Status: DC

## 2017-07-10 ENCOUNTER — Other Ambulatory Visit: Payer: Self-pay | Admitting: Internal Medicine

## 2017-07-17 ENCOUNTER — Other Ambulatory Visit: Payer: Self-pay | Admitting: *Deleted

## 2017-07-21 ENCOUNTER — Ambulatory Visit: Payer: 59 | Admitting: Internal Medicine

## 2017-07-21 ENCOUNTER — Encounter: Payer: Self-pay | Admitting: Internal Medicine

## 2017-07-21 VITALS — BP 126/74 | HR 86 | Ht 68.0 in | Wt 300.0 lb

## 2017-07-21 DIAGNOSIS — I471 Supraventricular tachycardia: Secondary | ICD-10-CM

## 2017-07-21 NOTE — Progress Notes (Signed)
HPI Tiffany Wells returns today for ongoing evaluation of palpitations including SVT.  She is a very pleasant 62 year old woman with a history of tachypalpitations which have been treated with a combination of calcium channel blockers and beta-blockers.  Over 3 years ago, she presented with SVT at over 200 bpm.  I do not have details of her treatment but do have an EKG which suggest a narrow QRS short RP tachycardia.  She does not remember receiving intravenous adenosine.  She was placed on medical therapy and also has hypertension.  The patient has since her last visit had only a couple episodes of palpitations.  Review of her heart monitor demonstrates brief salvos of what appears to be atrial tachycardia.  The quality of her data strips as presented to me are not particularly good making it difficult to know for sure the mechanism of her arrhythmia.  It appears that she most likely has atrial tachycardia though I cannot exclude paroxysms of atrial fibrillation.  Regardless, she is not particularly symptomatic and will require medical therapy for her hypertension.  She denies chest pain or shortness of breath.  She gets winded when she walks up inclines but on flat ground and over short distances she is asymptomatic. Allergies  Allergen Reactions  . Codeine Itching    REACTION: Itching  . Codeine Sulfate     REACTION: Itching  . Sulfa Antibiotics     Facial burning  . Tape Rash    Adhesive tape, silicones     Current Outpatient Medications  Medication Sig Dispense Refill  . cyclobenzaprine (FLEXERIL) 10 MG tablet Take 10 mg by mouth daily.    [name] dicyclomine (BENTYL) 10 MG capsule Take 10 mg by mouth daily.     [name] diltiazem (CARDIZEM) 30 MG tablet Take 30 mg by mouth daily as needed (SVT).     [name] diltiazem (CARTIA XT) 240 MG 24 hr capsule Take 240 mg by mouth daily.    . diphenhydrAMINE (BENADRYL) 25 MG tablet As needed for allergies.     [name] ergocalciferol (DRISDOL) 50000 units capsule  Take 1 capsule (50,000 Units total) by mouth once a week. 12 capsule 0  . estradiol (ESTRACE) 0.1 MG/GM vaginal cream PLACE 1/4 APPLICATORFUL VAGINALLY DAILY. 42.5 g 11  . hyoscyamine (LEVSIN SL) 0.125 MG SL tablet Place 0.125 mg under the tongue every 4 (four) hours as needed for cramping.    . metoprolol succinate (TOPROL-XL) 50 MG 24 hr tablet Take 50 mg by mouth 2 (two) times daily. Take with or immediately following a meal.    . omeprazole (PRILOSEC OTC) 20 MG tablet Take 20 mg by mouth daily.       No current facility-administered medications for this visit.      Past Medical History:  Diagnosis Date  . Acute pulmonary embolism ([place]) 08/06/2013   Diagnosed during hospitalization for TKR.  Patient received heparin followed by Xarelto whic hwill continue for a total of 6 months.   [facility] has recommended IVC placement prior to next TKR and bridging with Lovenox (June 17  [place])    . Arthritis    osteoarthritis of knee  . Back pain   . DVT (deep venous thrombosis) ([place]) 12/28/2016  . Dysrhythmia 12/28/2016   atrial fibrillation, SVT  . Headache    Migraines  . History of Salmonella gastroenteritis 2012  . Hypertension   . IBS (irritable bowel syndrome)   . Internal hemorrhoids   . Ischemic colitis ([place]) 02/21/2014  .  Normal cardiac stress test    2006, nromal stress cardiolite, EF 65%  . Pulmonary embolism ([place])    post knee surgery    ROS:   All systems reviewed and negative except as noted in the HPI.   Past Surgical History:  Procedure Laterality Date  . COLONOSCOPY  03/07/2011   Int Hemms  . COLONOSCOPY WITH PROPOFOL N/A 02/20/2017   Procedure: COLONOSCOPY WITH PROPOFOL;  Surgeon: Manya Silvas, MD;  Location: [facility] ENDOSCOPY;  Service: Endoscopy;  Laterality: N/A;  . HYSTEROSCOPY    . JOINT REPLACEMENT     Bilateral TKR  . ROTATOR CUFF REPAIR    . TUBAL LIGATION    . UPPER GASTROINTESTINAL ENDOSCOPY  03/07/2011   HH Multip Gastric Polyps/ Dr Vira Agar      Family History  Problem Relation Age of Onset  . Hypertension Mother   . Diabetes Mother   . Cancer Mother        glioma  . Hypertension Father   . Heart disease Father        CAD with CABG  . Cancer Father        multiple myeloma  . Arthritis Sister   . Hypertension Sister   . Cancer Brother        Pre-cancerous lip lesion, testicular CA   . Cancer Maternal Grandmother        Lung Ca, nonsmoker      Social History   Socioeconomic History  . Marital status: Married    Spouse name: Not on file  . Number of children: 2  . Years of education: Not on file  . Highest education level: Not on file  Occupational History  . Occupation: Glass blower/designer, M[employer]  Social Needs  . Financial resource strain: Not on file  . Food insecurity:    Worry: Not on file    Inability: Not on file  . Transportation needs:    Medical: Not on file    Non-medical: Not on file  Tobacco Use  . Smoking status: Never Smoker  . Smokeless tobacco: Never Used  Substance and Sexual Activity  . Alcohol use: Yes    Alcohol/week: 1.2 oz    Types: 2 Glasses of wine per week    Comment: very rare  . Drug use: No  . Sexual activity: Not on file  Lifestyle  . Physical activity:    Days per week: Not on file    Minutes per session: Not on file  . Stress: Not on file  Relationships  . Social connections:    Talks on phone: Not on file    Gets together: Not on file    Attends religious service: Not on file    Active member of club or organization: Not on file    Attends meetings of clubs or organizations: Not on file    Relationship status: Not on file  . Intimate partner violence:    Fear of current or ex partner: Not on file    Emotionally abused: Not on file    Physically abused: Not on file    Forced sexual activity: Not on file  Other Topics Concern  . Not on file  Social History Narrative   Lives with husband.     BP 126/74   Pulse 86   Ht '5\' 8"'$  (1.727 m)   Wt 300  lb (136.1 kg)   SpO2 98%   BMI 45.61 kg/m   Physical Exam:  Obese but otherwise well  appearing middle-aged woman, NAD HEENT: Unremarkable Neck: 6 cm JVD, no thyromegally Lymphatics:  No adenopathy Back:  No CVA tenderness Lungs:  Clear, with no wheezes, rales, or rhonchi HEART:  Regular rate rhythm, no murmurs, no rubs, no clicks Abd:  soft, positive bowel sounds, no organomegally, no rebound, no guarding Ext:  2 plus pulses, no edema, no cyanosis, no clubbing Skin:  No rashes no nodules Neuro:  CN II through XII intact, motor grossly intact  Old EKGs -reviewed  Assess/Plan: 1.  Palpitations -based on a review of her available ECGs, I suspect she has both atrial tachycardia as well as AV node reentrant tachycardia.  She could also have atrial fibrillation though this is not documented.  She has been fairly well controlled with medical therapy.  If she has breakthroughs, catheter ablation or flecainide would be considerations.  Her obesity will make management of her arrhythmia is more difficult.  2.  SVT -we have evidence of sustained narrow QRS tachycardia from an EKG almost 4 years ago.  Her symptoms currently do not suggest that she is having much of this.  Currently she is having more salvos in the sensation of irregular heartbeat.  We discussed cardiac monitoring including the possibility of an apple watch.  I have recommended that we treat her symptoms.  In that regard if she has recurrent uncontrolled palpitations, I would recommend a trial of flecainide. 3.  Obesity -she is overweight and needs to work on losing weight. 4.  Hypertension -blood pressure is well-controlled today on a combination of medications.  No change in medicines at this time.  Crissie Sickles, MD

## 2017-07-21 NOTE — Patient Instructions (Addendum)
Medication Instructions:  Your physician recommends that you continue on your current medications as directed. Please refer to the Current Medication list given to you today.  Labwork: None ordered.  Testing/Procedures: None ordered.  Follow-Up: Your physician wants you to follow-up in: 4 or 5 months with Dr. Lovena Le.     Any Other Special Instructions Will Be Listed Below (If Applicable).  If you need a refill on your cardiac medications before your next appointment, please call your pharmacy.

## 2017-07-22 ENCOUNTER — Encounter: Payer: Self-pay | Admitting: Internal Medicine

## 2017-07-28 ENCOUNTER — Other Ambulatory Visit: Payer: Self-pay

## 2017-07-28 ENCOUNTER — Encounter: Payer: Self-pay | Admitting: Internal Medicine

## 2017-07-28 ENCOUNTER — Ambulatory Visit: Payer: 59 | Admitting: Internal Medicine

## 2017-07-28 VITALS — BP 128/70 | HR 82 | Temp 97.6°F | Wt 303.0 lb

## 2017-07-28 DIAGNOSIS — E119 Type 2 diabetes mellitus without complications: Secondary | ICD-10-CM

## 2017-07-28 DIAGNOSIS — Z794 Long term (current) use of insulin: Secondary | ICD-10-CM | POA: Diagnosis not present

## 2017-07-28 NOTE — Progress Notes (Signed)
Subjective:  Patient ID: Tiffany Wells, female    DOB: April 07, 1956  Age: 62 y.o. MRN: [id number]  CC: The primary encounter diagnosis was Type 2 diabetes mellitus without complication, with long-term current use of insulin ([place]). A diagnosis of Morbid obesity ([place]) was also pertinent to this visit.  HPI Tiffany Wells presents for discussion and management of new onset type 2  DM,and follow up on other conditions.  She has had 2 episodes pf SVT/atrial fib since February  And Dr Ubaldo Glassing has increased her metoprolol dose to 50 mg bid.  She is considering undergoing  ablation , and has had a negative stress test.   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  She often skips meals  Due to work responsibilities.      Outpatient Medications Prior to Visit  Medication Sig Dispense Refill  . cyclobenzaprine (FLEXERIL) 10 MG tablet Take 10 mg by mouth daily.    [name] dicyclomine (BENTYL) 10 MG capsule Take 10 mg by mouth daily.     [name] diltiazem (CARDIZEM) 30 MG tablet Take 30 mg by mouth daily as needed (SVT).     [name] diltiazem (CARTIA XT) 240 MG 24 hr capsule Take 240 mg by mouth daily.    . diphenhydrAMINE (BENADRYL) 25 MG tablet As needed for allergies.     [name] ergocalciferol (DRISDOL) 50000 units capsule Take 1 capsule (50,000 Units total) by mouth once a week. 12 capsule 0  . estradiol (ESTRACE) 0.1 MG/GM vaginal cream PLACE 1/4 APPLICATORFUL VAGINALLY DAILY. 42.5 g 11  . hyoscyamine (LEVSIN SL) 0.125 MG SL tablet Place 0.125 mg under the tongue every 4 (four) hours as needed for cramping.    . metoprolol succinate (TOPROL-XL) 50 MG 24 hr tablet Take 50 mg by mouth 2 (two) times daily. Take with or immediately following a meal.    . omeprazole (PRILOSEC OTC) 20 MG tablet Take 20 mg by mouth daily.       No facility-administered  medications prior to visit.     Review of Systems;  Patient denies headache, fevers, malaise, unintentional weight loss, skin rash, eye pain, sinus congestion and sinus pain, sore throat, dysphagia,  hemoptysis , cough, dyspnea, wheezing, chest pain, palpitations, orthopnea, edema, abdominal pain, nausea, melena, diarrhea, constipation, flank pain, dysuria, hematuria, urinary  Frequency, nocturia, numbness, tingling, seizures,  Focal weakness, Loss of consciousness,  Tremor, insomnia, depression, anxiety, and suicidal ideation.      Objective:  BP 128/70 (BP Location: Left Arm, Patient Position: Sitting, Cuff Size: Large)   Pulse 82   Temp 97.6 F (36.4 C)   Wt (!) 303 lb (137.4 kg)   SpO2 97%   BMI 46.07 kg/m   BP Readings from Last 3 Encounters:  07/28/17 128/70  07/21/17 126/74  06/15/17 122/68    Wt Readings from Last 3 Encounters:  07/28/17 (!) 303 lb (137.4 kg)  07/21/17 300 lb (136.1 kg)  06/15/17 (!) 303 lb (137.4 kg)    General appearance: alert, cooperative and appears stated age Ears: normal TM's and external ear canals both ears Throat: lips, mucosa, and tongue normal; teeth and gums normal Neck: no adenopathy, no carotid bruit, supple, symmetrical, trachea midline and thyroid not enlarged, symmetric, no tenderness/mass/nodules Back: symmetric, no curvature. ROM normal. No CVA tenderness. Lungs: clear to auscultation bilaterally Heart: regular rate and rhythm,  S1, S2 normal, no murmur, click, rub or gallop Abdomen: soft, non-tender; bowel sounds normal; no masses,  no organomegaly Pulses: 2+ and symmetric Skin: Skin color, texture, turgor normal. No rashes or lesions Lymph nodes: Cervical, supraclavicular, and axillary nodes normal.  Lab Results  Component Value Date   HGBA1C 7.1 (H) 06/15/2017   HGBA1C 5.9 10/12/2015    Lab Results  Component Value Date   CREATININE 0.83 06/15/2017   CREATININE 0.83 10/12/2015   CREATININE 0.88 02/15/2014    Lab  Results  Component Value Date   WBC 6.8 06/15/2017   HGB 13.7 06/15/2017   HCT 40.3 06/15/2017   PLT 186.0 06/15/2017   GLUCOSE 144 (H) 06/15/2017   CHOL 191 06/15/2017   TRIG 184.0 (H) 06/15/2017   HDL 35.40 (L) 06/15/2017   LDLDIRECT 106.0 10/12/2015   LDLCALC 118 (H) 06/15/2017   ALT 19 06/15/2017   AST 18 06/15/2017   NA 141 06/15/2017   K 4.3 06/15/2017   CL 103 06/15/2017   CREATININE 0.83 06/15/2017   BUN 14 06/15/2017   CO2 30 06/15/2017   TSH 4.86 (H) 06/15/2017   INR 1.1 02/13/2014   HGBA1C 7.1 (H) 06/15/2017    Nm Myocar Multi W/spect W/wall Motion / Ef  Result Date: 06/19/2017  Blood pressure demonstrated a normal response to exercise.  There was no ST segment deviation noted during stress.  No T wave inversion was noted during stress.  Normal wall motion  Ejection fraction 71%  The study is normal.  This is a low risk study.  The left ventricular ejection fraction is hyperdynamic (>65%).  Nuclear stress EF: 71%.  Conclusion Normal myocardial perfusion scan No Evidence of stress-induced medical ischemia Ejection fraction of 71% Low risk scan    Assessment & Plan:   Problem List Items Addressed This Visit    Type 2 diabetes mellitus without complications ([place]) - Primary    Diagnosis discussed. patinet advised to start checking blood sugars once daily at various times prior to consideration of medication.  Given her morbid obesity,  Victoza would be considered.   Lab Results  Component Value Date   HGBA1C 7.1 (H) 06/15/2017   No results found for: MICROALBUR, [id number]       Relevant Orders   Microalbumin / creatinine urine ratio   Morbid obesity ([place])    With new onset diabetes .  Her personal best is 257 lbs in 2015,  But she admits that she is not exercising at all and has taken on too many responsibilities at work to make any lifestyle changes until "the end of the year."         A total of 25 minutes of face to face time was spent with  patient more than half of which was spent in counselling about the above mentioned conditions  and coordination of care   I am having Tiffany Wells maintain her diphenhydrAMINE, omeprazole, estradiol, dicyclomine, diltiazem, diltiazem, hyoscyamine, ergocalciferol, metoprolol succinate, and cyclobenzaprine.  No orders of the defined types were placed in this encounter.   There are no discontinued medications.  Follow-up: Return in about 3 months (around 10/27/2017) for follow up diabetes.   Crecencio Mc, MD

## 2017-07-28 NOTE — Patient Instructions (Signed)
Diabetes Mellitus and Standards of Medical Care Managing diabetes (diabetes mellitus) can be complicated. Your diabetes treatment may be managed by a team of health care providers, including:  A diet and nutrition specialist (registered dietitian).  A nurse.  A certified diabetes educator (CDE).  A diabetes specialist (endocrinologist).  An eye doctor.  A primary care provider.  A dentist.  Your health care providers follow a schedule in order to help you get the best quality of care. The following schedule is a general guideline for your diabetes management plan. Your health care providers may also give you more specific instructions. HbA1c ( hemoglobin A1c) test This test provides information about blood sugar (glucose) control over the previous 2-3 months. It is used to check whether your diabetes management plan needs to be adjusted.  If you are meeting your treatment goals, this test is done at least 2 times a year.  If you are not meeting treatment goals or if your treatment goals have changed, this test is done 4 times a year.  Blood pressure test  This test is done at every routine medical visit. For most people, the goal is less than 130/80. Ask your health care provider what your goal blood pressure should be. Dental and eye exams  Visit your dentist two times a year.  If you have type 1 diabetes, get an eye exam 3-5 years after you are diagnosed, and then once a year after your first exam. ? If you were diagnosed with type 1 diabetes as a child, get an eye exam when you are age 16 or older and have had diabetes for 3-5 years. After the first exam, you should get an eye exam once a year.  If you have type 2 diabetes, have an eye exam as soon as you are diagnosed, and then once a year after your first exam. Foot care exam  Visual foot exams are done at every routine medical visit. The exams check for cuts, bruises, redness, blisters, sores, or other problems with the  feet.  A complete foot exam is done by your health care provider once a year. This exam includes an inspection of the structure and skin of your feet, and a check of the pulses and sensation in your feet. ? Type 1 diabetes: Get your first exam 3-5 years after diagnosis. ? Type 2 diabetes: Get your first exam as soon as you are diagnosed.  Check your feet every day for cuts, bruises, redness, blisters, or sores. If you have any of these or other problems that are not healing, contact your health care provider. Kidney function test ( urine microalbumin)  This test is done once a year. ? Type 1 diabetes: Get your first test 5 years after diagnosis. ? Type 2 diabetes: Get your first test as soon as you are diagnosed.  If you have chronic kidney disease (CKD), get a serum creatinine and estimated glomerular filtration rate (eGFR) test once a year. Lipid profile (cholesterol, HDL, LDL, triglycerides)  This test should be done when you are diagnosed with diabetes, and every 5 years after the first test. If you are on medicines to lower your cholesterol, you may need to get this test done every year. ? The goal for LDL is less than 100 mg/dL (5.5 mmol/L). If you are at high risk, the goal is less than 70 mg/dL (3.9 mmol/L). ? The goal for HDL is 40 mg/dL (2.2 mmol/L) for men and 50 mg/dL(2.8 mmol/L) for women. An HDL  cholesterol of 60 mg/dL (3.3 mmol/L) or higher gives some protection against heart disease. ? The goal for triglycerides is less than 150 mg/dL (8.3 mmol/L). Immunizations  The yearly flu (influenza) vaccine is recommended for everyone 6 months or older who has diabetes.  The pneumonia (pneumococcal) vaccine is recommended for everyone 2 years or older who has diabetes. If you are 97 or older, you may get the pneumonia vaccine as a series of two separate shots.  The hepatitis B vaccine is recommended for adults shortly after they have been diagnosed with diabetes.  The Tdap  (tetanus, diphtheria, and pertussis) vaccine should be given: ? According to normal childhood vaccination schedules, for children. ? Every 10 years, for adults who have diabetes.  The shingles vaccine is recommended for people who have had chicken pox and are 50 years or older. Mental and emotional health  Screening for symptoms of eating disorders, anxiety, and depression is recommended at the time of diagnosis and afterward as needed. If your screening shows that you have symptoms (you have a positive screening result), you may need further evaluation and be referred to a mental health care provider. Diabetes self-management education  Education about how to manage your diabetes is recommended at diagnosis and ongoing as needed. Treatment plan  Your treatment plan will be reviewed at every medical visit. Summary  Managing diabetes (diabetes mellitus) can be complicated. Your diabetes treatment may be managed by a team of health care providers.  Your health care providers follow a schedule in order to help you get the best quality of care.  Standards of care including having regular physical exams, blood tests, blood pressure monitoring, immunizations, screening tests, and education about how to manage your diabetes.  Your health care providers may also give you more specific instructions based on your individual health. This information is not intended to replace advice given to you by your health care provider. Make sure you discuss any questions you have with your health care provider. Document Released: 01/30/2009 Document Revised: 01/01/2016 Document Reviewed: 01/01/2016 Elsevier Interactive Patient Education  [employer].

## 2017-07-30 NOTE — Assessment & Plan Note (Signed)
Diagnosis discussed. patinet advised to start checking blood sugars once daily at various times prior to consideration of medication.  Given her morbid obesity,  Victoza would be considered.   Lab Results  Component Value Date   HGBA1C 7.1 (H) 06/15/2017   No results found for: Derl Barrow

## 2017-07-30 NOTE — Assessment & Plan Note (Addendum)
With new onset diabetes .  Her personal best is 257 lbs in 2015,  But she admits that she is not exercising at all and has taken on too many responsibilities at work to make any lifestyle changes until "the end of the year."

## 2017-10-02 ENCOUNTER — Other Ambulatory Visit: Payer: Self-pay | Admitting: Internal Medicine

## 2017-10-02 NOTE — Telephone Encounter (Signed)
Refilled: 06/20/2017 Last OV: 07/28/2017 Next OV: 10/27/2017 Last Vitamin D: 06/15/2017

## 2017-10-27 ENCOUNTER — Ambulatory Visit: Payer: 59 | Admitting: Internal Medicine

## 2017-11-09 ENCOUNTER — Ambulatory Visit: Payer: 59 | Admitting: Internal Medicine

## 2017-11-09 ENCOUNTER — Encounter: Payer: Self-pay | Admitting: Internal Medicine

## 2017-11-09 VITALS — BP 120/72 | HR 78 | Temp 98.1°F | Resp 15 | Ht 68.0 in | Wt 293.1 lb

## 2017-11-09 DIAGNOSIS — Z1239 Encounter for other screening for malignant neoplasm of breast: Secondary | ICD-10-CM

## 2017-11-09 DIAGNOSIS — Z780 Asymptomatic menopausal state: Secondary | ICD-10-CM | POA: Diagnosis not present

## 2017-11-09 DIAGNOSIS — E7800 Pure hypercholesterolemia, unspecified: Secondary | ICD-10-CM | POA: Diagnosis not present

## 2017-11-09 DIAGNOSIS — I10 Essential (primary) hypertension: Secondary | ICD-10-CM

## 2017-11-09 DIAGNOSIS — K580 Irritable bowel syndrome with diarrhea: Secondary | ICD-10-CM

## 2017-11-09 DIAGNOSIS — Z1231 Encounter for screening mammogram for malignant neoplasm of breast: Secondary | ICD-10-CM

## 2017-11-09 DIAGNOSIS — E119 Type 2 diabetes mellitus without complications: Secondary | ICD-10-CM

## 2017-11-09 MED ORDER — ESTRADIOL 0.1 MG/GM VA CREA: TOPICAL_CREAM | VAGINAL | 11 refills | Status: DC

## 2017-11-09 NOTE — Patient Instructions (Signed)
Return in early September for fasting labs  You do no need to see me!  Use the glucometer to check sugars at various times:  Fasting 2 hr post prandial (any neal)  Bedtime    Diabetes Mellitus and Standards of Medical Care Managing diabetes (diabetes mellitus) can be complicated. Your diabetes treatment may be managed by a team of health care providers, including:  A diet and nutrition specialist (registered dietitian).  A nurse.  A certified diabetes educator (CDE).  A diabetes specialist (endocrinologist).  An eye doctor.  A primary care provider.  A dentist.  Your health care providers follow a schedule in order to help you get the best quality of care. The following schedule is a general guideline for your diabetes management plan. Your health care providers may also give you more specific instructions. HbA1c ( hemoglobin A1c) test This test provides information about blood sugar (glucose) control over the previous 2-3 months. It is used to check whether your diabetes management plan needs to be adjusted.  If you are meeting your treatment goals, this test is done at least 2 times a year.  If you are not meeting treatment goals or if your treatment goals have changed, this test is done 4 times a year.  Blood pressure test  This test is done at every routine medical visit. For most people, the goal is less than 130/80. Ask your health care provider what your goal blood pressure should be. Dental and eye exams  Visit your dentist two times a year.  If you have type 1 diabetes, get an eye exam 3-5 years after you are diagnosed, and then once a year after your first exam. ? If you were diagnosed with type 1 diabetes as a child, get an eye exam when you are age 49 or older and have had diabetes for 3-5 years. After the first exam, you should get an eye exam once a year.  If you have type 2 diabetes, have an eye exam as soon as you are diagnosed, and then once a year after  your first exam. Foot care exam  Visual foot exams are done at every routine medical visit. The exams check for cuts, bruises, redness, blisters, sores, or other problems with the feet.  A complete foot exam is done by your health care provider once a year. This exam includes an inspection of the structure and skin of your feet, and a check of the pulses and sensation in your feet. ? Type 1 diabetes: Get your first exam 3-5 years after diagnosis. ? Type 2 diabetes: Get your first exam as soon as you are diagnosed.  Check your feet every day for cuts, bruises, redness, blisters, or sores. If you have any of these or other problems that are not healing, contact your health care provider. Kidney function test ( urine microalbumin)  This test is done once a year. ? Type 1 diabetes: Get your first test 5 years after diagnosis. ? Type 2 diabetes: Get your first test as soon as you are diagnosed.  If you have chronic kidney disease (CKD), get a serum creatinine and estimated glomerular filtration rate (eGFR) test once a year. Lipid profile (cholesterol, HDL, LDL, triglycerides)  This test should be done when you are diagnosed with diabetes, and every 5 years after the first test. If you are on medicines to lower your cholesterol, you may need to get this test done every year. ? The goal for LDL is less than 100  mg/dL (5.5 mmol/L). If you are at high risk, the goal is less than 70 mg/dL (3.9 mmol/L). ? The goal for HDL is 40 mg/dL (2.2 mmol/L) for men and 50 mg/dL(2.8 mmol/L) for women. An HDL cholesterol of 60 mg/dL (3.3 mmol/L) or higher gives some protection against heart disease. ? The goal for triglycerides is less than 150 mg/dL (8.3 mmol/L). Immunizations  The yearly flu (influenza) vaccine is recommended for everyone 6 months or older who has diabetes.  The pneumonia (pneumococcal) vaccine is recommended for everyone 2 years or older who has diabetes. If you are 45 or older, you may get  the pneumonia vaccine as a series of two separate shots.  The hepatitis B vaccine is recommended for adults shortly after they have been diagnosed with diabetes.  The Tdap (tetanus, diphtheria, and pertussis) vaccine should be given: ? According to normal childhood vaccination schedules, for children. ? Every 10 years, for adults who have diabetes.  The shingles vaccine is recommended for people who have had chicken pox and are 50 years or older. Mental and emotional health  Screening for symptoms of eating disorders, anxiety, and depression is recommended at the time of diagnosis and afterward as needed. If your screening shows that you have symptoms (you have a positive screening result), you may need further evaluation and be referred to a mental health care provider. Diabetes self-management education  Education about how to manage your diabetes is recommended at diagnosis and ongoing as needed. Treatment plan  Your treatment plan will be reviewed at every medical visit. Summary  Managing diabetes (diabetes mellitus) can be complicated. Your diabetes treatment may be managed by a team of health care providers.  Your health care providers follow a schedule in order to help you get the best quality of care.  Standards of care including having regular physical exams, blood tests, blood pressure monitoring, immunizations, screening tests, and education about how to manage your diabetes.  Your health care providers may also give you more specific instructions based on your individual health. This information is not intended to replace advice given to you by your health care provider. Make sure you discuss any questions you have with your health care provider. Document Released: 01/30/2009 Document Revised: 01/01/2016 Document Reviewed: 01/01/2016 Elsevier Interactive Patient Education  [employer].

## 2017-11-09 NOTE — Progress Notes (Signed)
Subjective:  Patient ID: Tiffany Wells, female    DOB: [birth year]  Age: 62 y.o. MRN: [id number]  CC: The primary encounter diagnosis was Essential hypertension. Diagnoses of Type 2 diabetes mellitus without complication, without long-term current use of insulin ([place]), Pure hypercholesterolemia, Postmenopausal estrogen deficiency, Breast cancer screening, and Irritable bowel syndrome with diarrhea were also pertinent to this visit.  HPI Tiffany Wells presents for follow up on newly diagnosed type 2 DM,  Diet controlled since April.  She has lost 10 lbs since June   By giving up sodas and sweet teas .  She has noted an improvement in her fasting blood sugars which have dropped from 250-300 to 150 . 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  .  Patient has had an eye exam in the last 12 months and checks feet regularly for signs of infection.  Patient does not walk barefoot outside,  And denies an numbness tingling or burning in feet. Patient is up to date on all recommended vaccinations  NO UTIS SINCE STARTING VAGINAL ESTROGEN      Lab Results  Component Value Date   HGBA1C 7.1 (H) 06/15/2017     Outpatient Medications Prior to Visit  Medication Sig Dispense Refill  . cyclobenzaprine (FLEXERIL) 10 MG tablet Take 10 mg by mouth daily.    [name] dicyclomine (BENTYL) 10 MG capsule Take 10 mg by mouth daily.     [name] diltiazem (CARDIZEM) 30 MG tablet Take 30 mg by mouth daily as needed (SVT).     [name] diltiazem (CARTIA XT) 240 MG 24 hr capsule Take 240 mg by mouth daily.    . diphenhydrAMINE (BENADRYL) 25 MG tablet As needed for allergies.     . hyoscyamine (LEVSIN SL) 0.125 MG SL tablet Place 0.125 mg under the tongue every 4 (four) hours as needed for cramping.    . metoprolol succinate (TOPROL-XL) 50 MG 24 hr tablet Take 50 mg by mouth 2 (two) times daily. Take with or immediately following a meal.    .  omeprazole (PRILOSEC OTC) 20 MG tablet Take 20 mg by mouth daily.      . Vitamin D, Ergocalciferol, (DRISDOL) 50000 units CAPS capsule TAKE 1 CAPSULE (50,000 UNITS TOTAL) BY MOUTH ONCE A WEEK. 12 capsule 0  . estradiol (ESTRACE) 0.1 MG/GM vaginal cream PLACE 1/4 APPLICATORFUL VAGINALLY DAILY. 42.5 g 11   No facility-administered medications prior to visit.     Review of Systems;  Patient denies headache, fevers, malaise, unintentional weight loss, skin rash, eye pain, sinus congestion and sinus pain, sore throat, dysphagia,  hemoptysis , cough, dyspnea, wheezing, chest pain, palpitations, orthopnea, edema, abdominal pain, nausea, melena, diarrhea, constipation, flank pain, dysuria, hematuria, urinary  Frequency, nocturia, numbness, tingling, seizures,  Focal weakness, Loss of consciousness,  Tremor, insomnia, depression, anxiety, and suicidal ideation.      Objective:  BP 120/72 (BP Location: Left Arm, Patient Position: Sitting, Cuff Size: Large)   Pulse 78   Temp 98.1 F (36.7 C) (Oral)   Resp 15   Ht 5\' 8"  (1.727 m)   Wt 293 lb 1.9 oz (133 kg)   SpO2 97%   BMI 44.57 kg/m   BP Readings from Last 3 Encounters:  11/09/17 120/72  07/28/17 128/70  07/21/17 126/74    Wt Readings from Last 3 Encounters:  11/09/17 293 lb 1.9 oz (133 kg)  07/28/17 (!) 303 lb (137.4  kg)  07/21/17 300 lb (136.1 kg)    General appearance: alert, cooperative and appears stated age Ears: normal TM's and external ear canals both ears Throat: lips, mucosa, and tongue normal; teeth and gums normal Neck: no adenopathy, no carotid bruit, supple, symmetrical, trachea midline and thyroid not enlarged, symmetric, no tenderness/mass/nodules Back: symmetric, no curvature. ROM normal. No CVA tenderness. Lungs: clear to auscultation bilaterally Heart: regular rate and rhythm, S1, S2 normal, no murmur, click, rub or gallop Abdomen: soft, non-tender; bowel sounds normal; no masses,  no organomegaly Pulses: 2+ and  symmetric Skin: Skin color, texture, turgor normal. No rashes or lesions Lymph nodes: Cervical, supraclavicular, and axillary nodes normal.  Lab Results  Component Value Date   HGBA1C 7.1 (H) 06/15/2017   HGBA1C 5.9 10/12/2015    Lab Results  Component Value Date   CREATININE 0.83 06/15/2017   CREATININE 0.83 10/12/2015   CREATININE 0.88 02/15/2014    Lab Results  Component Value Date   WBC 6.8 06/15/2017   HGB 13.7 06/15/2017   HCT 40.3 06/15/2017   PLT 186.0 06/15/2017   GLUCOSE 144 (H) 06/15/2017   CHOL 191 06/15/2017   TRIG 184.0 (H) 06/15/2017   HDL 35.40 (L) 06/15/2017   LDLDIRECT 106.0 10/12/2015   LDLCALC 118 (H) 06/15/2017   ALT 19 06/15/2017   AST 18 06/15/2017   NA 141 06/15/2017   K 4.3 06/15/2017   CL 103 06/15/2017   CREATININE 0.83 06/15/2017   BUN 14 06/15/2017   CO2 30 06/15/2017   TSH 4.86 (H) 06/15/2017   INR 1.1 02/13/2014   HGBA1C 7.1 (H) 06/15/2017    Nm Myocar Multi W/spect W/wall Motion / Ef  Result Date: 06/19/2017  Blood pressure demonstrated a normal response to exercise.  There was no ST segment deviation noted during stress.  No T wave inversion was noted during stress.  Normal wall motion  Ejection fraction 71%  The study is normal.  This is a low risk study.  The left ventricular ejection fraction is hyperdynamic (>65%).  Nuclear stress EF: 71%.  Conclusion Normal myocardial perfusion scan No Evidence of stress-induced medical ischemia Ejection fraction of 71% Low risk scan    Assessment & Plan:   Problem List Items Addressed This Visit    Type 2 diabetes mellitus without complications ([place])    Currently managed with dietary changes alone.  Ditary changes were initiated in June,  She will return in September for labs       Relevant Orders   Lipid panel   Hemoglobin A1c   IBS (irritable bowel syndrome)    Diarrhea predominant. Recommending probiotics      Essential hypertension - Primary   Relevant Orders    Comprehensive metabolic panel    Other Visit Diagnoses    Pure hypercholesterolemia       Relevant Orders   TSH   Postmenopausal estrogen deficiency       Relevant Orders   DG Bone Density   Breast cancer screening       Relevant Orders   MM 3D SCREEN BREAST BILATERAL    A total of 25 minutes of face to face time was spent with patient more than half of which was spent in counselling about the above mentioned conditions  and coordination of care  I have changed Shahrzad C. Grealish's estradiol. I am also having her maintain her diphenhydrAMINE, omeprazole, dicyclomine, diltiazem, diltiazem, hyoscyamine, metoprolol succinate, cyclobenzaprine, and Vitamin D (Ergocalciferol).  Meds ordered this encounter  Medications  . estradiol (ESTRACE) 0.1 MG/GM vaginal cream    Sig: PLACE 1/4 APPLICATORFUL  VAGINALLY DAILY.    Dispense:  42.5 g    Refill:  11    Medications Discontinued During This Encounter  Medication Reason  . estradiol (ESTRACE) 0.1 MG/GM vaginal cream Reorder    Follow-up: Return in about 6 months (around 05/12/2018) for fasting labs in september .   Crecencio Mc, MD

## 2017-11-11 NOTE — Assessment & Plan Note (Signed)
Her previous  elevations were due to daily use of nonsteroidal anti-inflammatories . Continue current medications  Lab Results  Component Value Date   CREATININE 0.83 06/15/2017   No results found for: Derl Barrow

## 2017-11-11 NOTE — Assessment & Plan Note (Addendum)
Diarrhea predominant. Recommending probiotics

## 2017-11-11 NOTE — Assessment & Plan Note (Signed)
Currently managed with dietary changes alone.  Ditary changes were initiated in June,  She will return in September for labs

## 2017-12-11 ENCOUNTER — Other Ambulatory Visit: Payer: Self-pay | Admitting: Internal Medicine

## 2017-12-11 NOTE — Telephone Encounter (Signed)
Refilled: 12/05/2016 Last OV: 11/09/2017 Next OV: 05/18/2018

## 2017-12-19 ENCOUNTER — Encounter: Payer: Self-pay | Admitting: Internal Medicine

## 2017-12-31 ENCOUNTER — Other Ambulatory Visit: Payer: Self-pay | Admitting: Internal Medicine

## 2017-12-31 DIAGNOSIS — N390 Urinary tract infection, site not specified: Secondary | ICD-10-CM

## 2017-12-31 MED ORDER — NITROFURANTOIN MONOHYD MACRO 100 MG PO CAPS: 100.0000 mg | ORAL_CAPSULE | Freq: Two times a day (BID) | ORAL | 0 refills | Status: DC

## 2017-12-31 MED ORDER — SCOPOLAMINE 1 MG/3DAYS TD PT72: 1.0000 | MEDICATED_PATCH | TRANSDERMAL | 0 refills | Status: DC

## 2018-05-18 ENCOUNTER — Ambulatory Visit: Payer: 59 | Admitting: Internal Medicine

## 2018-06-09 LAB — LIPID PANEL
Cholesterol: 228 — AB (ref 0–200)
HDL: 47 (ref 35–70)
LDL Cholesterol: 154
LDl/HDL Ratio: 3.3
Triglycerides: 131 (ref 40–160)

## 2018-06-09 LAB — BASIC METABOLIC PANEL
BUN: 14 (ref 4–21)
Creatinine: 0.9 (ref 0.5–1.1)

## 2018-06-09 LAB — HEPATIC FUNCTION PANEL
ALT: 16 (ref 7–35)
AST: 17 (ref 13–35)
Alkaline Phosphatase: 95 (ref 25–125)
Bilirubin, Total: 0.3

## 2018-06-09 LAB — HEMOGLOBIN A1C: Hemoglobin A1C: 6.5

## 2018-06-15 DIAGNOSIS — I471 Supraventricular tachycardia: Secondary | ICD-10-CM | POA: Diagnosis not present

## 2018-06-22 ENCOUNTER — Encounter: Payer: Self-pay | Admitting: Internal Medicine

## 2018-06-22 ENCOUNTER — Ambulatory Visit (INDEPENDENT_AMBULATORY_CARE_PROVIDER_SITE_OTHER): Payer: 59 | Admitting: Internal Medicine

## 2018-06-22 VITALS — BP 110/70 | HR 88 | Temp 97.6°F | Resp 15 | Ht 68.0 in | Wt 303.4 lb

## 2018-06-22 DIAGNOSIS — R7989 Other specified abnormal findings of blood chemistry: Secondary | ICD-10-CM

## 2018-06-22 DIAGNOSIS — Z1239 Encounter for other screening for malignant neoplasm of breast: Secondary | ICD-10-CM

## 2018-06-22 DIAGNOSIS — E119 Type 2 diabetes mellitus without complications: Secondary | ICD-10-CM

## 2018-06-22 DIAGNOSIS — R946 Abnormal results of thyroid function studies: Secondary | ICD-10-CM

## 2018-06-22 DIAGNOSIS — I10 Essential (primary) hypertension: Secondary | ICD-10-CM | POA: Diagnosis not present

## 2018-06-22 DIAGNOSIS — E559 Vitamin D deficiency, unspecified: Secondary | ICD-10-CM | POA: Diagnosis not present

## 2018-06-22 DIAGNOSIS — Z0000 Encounter for general adult medical examination without abnormal findings: Secondary | ICD-10-CM

## 2018-06-22 DIAGNOSIS — Z23 Encounter for immunization: Secondary | ICD-10-CM | POA: Diagnosis not present

## 2018-06-22 LAB — TSH: TSH: 5.14 u[IU]/mL — ABNORMAL HIGH (ref 0.35–4.50)

## 2018-06-22 LAB — VITAMIN D 25 HYDROXY (VIT D DEFICIENCY, FRACTURES): VITD: 29.13 ng/mL — ABNORMAL LOW (ref 30.00–100.00)

## 2018-06-22 NOTE — Progress Notes (Signed)
Patient ID: Tiffany Wells, female    DOB: 12/17/1955  Age: 63 y.o. MRN: [id number]  The patient is here for annual preventive examination and management of other chronic and acute problems.  ASCUS in  March 2017. PAP smears previously done by Dr.  Ammie Dalton Colonoscopy done in 2018 [place],  Normal  Mammogram normal in  2017 done at [place]. Overdue , discussed getting them done by [facility]   Saw Fath Feb 2020  [facility] preferred for [facility]    The risk factors are reflected in the social history.  The roster of all physicians providing medical care to patient - is listed in the Snapshot section of the chart.  Activities of daily living:  The patient is 100% independent in all ADLs: dressing, toileting, feeding as well as independent mobility  Home safety : The patient has smoke detectors in the home. They wear seatbelts.  There are no firearms at home. There is no violence in the home.   There is no risks for hepatitis, STDs or HIV. There is no   history of blood transfusion. They have no travel history to infectious disease endemic areas of the world.  The patient has seen their dentist in the last six month. They have seen their eye doctor in the last year. They deny hearing difficulty with regard to whispered voices and some television programs.  They have deferred audiologic testing in the last year.  They do not  have excessive sun exposure. Discussed the need for sun protection: hats, long sleeves and use of sunscreen if there is significant sun exposure.   Diet: the importance of a healthy diet is discussed. They do have a healthy diet.  The benefits of regular aerobic exercise were discussed. She has not been exercising .   Depression screen: there are no signs or vegative symptoms of depression- irritability, change in appetite, anhedonia, sadness/tearfullness.   The following portions of the patient's history were reviewed and updated as appropriate:  allergies, current medications, past family history, past medical history,  past surgical history, past social history  and problem list.  Visual acuity was not assessed per patient preference since she has regular follow up with her ophthalmologist. Hearing and body mass index were assessed and reviewed.   During the course of the visit the patient was educated and counseled about appropriate screening and preventive services including : fall prevention , diabetes screening, nutrition counseling, colorectal cancer screening, and recommended immunizations.    CC: The primary encounter diagnosis was Vitamin D deficiency. Diagnoses of Abnormal thyroid function test, Need for 23-polyvalent pneumococcal polysaccharide vaccine, Breast cancer screening, Essential hypertension, and Morbid obesity ([place]) were also pertinent to this visit.  History Tiffany Wells has a past medical history of Acute pulmonary embolism ([place]) (08/06/2013), Arthritis, Back pain, DVT (deep venous thrombosis) ([place]) (12/28/2016), Dysrhythmia (12/28/2016), Headache, History of Salmonella gastroenteritis (2012), Hypertension, IBS (irritable bowel syndrome), Internal hemorrhoids, Ischemic colitis ([place]) (02/21/2014), Normal cardiac stress test, and Pulmonary embolism ([place]).   She has a past surgical history that includes Rotator cuff repair; Tubal ligation; Colonoscopy (03/07/2011); Hysteroscopy; Upper gastrointestinal endoscopy (03/07/2011); Joint replacement; and Colonoscopy with propofol (N/A, 02/20/2017).   Her family history includes Arthritis in her sister; Cancer in her brother, father, maternal grandmother, and mother; Diabetes in her mother; Heart disease (age of onset: 55) in her father; Hypertension in her father, mother, and sister.She reports that she has never smoked. She has never used smokeless tobacco. She reports current  alcohol use of about 2.0 standard drinks of alcohol per week. She reports that she does not use  drugs.  Outpatient Medications Prior to Visit  Medication Sig Dispense Refill  . cyclobenzaprine (FLEXERIL) 10 MG tablet Take 10 mg by mouth daily.    [name] dicyclomine (BENTYL) 10 MG capsule Take 10 mg by mouth daily.     [name] diltiazem (CARDIZEM) 30 MG tablet Take 30 mg by mouth daily as needed (SVT).     [name] diltiazem (CARTIA XT) 240 MG 24 hr capsule Take 240 mg by mouth daily.    . diphenhydrAMINE (BENADRYL) 25 MG tablet As needed for allergies.     [name] estradiol (ESTRACE) 0.1 MG/GM vaginal cream PLACE 1/4 APPLICATORFUL  VAGINALLY DAILY. 42.5 g 11  . fluconazole (DIFLUCAN) 150 MG tablet TAKE 1 TABLET (150 MG TOTAL) BY MOUTH DAILY FOR 2 DAYS 2 tablet 3  . hyoscyamine (LEVSIN SL) 0.125 MG SL tablet Place 0.125 mg under the tongue every 4 (four) hours as needed for cramping.    . metoprolol succinate (TOPROL-XL) 50 MG 24 hr tablet Take 50 mg by mouth daily. Take with or immediately following a meal.     . omeprazole (PRILOSEC OTC) 20 MG tablet Take 20 mg by mouth daily.      . Vitamin D, Ergocalciferol, (DRISDOL) 50000 units CAPS capsule TAKE 1 CAPSULE (50,000 UNITS TOTAL) BY MOUTH ONCE A WEEK. 12 capsule 0  . nitrofurantoin, macrocrystal-monohydrate, (MACROBID) 100 MG capsule Take 1 capsule (100 mg total) by mouth 2 (two) times daily. 14 capsule 0  . scopolamine (TRANSDERM-SCOP, 1.5 MG,) 1 MG/3DAYS Place 1 patch (1.5 mg total) onto the skin every 3 (three) days. 10 patch 0   No facility-administered medications prior to visit.     Review of Systems   Patient denies headache, fevers, malaise, unintentional weight loss, skin rash, eye pain, sinus congestion and sinus pain, sore throat, dysphagia,  hemoptysis , cough, dyspnea, wheezing, chest pain, palpitations, orthopnea, edema, abdominal pain, nausea, melena, diarrhea, constipation, flank pain, dysuria, hematuria, urinary  Frequency, nocturia, numbness, tingling, seizures,  Focal weakness, Loss of consciousness,  Tremor, insomnia, depression, anxiety,  and suicidal ideation.      Objective:  BP 110/70 (BP Location: Left Arm, Patient Position: Sitting, Cuff Size: Large)   Pulse 88   Temp 97.6 F (36.4 C) (Oral)   Resp 15   Ht 5\' 8"  (1.727 m)   Wt (!) 303 lb 6.4 oz (137.6 kg)   SpO2 98%   BMI 46.13 kg/m   Physical Exam   General appearance: alert, cooperative and appears stated age Head: Normocephalic, without obvious abnormality, atraumatic Eyes: conjunctivae/corneas clear. PERRL, EOM's intact. Fundi benign. Ears: normal TM's and external ear canals both ears Nose: Nares normal. Septum midline. Mucosa normal. No drainage or sinus tenderness. Throat: lips, mucosa, and tongue normal; teeth and gums normal Neck: no adenopathy, no carotid bruit, no JVD, supple, symmetrical, trachea midline and thyroid not enlarged, symmetric, no tenderness/mass/nodules Lungs: clear to auscultation bilaterally Breasts: normal appearance, no masses or tenderness Heart: regular rate and rhythm, S1, S2 normal, no murmur, click, rub or gallop Abdomen: soft, non-tender; bowel sounds normal; no masses,  no organomegaly Extremities: extremities normal, atraumatic, no cyanosis or edema Pulses: 2+ and symmetric Skin: Skin color, texture, turgor normal. No rashes or lesions Neurologic: Alert and oriented X 3, normal strength and tone. Normal symmetric reflexes. Normal coordination and gait.      Assessment & Plan:   Problem List Items  Addressed This Visit    Essential hypertension    Well controlled on metoprolol 50  Mg and diltiazem 240. She is overdue for assessment of renal function .  no changes today.       Morbid obesity ([place])    I have addressed  BMI and recommended wt loss of 10% of body weight over the next 6 months using a low glycemic index diet and regular exercise a minimum of 5 days per week.         Other Visit Diagnoses    Vitamin D deficiency    -  Primary   Relevant Orders   VITAMIN D 25 Hydroxy (Vit-D Deficiency, Fractures)  (Completed)   Abnormal thyroid function test       Relevant Orders   TSH (Completed)   Need for 23-polyvalent pneumococcal polysaccharide vaccine       Relevant Orders   Pneumococcal polysaccharide vaccine 23-valent greater than or equal to 2yo subcutaneous/IM (Completed)   Breast cancer screening       Relevant Orders   MM DIGITAL SCREENING BILATERAL      I have discontinued Mickey C. Zappia's nitrofurantoin (macrocrystal-monohydrate) and scopolamine. I am also having her maintain her diphenhydrAMINE, omeprazole, dicyclomine, diltiazem, diltiazem, hyoscyamine, metoprolol succinate, cyclobenzaprine, Vitamin D (Ergocalciferol), estradiol, and fluconazole.  No orders of the defined types were placed in this encounter.   Medications Discontinued During This Encounter  Medication Reason  . nitrofurantoin, macrocrystal-monohydrate, (MACROBID) 100 MG capsule Completed Course  . scopolamine (TRANSDERM-SCOP, 1.5 MG,) 1 MG/3DAYS Patient has not taken in last 30 days    Follow-up: Return in about 2 months (around 08/22/2018) for for Shingrx vaccine.   Crecencio Mc, MD

## 2018-06-22 NOTE — Patient Instructions (Signed)
Good to see you  i'll evaluate your risk for CAD using your lipid panel and let you know that I think   Return for Shingrx vaccine in April (RN visit )    Health Maintenance for Postmenopausal Women Menopause is a normal process in which your reproductive ability comes to an end. This process happens gradually over a span of months to years, usually between the ages of 67 and 59. Menopause is complete when you have missed 12 consecutive menstrual periods. It is important to talk with your health care provider about some of the most common conditions that affect postmenopausal women, such as heart disease, cancer, and bone loss (osteoporosis). Adopting a healthy lifestyle and getting preventive care can help to promote your health and wellness. Those actions can also lower your chances of developing some of these common conditions. What should I know about menopause? During menopause, you may experience a number of symptoms, such as:  Moderate-to-severe hot flashes.  Night sweats.  Decrease in sex drive.  Mood swings.  Headaches.  Tiredness.  Irritability.  Memory problems.  Insomnia. Choosing to treat or not to treat menopausal changes is an individual decision that you make with your health care provider. What should I know about hormone replacement therapy and supplements? Hormone therapy products are effective for treating symptoms that are associated with menopause, such as hot flashes and night sweats. Hormone replacement carries certain risks, especially as you become older. If you are thinking about using estrogen or estrogen with progestin treatments, discuss the benefits and risks with your health care provider. What should I know about heart disease and stroke? Heart disease, heart attack, and stroke become more likely as you age. This may be due, in part, to the hormonal changes that your body experiences during menopause. These can affect how your body processes dietary  fats, triglycerides, and cholesterol. Heart attack and stroke are both medical emergencies. There are many things that you can do to help prevent heart disease and stroke:  Have your blood pressure checked at least every 1-2 years. High blood pressure causes heart disease and increases the risk of stroke.  If you are 82-12 years old, ask your health care provider if you should take aspirin to prevent a heart attack or a stroke.  Do not use any tobacco products, including cigarettes, chewing tobacco, or electronic cigarettes. If you need help quitting, ask your health care provider.  It is important to eat a healthy diet and maintain a healthy weight. ? Be sure to include plenty of vegetables, fruits, low-fat dairy products, and lean protein. ? Avoid eating foods that are high in solid fats, added sugars, or salt (sodium).  Get regular exercise. This is one of the most important things that you can do for your health. ? Try to exercise for at least 150 minutes each week. The type of exercise that you do should increase your heart rate and make you sweat. This is known as moderate-intensity exercise. ? Try to do strengthening exercises at least twice each week. Do these in addition to the moderate-intensity exercise.  Know your numbers.Ask your health care provider to check your cholesterol and your blood glucose. Continue to have your blood tested as directed by your health care provider.  What should I know about cancer screening? There are several types of cancer. Take the following steps to reduce your risk and to catch any cancer development as early as possible. Breast Cancer  Practice breast self-awareness. ? This  means understanding how your breasts normally appear and feel. ? It also means doing regular breast self-exams. Let your health care provider know about any changes, no matter how small.  If you are 37 or older, have a clinician do a breast exam (clinical breast exam or  CBE) every year. Depending on your age, family history, and medical history, it may be recommended that you also have a yearly breast X-ray (mammogram).  If you have a family history of breast cancer, talk with your health care provider about genetic screening.  If you are at high risk for breast cancer, talk with your health care provider about having an MRI and a mammogram every year.  Breast cancer (BRCA) gene test is recommended for women who have family members with BRCA-related cancers. Results of the assessment will determine the need for genetic counseling and BRCA1 and for BRCA2 testing. BRCA-related cancers include these types: ? Breast. This occurs in males or females. ? Ovarian. ? Tubal. This may also be called fallopian tube cancer. ? Cancer of the abdominal or pelvic lining (peritoneal cancer). ? Prostate. ? Pancreatic. Cervical, Uterine, and Ovarian Cancer Your health care provider may recommend that you be screened regularly for cancer of the pelvic organs. These include your ovaries, uterus, and vagina. This screening involves a pelvic exam, which includes checking for microscopic changes to the surface of your cervix (Pap test).  For women ages 21-65, health care providers may recommend a pelvic exam and a Pap test every three years. For women ages 54-65, they may recommend the Pap test and pelvic exam, combined with testing for human papilloma virus (HPV), every five years. Some types of HPV increase your risk of cervical cancer. Testing for HPV may also be done on women of any age who have unclear Pap test results.  Other health care providers may not recommend any screening for nonpregnant women who are considered low risk for pelvic cancer and have no symptoms. Ask your health care provider if a screening pelvic exam is right for you.  If you have had past treatment for cervical cancer or a condition that could lead to cancer, you need Pap tests and screening for cancer for  at least 20 years after your treatment. If Pap tests have been discontinued for you, your risk factors (such as having a new sexual partner) need to be reassessed to determine if you should start having screenings again. Some women have medical problems that increase the chance of getting cervical cancer. In these cases, your health care provider may recommend that you have screening and Pap tests more often.  If you have a family history of uterine cancer or ovarian cancer, talk with your health care provider about genetic screening.  If you have vaginal bleeding after reaching menopause, tell your health care provider.  There are currently no reliable tests available to screen for ovarian cancer. Lung Cancer Lung cancer screening is recommended for adults 45-36 years old who are at high risk for lung cancer because of a history of smoking. A yearly low-dose CT scan of the lungs is recommended if you:  Currently smoke.  Have a history of at least 30 pack-years of smoking and you currently smoke or have quit within the past 15 years. A pack-year is smoking an average of one pack of cigarettes per day for one year. Yearly screening should:  Continue until it has been 15 years since you quit.  Stop if you develop a health problem that  would prevent you from having lung cancer treatment. Colorectal Cancer  This type of cancer can be detected and can often be prevented.  Routine colorectal cancer screening usually begins at age 79 and continues through age 56.  If you have risk factors for colon cancer, your health care provider may recommend that you be screened at an earlier age.  If you have a family history of colorectal cancer, talk with your health care provider about genetic screening.  Your health care provider may also recommend using home test kits to check for hidden blood in your stool.  A small camera at the end of a tube can be used to examine your colon directly  (sigmoidoscopy or colonoscopy). This is done to check for the earliest forms of colorectal cancer.  Direct examination of the colon should be repeated every 5-10 years until age 38. However, if early forms of precancerous polyps or small growths are found or if you have a family history or genetic risk for colorectal cancer, you may need to be screened more often. Skin Cancer  Check your skin from head to toe regularly.  Monitor any moles. Be sure to tell your health care provider: ? About any new moles or changes in moles, especially if there is a change in a mole's shape or color. ? If you have a mole that is larger than the size of a pencil eraser.  If any of your family members has a history of skin cancer, especially at a young age, talk with your health care provider about genetic screening.  Always use sunscreen. Apply sunscreen liberally and repeatedly throughout the day.  Whenever you are outside, protect yourself by wearing long sleeves, pants, a wide-brimmed hat, and sunglasses. What should I know about osteoporosis? Osteoporosis is a condition in which bone destruction happens more quickly than new bone creation. After menopause, you may be at an increased risk for osteoporosis. To help prevent osteoporosis or the bone fractures that can happen because of osteoporosis, the following is recommended:  If you are 17-29 years old, get at least 1,000 mg of calcium and at least 600 mg of vitamin D per day.  If you are older than age 43 but younger than age 15, get at least 1,200 mg of calcium and at least 600 mg of vitamin D per day.  If you are older than age 28, get at least 1,200 mg of calcium and at least 800 mg of vitamin D per day. Smoking and excessive alcohol intake increase the risk of osteoporosis. Eat foods that are rich in calcium and vitamin D, and do weight-bearing exercises several times each week as directed by your health care provider. What should I know about how  menopause affects my mental health? Depression may occur at any age, but it is more common as you become older. Common symptoms of depression include:  Low or sad mood.  Changes in sleep patterns.  Changes in appetite or eating patterns.  Feeling an overall lack of motivation or enjoyment of activities that you previously enjoyed.  Frequent crying spells. Talk with your health care provider if you think that you are experiencing depression. What should I know about immunizations? It is important that you get and maintain your immunizations. These include:  Tetanus, diphtheria, and pertussis (Tdap) booster vaccine.  Influenza every year before the flu season begins.  Pneumonia vaccine.  Shingles vaccine. Your health care provider may also recommend other immunizations. This information is not intended to  replace advice given to you by your health care provider. Make sure you discuss any questions you have with your health care provider. Document Released: 05/27/2005 Document Revised: 10/23/2015 Document Reviewed: 01/06/2015 Elsevier Interactive Patient Education  2019 [employer].

## 2018-06-24 NOTE — Assessment & Plan Note (Addendum)
Currently managed with dietary changes alone.    She is overdue for follow up  And labs

## 2018-06-24 NOTE — Assessment & Plan Note (Signed)
She has had a ten lb weight gain and tsh is still elevated  Will consider treating with 25 mcg dose

## 2018-06-24 NOTE — Assessment & Plan Note (Signed)
I have addressed  BMI and recommended wt loss of 10% of body weight over the next 6 months using a low glycemic index diet and regular exercise a minimum of 5 days per week.   

## 2018-06-24 NOTE — Assessment & Plan Note (Signed)
Well controlled on metoprolol 50  Mg and diltiazem 240. She is overdue for assessment of renal function .  no changes today.

## 2018-06-24 NOTE — Assessment & Plan Note (Signed)

## 2018-06-25 ENCOUNTER — Telehealth: Payer: Self-pay | Admitting: Internal Medicine

## 2018-06-27 ENCOUNTER — Other Ambulatory Visit: Payer: Self-pay | Admitting: Internal Medicine

## 2018-06-27 DIAGNOSIS — E039 Hypothyroidism, unspecified: Secondary | ICD-10-CM

## 2018-06-27 DIAGNOSIS — R7989 Other specified abnormal findings of blood chemistry: Secondary | ICD-10-CM

## 2018-06-27 MED ORDER — LEVOTHYROXINE SODIUM 25 MCG PO TABS: 25.0000 ug | ORAL_TABLET | Freq: Every day | ORAL | 1 refills | Status: DC

## 2018-06-29 ENCOUNTER — Other Ambulatory Visit: Payer: Self-pay

## 2018-08-02 ENCOUNTER — Ambulatory Visit (INDEPENDENT_AMBULATORY_CARE_PROVIDER_SITE_OTHER): Payer: 59 | Admitting: Internal Medicine

## 2018-08-02 ENCOUNTER — Other Ambulatory Visit: Payer: Self-pay

## 2018-08-02 ENCOUNTER — Other Ambulatory Visit: Payer: Self-pay | Admitting: Internal Medicine

## 2018-08-02 ENCOUNTER — Encounter: Payer: Self-pay | Admitting: Internal Medicine

## 2018-08-02 DIAGNOSIS — E785 Hyperlipidemia, unspecified: Secondary | ICD-10-CM

## 2018-08-02 DIAGNOSIS — E039 Hypothyroidism, unspecified: Secondary | ICD-10-CM | POA: Diagnosis not present

## 2018-08-02 DIAGNOSIS — E1169 Type 2 diabetes mellitus with other specified complication: Secondary | ICD-10-CM

## 2018-08-02 DIAGNOSIS — G4452 New daily persistent headache (NDPH): Secondary | ICD-10-CM

## 2018-08-02 DIAGNOSIS — I471 Supraventricular tachycardia: Secondary | ICD-10-CM

## 2018-08-02 MED FILL — ESTRADIOL 0.1 MG/GM CREA: 0.1 | 30 days supply | Qty: 43 | Fill #0

## 2018-08-02 MED FILL — FLUCONAZOLE 150 MG TABS: 150 | 2 days supply | Qty: 2 | Fill #0

## 2018-08-02 MED FILL — HYDROCORTISONE-IODOQUINOL 1: 1-1 | 10 days supply | Qty: 28 | Fill #0

## 2018-08-02 MED FILL — DICYCLOMINE 10 MG CAPSULE: 10 | 30 days supply | Qty: 60 | Fill #0

## 2018-08-02 MED FILL — METOPROLOL SUCC ER 50 MG TA: 50 | 90 days supply | Qty: 180 | Fill #0

## 2018-08-02 NOTE — Progress Notes (Signed)
Virtual Visit via [email or web address]  This visit type was conducted due to national recommendations for restrictions regarding the COVID-19 pandemic (e.g. social distancing).  This format is felt to be most appropriate for this patient at this time.  All issues noted in this document were discussed and addressed.  No physical exam was performed (except for noted visual exam findings with Video Visits).   I connected with@ on 08/02/18 at 10:00 AM EDT by a video enabled telemedicine application or telephone and verified that I am speaking with the correct person using two identifiers. Location patient: home Location provider: work or home office Persons participating in the virtual visit: patient, provider  I discussed the limitations, risks, security and privacy concerns of performing an evaluation and management service by telephone and the availability of in person appointments. I also discussed with the patient that there may be a patient responsible charge related to this service. The patient expressed understanding and agreed to proceed.  Reason for visit: follow up on recently diagnosed hypothyroidism,  Type 2 DM,   Chronic SVT   HPI:  1) new onset daily headache .  Starts in  Late morning,  Worse by later afternoon.  Most resolve spontaneously after she goes home and relaxes,  Has been present for the past 3 weeks.  No vision changes,  Dizziness,  Diaphoresis or slowed cognition.  Worse with barometric changes (Monday's storm).   2) Hypothyroidism : she started taking 25 mcg levothyroxine in early March for persistently mildly elevated TSH acc'd by weight gain/inability to lose weight..  Since starting the medication she has noted an increase in HR and has had to increase metoprolol succinate from  50 mg to 100 mg daily.  She is not exercising and still working 40+ hours per week.   Cholesterol elevated last month,   cmet normal,    a1c 6.5 .     ROS: See pertinent positives and negatives per  HPI.  Past Medical History:  Diagnosis Date  . Acute pulmonary embolism ([place]) 08/06/2013   Diagnosed during hospitalization for TKR.  Patient received heparin followed by Xarelto whic hwill continue for a total of 6 months.   [facility] has recommended IVC placement prior to next TKR and bridging with Lovenox (June 17  [place])    . Arthritis    osteoarthritis of knee  . Back pain   . DVT (deep venous thrombosis) ([place]) 12/28/2016  . Dysrhythmia 12/28/2016   atrial fibrillation, SVT  . Headache    Migraines  . History of Salmonella gastroenteritis 2012  . Hypertension   . IBS (irritable bowel syndrome)   . Internal hemorrhoids   . Ischemic colitis ([place]) 02/21/2014  . Normal cardiac stress test    2006, nromal stress cardiolite, EF 65%  . Pulmonary embolism ([place])    post knee surgery    Past Surgical History:  Procedure Laterality Date  . COLONOSCOPY  03/07/2011   Int Hemms  . COLONOSCOPY WITH PROPOFOL N/A 02/20/2017   Procedure: COLONOSCOPY WITH PROPOFOL;  Surgeon: Manya Silvas, MD;  Location: [facility] ENDOSCOPY;  Service: Endoscopy;  Laterality: N/A;  . HYSTEROSCOPY    . JOINT REPLACEMENT     Bilateral TKR  . ROTATOR CUFF REPAIR    . TUBAL LIGATION    . UPPER GASTROINTESTINAL ENDOSCOPY  03/07/2011   [facility] Multip Gastric Polyps/ Dr Vira Agar    Family History  Problem Relation Age of Onset  . Hypertension Mother   . Diabetes Mother   .  Cancer Mother        glioma  . Hypertension Father   . Heart disease Father 42       CAD with CABG  . Cancer Father        multiple myeloma  . Arthritis Sister   . Hypertension Sister   . Cancer Brother        Pre-cancerous lip lesion, testicular CA   . 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  dicyclomine (BENTYL) 10 MG capsule, Take 10 mg by mouth daily. , Disp: , Rfl:  .  diltiazem (CARDIZEM) 30 MG tablet, Take  30 mg by mouth daily as needed (SVT). , Disp: , Rfl:  .  diltiazem (CARTIA XT) 240 MG 24 hr capsule, Take 240 mg by mouth daily., Disp: , Rfl:  .  diphenhydrAMINE (BENADRYL) 25 MG tablet, As needed for allergies. , Disp: , Rfl:  .  estradiol (ESTRACE) 0.1 MG/GM vaginal cream, PLACE 1/4 APPLICATORFUL  VAGINALLY DAILY., Disp: 42.5 g, Rfl: 11 .  fluconazole (DIFLUCAN) 150 MG tablet, TAKE 1 TABLET (150 MG TOTAL) BY MOUTH DAILY FOR 2 DAYS, Disp: 2 tablet, Rfl: 3 .  hyoscyamine (LEVSIN SL) 0.125 MG SL tablet, Place 0.125 mg under the tongue every 4 (four) hours as needed for cramping., Disp: , Rfl:  .  levothyroxine (SYNTHROID, LEVOTHROID) 25 MCG tablet, Take 1 tablet (25 mcg total) by mouth daily before breakfast., Disp: 90 tablet, Rfl: 1 .  metoprolol succinate (TOPROL-XL) 50 MG 24 hr tablet, Take 50 mg by mouth daily. Take with or immediately following a meal. , Disp: , Rfl:  .  omeprazole (PRILOSEC OTC) 20 MG tablet, Take 20 mg by mouth daily.  , Disp: , Rfl:  .  cyclobenzaprine (FLEXERIL) 10 MG tablet, TAKE 1/2 TO 1 TABLET BY MOUTH 3 TIMES A DAY AS NEEDED FOR BACK PAIN, Disp: 180 tablet, Rfl: 2 .  diphenoxylate-atropine (LOMOTIL) 2.5-0.025 MG tablet, TAKE ONE TABLET BY MOUTH AS NEEDED, Disp: 30 tablet, Rfl: 2 .  PHENOHYTRO 16.2 MG tablet, TAKE 1 TABLET BY MOUTH EVERY 8 HOURS AS NEEDED., Disp: 120 tablet, Rfl: 0  EXAM:  VITALS per patient if applicable:  GENERAL: alert, oriented, appears well and in no acute distress  HEENT: atraumatic, conjunttiva clear, no obvious abnormalities on inspection of external nose and ears  NECK: normal movements of the head and neck  LUNGS: on inspection no signs of respiratory distress, breathing rate appears normal, no obvious gross SOB, gasping or wheezing  CV: no obvious cyanosis  MS: moves all visible extremities without noticeable abnormality  PSYCH/NEURO: pleasant and cooperative, no obvious depression or anxiety, speech and thought processing grossly  intact  ASSESSMENT AND PLAN:  Discussed the following assessment and plan:  Acquired hypothyroidism She is not tolerating minimal dose of levothyroxine due to increased heart rate. Repeat TSH is due.    Lab Results  Component Value Date   TSH 5.14 (H) 06/22/2018     New persistent daily headache recurring daily. advised to check blood sugar and blood pressure during next episode.  Vasculitis also a possibility but less likely given occipital location .  May need MRI brain    Updated Medication List Outpatient Encounter Medications as of 08/02/2018  Medication Sig  . dicyclomine (BENTYL) 10 MG capsule Take 10 mg by mouth daily.   [name]  diltiazem (CARDIZEM) 30 MG tablet Take 30 mg by mouth daily as needed (SVT).   [name] diltiazem (CARTIA XT) 240 MG 24 hr capsule Take 240 mg by mouth daily.  . diphenhydrAMINE (BENADRYL) 25 MG tablet As needed for allergies.   [name] estradiol (ESTRACE) 0.1 MG/GM vaginal cream PLACE 1/4 APPLICATORFUL  VAGINALLY DAILY.  . fluconazole (DIFLUCAN) 150 MG tablet TAKE 1 TABLET (150 MG TOTAL) BY MOUTH DAILY FOR 2 DAYS  . hyoscyamine (LEVSIN SL) 0.125 MG SL tablet Place 0.125 mg under the tongue every 4 (four) hours as needed for cramping.  [name] levothyroxine (SYNTHROID, LEVOTHROID) 25 MCG tablet Take 1 tablet (25 mcg total) by mouth daily before breakfast.  . metoprolol succinate (TOPROL-XL) 50 MG 24 hr tablet Take 50 mg by mouth daily. Take with or immediately following a meal.   . omeprazole (PRILOSEC OTC) 20 MG tablet Take 20 mg by mouth daily.    . [DISCONTINUED] cyclobenzaprine (FLEXERIL) 10 MG tablet Take 10 mg by mouth daily.  . [DISCONTINUED] Vitamin D, Ergocalciferol, (DRISDOL) 50000 units CAPS capsule TAKE 1 CAPSULE (50,000 UNITS TOTAL) BY MOUTH ONCE A WEEK.   No facility-administered encounter medications on file as of 08/02/2018.       I discussed the assessment and treatment plan with the patient. The patient was provided an opportunity to ask questions and  all were answered. The patient agreed with the plan and demonstrated an understanding of the instructions.   The patient was advised to call back or seek an in-person evaluation if the symptoms worsen or if the condition fails to improve as anticipated.  I provided 25 minutes of non-face-to-face time during this encounter.   Crecencio Mc, MD

## 2018-08-03 MED FILL — CYCLOBENZAPRINE HCL 10 MG T: 10 | 60 days supply | Qty: 180 | Fill #0

## 2018-08-03 MED FILL — DIPHENOXYLATE-ATROPINE 2.5-: 2.5-0.025 | 30 days supply | Qty: 30 | Fill #0

## 2018-08-03 NOTE — Telephone Encounter (Signed)
Neither of these medications are in the pt's current medication list.   Last OV: 08/02/2018 Next OV: 12/28/2018

## 2018-08-04 DIAGNOSIS — G4452 New daily persistent headache (NDPH): Secondary | ICD-10-CM | POA: Insufficient documentation

## 2018-08-04 NOTE — Assessment & Plan Note (Signed)
She is not tolerating minimal dose of levothyroxine due to increased heart rate. Repeat TSH is due.    Lab Results  Component Value Date   TSH 5.14 (H) 06/22/2018

## 2018-08-04 NOTE — Assessment & Plan Note (Addendum)
recurring daily. advised to check blood sugar and blood pressure during next episode.  Vasculitis also a possibility but less likely given occipital location .  May need MRI brain

## 2018-08-07 ENCOUNTER — Telehealth: Payer: Self-pay | Admitting: Internal Medicine

## 2018-08-07 MED FILL — HYDROCORTISONE-IODOQUINOL 1: 1-1 | 10 days supply | Qty: 28 | Fill #1

## 2018-08-07 NOTE — Telephone Encounter (Signed)
Rescheduled pt's lab appt for the requested date in the mychart message she sent. Sent a mychart message back letting the pt know of the appt date and time.

## 2018-08-07 NOTE — Telephone Encounter (Signed)
Tiffany Wells needs to reschedule her upcoming labs to  No earlier than  April 24th .  She has cancelled the lab appt for today via mychart

## 2018-08-09 ENCOUNTER — Other Ambulatory Visit: Payer: 59

## 2018-08-14 ENCOUNTER — Other Ambulatory Visit: Payer: Self-pay

## 2018-08-14 ENCOUNTER — Other Ambulatory Visit (INDEPENDENT_AMBULATORY_CARE_PROVIDER_SITE_OTHER): Payer: 59

## 2018-08-14 DIAGNOSIS — E1169 Type 2 diabetes mellitus with other specified complication: Secondary | ICD-10-CM

## 2018-08-14 DIAGNOSIS — R7989 Other specified abnormal findings of blood chemistry: Secondary | ICD-10-CM

## 2018-08-14 DIAGNOSIS — I471 Supraventricular tachycardia: Secondary | ICD-10-CM | POA: Diagnosis not present

## 2018-08-14 DIAGNOSIS — E785 Hyperlipidemia, unspecified: Secondary | ICD-10-CM | POA: Diagnosis not present

## 2018-08-14 DIAGNOSIS — G4452 New daily persistent headache (NDPH): Secondary | ICD-10-CM

## 2018-08-14 DIAGNOSIS — E039 Hypothyroidism, unspecified: Secondary | ICD-10-CM

## 2018-08-14 LAB — BASIC METABOLIC PANEL
BUN: 13 mg/dL (ref 6–23)
CO2: 27 mEq/L (ref 19–32)
Calcium: 8.5 mg/dL (ref 8.4–10.5)
Chloride: 103 mEq/L (ref 96–112)
Creatinine, Ser: 0.89 mg/dL (ref 0.40–1.20)
GFR: 63.69 mL/min (ref >60.00)
Glucose, Bld: 155 mg/dL — ABNORMAL HIGH (ref 70–99)
Potassium: 4 mEq/L (ref 3.5–5.1)
Sodium: 139 mEq/L (ref 135–145)

## 2018-08-14 LAB — CBC WITH DIFFERENTIAL/PLATELET
Basophils Absolute: 0.1 10*3/uL (ref 0.0–0.1)
Basophils Relative: 0.9 % (ref 0.0–3.0)
Eosinophils Absolute: 0.1 10*3/uL (ref 0.0–0.7)
Eosinophils Relative: 1.9 % (ref 0.0–5.0)
HCT: 40.6 % (ref 36.0–46.0)
Hemoglobin: 13.5 g/dL (ref 12.0–15.0)
Lymphocytes Relative: 26.9 % (ref 12.0–46.0)
Lymphs Abs: 1.8 10*3/uL (ref 0.7–4.0)
MCHC: 33.3 g/dL (ref 30.0–36.0)
MCV: 80.3 fl (ref 78.0–100.0)
Monocytes Absolute: 0.5 10*3/uL (ref 0.1–1.0)
Monocytes Relative: 7.2 % (ref 3.0–12.0)
Neutro Abs: 4.2 10*3/uL (ref 1.4–7.7)
Neutrophils Relative %: 63.1 % (ref 43.0–77.0)
Platelets: 186 10*3/uL (ref 150.0–400.0)
RBC: 5.06 Mil/uL (ref 3.87–5.11)
RDW: 14.4 % (ref 11.5–15.5)
WBC: 6.7 10*3/uL (ref 4.0–10.5)

## 2018-08-14 LAB — MAGNESIUM: Magnesium: 1.8 mg/dL (ref 1.5–2.5)

## 2018-08-14 LAB — TSH: TSH: 3.06 u[IU]/mL (ref 0.35–4.50)

## 2018-08-14 LAB — T4, FREE: Free T4: 0.87 ng/dL (ref 0.60–1.60)

## 2018-08-14 LAB — LIPID PANEL
Cholesterol: 213 mg/dL — ABNORMAL HIGH (ref 0–200)
HDL: 41.9 mg/dL (ref >39.00)
LDL Cholesterol: 145 mg/dL — ABNORMAL HIGH (ref 0–99)
NonHDL: 170.68
Total CHOL/HDL Ratio: 5
Triglycerides: 126 mg/dL (ref 0.0–149.0)
VLDL: 25.2 mg/dL (ref 0.0–40.0)

## 2018-08-14 LAB — SEDIMENTATION RATE: Sed Rate: 8 mm/hr (ref 0–30)

## 2018-08-16 MED FILL — HYDROCORTISONE-IODOQUINOL 1: 1-1 | 10 days supply | Qty: 28 | Fill #2

## 2018-08-27 MED FILL — DICYCLOMINE 10 MG CAPSULE: 10 | 30 days supply | Qty: 60 | Fill #1

## 2018-08-28 ENCOUNTER — Ambulatory Visit: Payer: 59

## 2018-08-28 MED FILL — CARTIA XT 240 MG CAPSULE SA: 240 | 90 days supply | Qty: 90 | Fill #0

## 2018-08-28 MED FILL — LEVOTHYROXINE 25 MCG TABLET: 25 | 90 days supply | Qty: 90 | Fill #0

## 2018-09-04 MED FILL — ESTRADIOL 0.1 MG/GM CREA: 0.1 | 30 days supply | Qty: 43 | Fill #1

## 2018-09-04 MED FILL — DIPHENOXYLATE-ATROPINE 2.5-: 2.5-0.025 | 30 days supply | Qty: 30 | Fill #1

## 2018-09-04 MED FILL — FLUCONAZOLE 150 MG TABS: 150 | 2 days supply | Qty: 2 | Fill #1

## 2018-09-05 ENCOUNTER — Other Ambulatory Visit: Payer: Self-pay

## 2018-09-05 ENCOUNTER — Ambulatory Visit (INDEPENDENT_AMBULATORY_CARE_PROVIDER_SITE_OTHER): Payer: 59 | Admitting: *Deleted

## 2018-09-05 DIAGNOSIS — Z23 Encounter for immunization: Secondary | ICD-10-CM | POA: Diagnosis not present

## 2018-09-17 ENCOUNTER — Ambulatory Visit (INDEPENDENT_AMBULATORY_CARE_PROVIDER_SITE_OTHER): Payer: 59

## 2018-09-17 ENCOUNTER — Other Ambulatory Visit (INDEPENDENT_AMBULATORY_CARE_PROVIDER_SITE_OTHER): Payer: Self-pay | Admitting: Vascular Surgery

## 2018-09-17 ENCOUNTER — Other Ambulatory Visit: Payer: Self-pay

## 2018-09-17 DIAGNOSIS — R600 Localized edema: Secondary | ICD-10-CM | POA: Diagnosis not present

## 2018-09-17 DIAGNOSIS — M7989 Other specified soft tissue disorders: Secondary | ICD-10-CM

## 2018-09-17 DIAGNOSIS — Z86718 Personal history of other venous thrombosis and embolism: Secondary | ICD-10-CM

## 2018-09-25 MED FILL — DICYCLOMINE 10 MG CAPSULE: 10 | 30 days supply | Qty: 60 | Fill #0

## 2018-10-04 MED FILL — ESTRADIOL 0.1 MG/GM CREA: 0.1 | 30 days supply | Qty: 43 | Fill #2

## 2018-10-04 MED FILL — FLUCONAZOLE 150 MG TABS: 150 | 2 days supply | Qty: 2 | Fill #2

## 2018-10-09 MED FILL — DICYCLOMINE 10 MG CAPSULE: 10 | 30 days supply | Qty: 60 | Fill #0

## 2018-10-17 DIAGNOSIS — R079 Chest pain, unspecified: Secondary | ICD-10-CM | POA: Diagnosis not present

## 2018-10-17 DIAGNOSIS — I208 Other forms of angina pectoris: Secondary | ICD-10-CM | POA: Diagnosis not present

## 2018-10-17 DIAGNOSIS — I471 Supraventricular tachycardia: Secondary | ICD-10-CM | POA: Diagnosis not present

## 2018-10-22 ENCOUNTER — Other Ambulatory Visit: Payer: Self-pay | Admitting: *Deleted

## 2018-10-22 DIAGNOSIS — R072 Precordial pain: Secondary | ICD-10-CM

## 2018-10-23 DIAGNOSIS — H524 Presbyopia: Secondary | ICD-10-CM | POA: Diagnosis not present

## 2018-10-27 ENCOUNTER — Telehealth: Payer: Self-pay | Admitting: Internal Medicine

## 2018-10-27 ENCOUNTER — Telehealth: Payer: Self-pay

## 2018-10-27 DIAGNOSIS — Z20822 Contact with and (suspected) exposure to covid-19: Secondary | ICD-10-CM

## 2018-10-27 NOTE — Telephone Encounter (Signed)
Patient notified Dr Derrel Nip of sudden onset of body aches,  Malaise ,  Chills and home temps to 100.54F  That woke her up this am at 5 am.   She has been instructed to self quarantine until she can be tested for COVID 19 and will excuse herself from a planned family vacation to the beach until her tecan ast has resulted.  She prefers to wait for the testing to be done at the [facility], which may be Monday, but is available (obviously) over the weekend to be tested if the [facility] is running the testing center prior to Monday.  COVIS 19 TESTING NEEDED and referral made.

## 2018-10-27 NOTE — Telephone Encounter (Signed)
Pt schedule for 10/29/18 at 0815 at [facility] Pt advised to stay in car and wear mask to testing site. Address provided. Pt verbalized understanding.

## 2018-10-29 ENCOUNTER — Other Ambulatory Visit: Payer: 59

## 2018-10-29 ENCOUNTER — Other Ambulatory Visit: Payer: Self-pay | Admitting: Internal Medicine

## 2018-10-29 DIAGNOSIS — R6889 Other general symptoms and signs: Secondary | ICD-10-CM | POA: Diagnosis not present

## 2018-10-29 DIAGNOSIS — Z20822 Contact with and (suspected) exposure to covid-19: Secondary | ICD-10-CM

## 2018-11-01 LAB — NOVEL CORONAVIRUS, NAA: SARS-CoV-2, NAA: NOT DETECTED

## 2018-11-08 ENCOUNTER — Ambulatory Visit: Payer: 59

## 2018-11-12 ENCOUNTER — Other Ambulatory Visit: Payer: 59

## 2018-11-13 ENCOUNTER — Ambulatory Visit (HOSPITAL_COMMUNITY): Payer: 59

## 2018-11-20 ENCOUNTER — Other Ambulatory Visit: Payer: Self-pay

## 2018-11-20 ENCOUNTER — Ambulatory Visit (INDEPENDENT_AMBULATORY_CARE_PROVIDER_SITE_OTHER): Payer: Medicare Other

## 2018-11-20 DIAGNOSIS — Z23 Encounter for immunization: Secondary | ICD-10-CM

## 2018-11-23 MED FILL — METOPROLOL SUCCINATE ER 50: 50 | 90 days supply | Qty: 180 | Fill #1

## 2018-11-23 MED FILL — CARTIA XT 240 MG CAPSULE SA: 240 | 90 days supply | Qty: 90 | Fill #1

## 2018-11-29 ENCOUNTER — Other Ambulatory Visit: Payer: Self-pay | Admitting: Internal Medicine

## 2018-11-30 NOTE — Telephone Encounter (Signed)
Patient said once every two and half months to 3 months she takes one prophylactically and she does not get so much fungal infection? Ok to fill?

## 2018-12-01 MED FILL — FLUCONAZOLE 150 MG TABS: 150 | 2 days supply | Qty: 2 | Fill #0

## 2018-12-07 MED FILL — FLUCONAZOLE 150 MG TABS: 150 | 2 days supply | Qty: 2 | Fill #1

## 2018-12-12 MED FILL — CYCLOBENZAPRINE HCL 10 MG T: 10 | 60 days supply | Qty: 180 | Fill #1

## 2018-12-28 ENCOUNTER — Ambulatory Visit: Payer: 59 | Admitting: Internal Medicine

## 2019-01-25 ENCOUNTER — Ambulatory Visit: Payer: Medicare Other

## 2019-03-18 ENCOUNTER — Other Ambulatory Visit: Payer: Self-pay | Admitting: Internal Medicine

## 2019-04-08 ENCOUNTER — Other Ambulatory Visit: Payer: Self-pay | Admitting: Internal Medicine

## 2019-04-08 MED ORDER — DICYCLOMINE HCL 10 MG PO CAPS: 10.0000 mg | ORAL_CAPSULE | Freq: Every day | ORAL | 3 refills | Status: DC

## 2019-04-08 MED ORDER — HYOSCYAMINE SULFATE 0.125 MG SL SUBL: 0.1250 mg | SUBLINGUAL_TABLET | SUBLINGUAL | 5 refills | Status: DC | PRN

## 2019-05-13 ENCOUNTER — Other Ambulatory Visit: Payer: Self-pay

## 2019-05-15 ENCOUNTER — Ambulatory Visit: Payer: Medicare Other | Admitting: Internal Medicine

## 2019-05-24 ENCOUNTER — Encounter: Payer: Self-pay | Admitting: Internal Medicine

## 2019-05-24 ENCOUNTER — Other Ambulatory Visit: Payer: Self-pay | Admitting: Internal Medicine

## 2019-05-24 ENCOUNTER — Ambulatory Visit (INDEPENDENT_AMBULATORY_CARE_PROVIDER_SITE_OTHER): Payer: Medicare Other | Admitting: Internal Medicine

## 2019-05-24 ENCOUNTER — Other Ambulatory Visit: Payer: Self-pay

## 2019-05-24 VITALS — BP 126/74 | HR 87 | Temp 95.9°F | Resp 14 | Ht 68.0 in | Wt 309.8 lb

## 2019-05-24 DIAGNOSIS — I10 Essential (primary) hypertension: Secondary | ICD-10-CM | POA: Diagnosis not present

## 2019-05-24 DIAGNOSIS — E039 Hypothyroidism, unspecified: Secondary | ICD-10-CM | POA: Diagnosis not present

## 2019-05-24 DIAGNOSIS — Z780 Asymptomatic menopausal state: Secondary | ICD-10-CM

## 2019-05-24 DIAGNOSIS — E119 Type 2 diabetes mellitus without complications: Secondary | ICD-10-CM

## 2019-05-24 DIAGNOSIS — E739 Lactose intolerance, unspecified: Secondary | ICD-10-CM | POA: Diagnosis not present

## 2019-05-24 DIAGNOSIS — Z1231 Encounter for screening mammogram for malignant neoplasm of breast: Secondary | ICD-10-CM

## 2019-05-24 DIAGNOSIS — I471 Supraventricular tachycardia: Secondary | ICD-10-CM

## 2019-05-24 DIAGNOSIS — Z0000 Encounter for general adult medical examination without abnormal findings: Secondary | ICD-10-CM | POA: Diagnosis not present

## 2019-05-24 DIAGNOSIS — M8580 Other specified disorders of bone density and structure, unspecified site: Secondary | ICD-10-CM

## 2019-05-24 MED ORDER — DICYCLOMINE HCL 10 MG PO CAPS: 10.0000 mg | ORAL_CAPSULE | Freq: Three times a day (TID) | ORAL | 3 refills | Status: DC

## 2019-05-24 NOTE — Progress Notes (Signed)
Patient ID: Tiffany Wells, female    DOB: 18-Jun-1955  Age: 64 y.o. MRN: [id number]  The patient is here for Welcome to Medicare examination and management of other chronic and acute problems.   The risk factors are reflected in the social history.  The roster of all physicians providing medical care to patient - is listed in the Snapshot section of the chart.  Activities of daily living:  The patient is 100% independent in all ADLs: dressing, toileting, feeding as well as independent mobility  Home safety : The patient has smoke detectors in the home. They wear seatbelts.  There are no firearms at home. There is no violence in the home.   There is no risks for hepatitis, STDs or HIV. There is no   history of blood transfusion. They have no travel history to infectious disease endemic areas of the world.  The patient has seen their dentist in the last six month. They have seen their eye doctor in the last year. They admit to slight hearing difficulty with regard to whispered voices and some television programs.  They have deferred audiologic testing in the last year.  They do not  have excessive sun exposure. Discussed the need for sun protection: hats, long sleeves and use of sunscreen if there is significant sun exposure.   Diet: the importance of a healthy diet is discussed. They do have a healthy diet.  The benefits of regular aerobic exercise were discussed. She walks quite a bit during her daily work    Depression screen: there are no signs or vegative symptoms of depression- irritability, change in appetite, anhedonia, sadness/tearfullness.  Cognitive assessment: the patient manages all their financial and personal affairs and is actively engaged. They could relate day,date,year and events; recalled 3/3 objects at 3 minutes; performed clock-face test normally.  The following portions of the patient's history were reviewed and updated as appropriate: allergies, current medications, past  family history, past medical history,  past surgical history, past social history  and problem list.  Visual acuity was not assessed per patient preference since she has regular follow up with her ophthalmologist. Hearing and body mass index were assessed and reviewed.   During the course of the visit the patient was educated and counseled about appropriate screening and preventive services including : fall prevention , diabetes screening, nutrition counseling, colorectal cancer screening, and recommended immunizations.    CC: The primary encounter diagnosis was Acquired hypothyroidism. Diagnoses of Essential hypertension, Type 2 diabetes mellitus without complication, without long-term current use of insulin ([place]), Lactose intolerance, Breast cancer screening by mammogram, Osteopenia after menopause, Postmenopausal estrogen deficiency, SVT (supraventricular tachycardia) ([place]), Morbid obesity ([place]), and Welcome to Medicare preventive visit were also pertinent to this visit.  IBS:  improved GI symptoms with lactose avoidance  SVT episode occurred recently with irregularity ,  lasted several hours , occurred 3 weeks ago  Occurring once a month . Gets an accompanying feeling of tiredness and neck tightness   Seeing [facility] for gyn and mammogram pap smear due .history of ASCUS.    History Aanika has a past medical history of Acute pulmonary embolism ([place]) (08/06/2013), Arthritis, Back pain, DVT (deep venous thrombosis) ([place]) (12/28/2016), Dysrhythmia (12/28/2016), Headache, History of Salmonella gastroenteritis (2012), Hypertension, IBS (irritable bowel syndrome), Internal hemorrhoids, Ischemic colitis ([place]) (02/21/2014), Normal cardiac stress test, and Pulmonary embolism ([place]).   She has a past surgical history that includes Rotator cuff repair; Tubal ligation; Colonoscopy (03/07/2011); Hysteroscopy; Upper gastrointestinal endoscopy (03/07/2011);  Joint replacement; and Colonoscopy with propofol (N/A,  02/20/2017).   Her family history includes Arthritis in her sister; Cancer in her brother, father, maternal grandmother, and mother; Diabetes in her mother; Heart disease (age of onset: 18) in her father; Hypertension in her father, mother, and sister.She reports that she has never smoked. She has never used smokeless tobacco. She reports current alcohol use of about 2.0 standard drinks of alcohol per week. She reports that she does not use drugs.  Outpatient Medications Prior to Visit  Medication Sig Dispense Refill  . cyclobenzaprine (FLEXERIL) 10 MG tablet TAKE 1/2 TO 1 TABLET BY MOUTH 3 TIMES A DAY AS NEEDED FOR BACK PAIN 180 tablet 2  . diltiazem (CARDIZEM) 30 MG tablet Take 30 mg by mouth daily as needed (SVT).     [name] diltiazem (CARTIA XT) 240 MG 24 hr capsule Take 240 mg by mouth daily.    . diphenhydrAMINE (BENADRYL) 25 MG tablet As needed for allergies.     . diphenoxylate-atropine (LOMOTIL) 2.5-0.025 MG tablet TAKE ONE TABLET BY MOUTH AS NEEDED 30 tablet 2  . estradiol (ESTRACE) 0.1 MG/GM vaginal cream PLACE 1/4 APPLICATORFUL  VAGINALLY DAILY. 42.5 g 11  . fluconazole (DIFLUCAN) 150 MG tablet TAKE 1 TABLET (150 MG TOTAL) BY MOUTH DAILY FOR 2 DAYS 2 tablet 2  . hyoscyamine (LEVSIN SL) 0.125 MG SL tablet Place 1 tablet (0.125 mg total) under the tongue every 4 (four) hours as needed for cramping. 60 tablet 5  . levothyroxine (SYNTHROID) 25 MCG tablet TAKE 1 TABLET BY MOUTH DAILY BEFORE BREAKFAST 90 tablet 0  . metoprolol succinate (TOPROL-XL) 50 MG 24 hr tablet Take 50 mg by mouth daily. Take with or immediately following a meal.     . omeprazole (PRILOSEC OTC) 20 MG tablet Take 20 mg by mouth daily.      [name] PHENOHYTRO 16.2 MG tablet TAKE 1 TABLET BY MOUTH EVERY 8 HOURS AS NEEDED. 120 tablet 0  . dicyclomine (BENTYL) 10 MG capsule Take 1 capsule (10 mg total) by mouth daily. (Patient taking differently: Take 20 mg by mouth daily. ) 120 capsule 3   No facility-administered medications prior  to visit.    Review of Systems   Patient denies headache, fevers, malaise, unintentional weight loss, skin rash, eye pain, sinus congestion and sinus pain, sore throat, dysphagia,  hemoptysis , cough, dyspnea, wheezing, chest pain, palpitations, orthopnea, edema, abdominal pain, nausea, melena, diarrhea, constipation, flank pain, dysuria, hematuria, urinary  Frequency, nocturia, numbness, tingling, seizures,  Focal weakness, Loss of consciousness,  Tremor, insomnia, depression, anxiety, and suicidal ideation.      Objective:  BP 126/74 (BP Location: Left Arm, Patient Position: Sitting, Cuff Size: Large)   Pulse 87   Temp (!) 95.9 F (35.5 C) (Temporal)   Resp 14   Ht 5\' 8"  (1.727 m)   Wt (!) 309 lb 12.8 oz (140.5 kg)   SpO2 99%   BMI 47.10 kg/m   Physical Exam  General appearance: alert, cooperative and appears stated age Ears: normal TM's and external ear canals both ears Throat: lips, mucosa, and tongue normal; teeth and gums normal Neck: no adenopathy, no carotid bruit, supple, symmetrical, trachea midline and thyroid not enlarged, symmetric, no tenderness/mass/nodules Back: symmetric, no curvature. ROM normal. No CVA tenderness. Lungs: clear to auscultation bilaterally Heart: regular rate and rhythm, S1, S2 normal, no murmur, click, rub or gallop Abdomen: soft, non-tender; bowel sounds normal; no masses,  no organomegaly Pulses: 2+ and symmetric Skin: Skin  color, texture, turgor normal. No rashes or lesions Lymph nodes: Cervical, supraclavicular, and axillary nodes normal.   Assessment & Plan:   Problem List Items Addressed This Visit      Unprioritized   Welcome to Medicare preventive visit    Annual comprehensive preventive exam was done as well as an evaluation and management of chronic conditions .  During the course of the visit the patient was educated and counseled about appropriate screening and preventive services including : nutrition counseling, colorectal  cancer screening, and recommended immunizations.  Printed recommendations for health maintenance screenings was given.       Type 2 diabetes mellitus without complications ([place])    Diagnosed last year with a1c of 7.0    Remains well-controlled on diet alone .  hemoglobin A1c is now  less than 7.0 . Patient is up-to-date on eye exams and foot exam is normal today. Patient has no microalbuminuria. Patient has not been prescribed statin therapy for CAD risk reduction but will return for fasting labs   Lab Results  Component Value Date   HGBA1C 6.0 10/16/2013   Lab Results  Component Value Date   MICROALBUR 0.2 10/16/2013         Relevant Orders   Hemoglobin A1c (Completed)   Microalbumin / creatinine urine ratio (Completed)   Direct LDL (Completed)   SVT (supraventricular tachycardia) ([facility])    Recurrent episodes despite use of Toprol XL 50 mg daily  And cardizem 240 mg daily.  Appt with Ubaldo Glassing is in the very near future.   Lab Results  Component Value Date   TSH 2.63 05/24/2019   Lab Results  Component Value Date   NA 139 05/24/2019   K 4.1 05/24/2019   CL 102 05/24/2019   CO2 23 05/24/2019   Mg is 2.0 on recheck.       Morbid obesity ([place])    I have addressed  BMI and recommended wt loss of 10% of body weigh over the next 6 months using a low glycemic index diet and regular exercise a minimum of 5 days per week.        Lactose intolerance   Essential hypertension    Well controlled on current regimen. Renal function stable, no changes today.  Lab Results  Component Value Date   CREATININE 0.83 05/24/2019   Lab Results  Component Value Date   NA 139 05/24/2019   K 4.1 05/24/2019   CL 102 05/24/2019   CO2 23 05/24/2019         Relevant Orders   Comprehensive metabolic panel (Completed)   Acquired hypothyroidism - Primary   Relevant Orders   TSH (Completed)    Other Visit Diagnoses    Breast cancer screening by mammogram       Relevant Orders   MM DIGITAL  SCREENING BILATERAL   Osteopenia after menopause       Postmenopausal estrogen deficiency       Relevant Orders   DG Bone Density      I have changed Neiva C. Hoffmeister's dicyclomine. I am also having her maintain her diphenhydrAMINE, omeprazole, diltiazem, diltiazem, metoprolol succinate, estradiol, cyclobenzaprine, diphenoxylate-atropine, Phenohytro, fluconazole, levothyroxine, and hyoscyamine.  Meds ordered this encounter  Medications  . dicyclomine (BENTYL) 10 MG capsule    Sig: Take 1 capsule (10 mg total) by mouth 3 (three) times daily before meals.    Dispense:  120 capsule    Refill:  3    Medications Discontinued During This Encounter  Medication Reason  . dicyclomine (BENTYL) 10 MG capsule     Follow-up: No follow-ups on file.   Crecencio Mc, MD

## 2019-05-24 NOTE — Patient Instructions (Signed)

## 2019-05-25 ENCOUNTER — Other Ambulatory Visit: Payer: Self-pay | Admitting: Internal Medicine

## 2019-05-25 DIAGNOSIS — E1169 Type 2 diabetes mellitus with other specified complication: Secondary | ICD-10-CM

## 2019-05-25 DIAGNOSIS — E119 Type 2 diabetes mellitus without complications: Secondary | ICD-10-CM

## 2019-05-25 DIAGNOSIS — E785 Hyperlipidemia, unspecified: Secondary | ICD-10-CM

## 2019-05-25 LAB — COMPREHENSIVE METABOLIC PANEL
AG Ratio: 1.6 (calc) (ref 1.0–2.5)
ALT: 15 U/L (ref 6–29)
AST: 17 U/L (ref 10–35)
Albumin: 3.8 g/dL (ref 3.6–5.1)
Alkaline phosphatase (APISO): 80 U/L (ref 37–153)
BUN: 14 mg/dL (ref 7–25)
CO2: 23 mmol/L (ref 20–32)
Calcium: 9 mg/dL (ref 8.6–10.4)
Chloride: 102 mmol/L (ref 98–110)
Creat: 0.83 mg/dL (ref 0.50–0.99)
Globulin: 2.4 g/dL (calc) (ref 1.9–3.7)
Glucose, Bld: 116 mg/dL — ABNORMAL HIGH (ref 65–99)
Potassium: 4.1 mmol/L (ref 3.5–5.3)
Sodium: 139 mmol/L (ref 135–146)
Total Bilirubin: 0.3 mg/dL (ref 0.2–1.2)
Total Protein: 6.2 g/dL (ref 6.1–8.1)

## 2019-05-25 LAB — MICROALBUMIN / CREATININE URINE RATIO
Creatinine, Urine: 143 mg/dL (ref 20–275)
Microalb Creat Ratio: 1 mcg/mg creat (ref <30)
Microalb, Ur: 0.2 mg/dL

## 2019-05-25 LAB — LDL CHOLESTEROL, DIRECT: Direct LDL: 146 mg/dL — ABNORMAL HIGH (ref <100)

## 2019-05-25 LAB — TSH: TSH: 2.63 mIU/L (ref 0.40–4.50)

## 2019-05-25 LAB — HEMOGLOBIN A1C
Hgb A1c MFr Bld: 6.5 % of total Hgb — ABNORMAL HIGH (ref <5.7)
Mean Plasma Glucose: 140 (calc)
eAG (mmol/L): 7.7 (calc)

## 2019-05-25 NOTE — Assessment & Plan Note (Signed)
Diagnosed last year with a1c of 7.0    Remains well-controlled on diet alone .  hemoglobin A1c is now  less than 7.0 . Patient is up-to-date on eye exams and foot exam is normal today. Patient has no microalbuminuria. Patient has not been prescribed statin therapy for CAD risk reduction but will return for fasting labs   Lab Results  Component Value Date   HGBA1C 6.0 10/16/2013   Lab Results  Component Value Date   MICROALBUR 0.2 10/16/2013

## 2019-05-25 NOTE — Assessment & Plan Note (Signed)
Annual comprehensive preventive exam was done as well as an evaluation and management of chronic conditions .  During the course of the visit the patient was educated and counseled about appropriate screening and preventive services including : nutrition counseling, colorectal cancer screening, and recommended immunizations.  Printed recommendations for health maintenance screenings was given.

## 2019-05-25 NOTE — Assessment & Plan Note (Signed)
I have addressed  BMI and recommended wt loss of 10% of body weigh over the next 6 months using a low glycemic index diet and regular exercise a minimum of 5 days per week.   

## 2019-05-25 NOTE — Assessment & Plan Note (Signed)
Well controlled on current regimen. Renal function stable, no changes today.  Lab Results  Component Value Date   CREATININE 0.83 05/24/2019   Lab Results  Component Value Date   NA 139 05/24/2019   K 4.1 05/24/2019   CL 102 05/24/2019   CO2 23 05/24/2019

## 2019-05-25 NOTE — Assessment & Plan Note (Addendum)
Recurrent episodes despite use of Toprol XL 50 mg daily  And cardizem 240 mg daily.  Appt with Ubaldo Glassing is in the very near future.   Lab Results  Component Value Date   TSH 2.63 05/24/2019   Lab Results  Component Value Date   NA 139 05/24/2019   K 4.1 05/24/2019   CL 102 05/24/2019   CO2 23 05/24/2019   Mg is 2.0 on recheck.

## 2019-05-29 ENCOUNTER — Other Ambulatory Visit: Payer: Self-pay | Admitting: Internal Medicine

## 2019-05-30 NOTE — Telephone Encounter (Signed)
Refilled: 09/08/2016 Last OV: 05/24/2019 Next OV: not scheduled

## 2019-06-21 LAB — HM DEXA SCAN

## 2019-06-21 LAB — HM MAMMOGRAPHY

## 2019-06-24 ENCOUNTER — Other Ambulatory Visit: Payer: Self-pay | Admitting: Internal Medicine

## 2019-08-07 ENCOUNTER — Telehealth: Payer: Self-pay

## 2019-08-07 NOTE — Telephone Encounter (Signed)
PA for dicyclomine has been submitted on covermymeds.

## 2019-08-22 ENCOUNTER — Ambulatory Visit
Admission: RE | Admit: 2019-08-22 | Discharge: 2019-08-22 | Disposition: A | Discharge status: Home / Self Care | Payer: Medicare Other | Source: Ambulatory Visit | Attending: Internal Medicine | Admitting: Internal Medicine

## 2019-08-22 ENCOUNTER — Encounter: Payer: Self-pay | Admitting: Internal Medicine

## 2019-08-22 ENCOUNTER — Other Ambulatory Visit: Payer: Self-pay

## 2019-08-22 ENCOUNTER — Telehealth: Payer: Self-pay

## 2019-08-22 ENCOUNTER — Telehealth: Payer: Self-pay | Admitting: Internal Medicine

## 2019-08-22 ENCOUNTER — Ambulatory Visit (INDEPENDENT_AMBULATORY_CARE_PROVIDER_SITE_OTHER): Payer: Medicare Other | Admitting: Internal Medicine

## 2019-08-22 ENCOUNTER — Other Ambulatory Visit
Admission: RE | Admit: 2019-08-22 | Discharge: 2019-08-22 | Disposition: A | Discharge status: Home / Self Care | Payer: Medicare Other | Source: Home / Self Care | Attending: Internal Medicine | Admitting: Internal Medicine

## 2019-08-22 ENCOUNTER — Other Ambulatory Visit: Payer: Self-pay | Admitting: Internal Medicine

## 2019-08-22 DIAGNOSIS — E1169 Type 2 diabetes mellitus with other specified complication: Secondary | ICD-10-CM | POA: Diagnosis not present

## 2019-08-22 DIAGNOSIS — K625 Hemorrhage of anus and rectum: Secondary | ICD-10-CM

## 2019-08-22 DIAGNOSIS — K529 Noninfective gastroenteritis and colitis, unspecified: Secondary | ICD-10-CM

## 2019-08-22 DIAGNOSIS — I998 Other disorder of circulatory system: Secondary | ICD-10-CM | POA: Diagnosis present

## 2019-08-22 DIAGNOSIS — E119 Type 2 diabetes mellitus without complications: Secondary | ICD-10-CM

## 2019-08-22 DIAGNOSIS — E785 Hyperlipidemia, unspecified: Secondary | ICD-10-CM

## 2019-08-22 DIAGNOSIS — K580 Irritable bowel syndrome with diarrhea: Secondary | ICD-10-CM | POA: Diagnosis not present

## 2019-08-22 DIAGNOSIS — K921 Melena: Secondary | ICD-10-CM | POA: Insufficient documentation

## 2019-08-22 LAB — LIPID PANEL
Cholesterol: 228 mg/dL — ABNORMAL HIGH (ref 0–200)
HDL: 45.7 mg/dL (ref >39.00)
LDL Cholesterol: 152 mg/dL — ABNORMAL HIGH (ref 0–99)
NonHDL: 181.98
Total CHOL/HDL Ratio: 5
Triglycerides: 152 mg/dL — ABNORMAL HIGH (ref 0.0–149.0)
VLDL: 30.4 mg/dL (ref 0.0–40.0)

## 2019-08-22 LAB — CBC WITH DIFFERENTIAL/PLATELET
Basophils Absolute: 0.1 10*3/uL (ref 0.0–0.1)
Basophils Relative: 0.7 % (ref 0.0–3.0)
Eosinophils Absolute: 0.1 10*3/uL (ref 0.0–0.7)
Eosinophils Relative: 0.7 % (ref 0.0–5.0)
HCT: 40.5 % (ref 36.0–46.0)
Hemoglobin: 13.5 g/dL (ref 12.0–15.0)
Lymphocytes Relative: 23.4 % (ref 12.0–46.0)
Lymphs Abs: 2 10*3/uL (ref 0.7–4.0)
MCHC: 33.3 g/dL (ref 30.0–36.0)
MCV: 82.3 fl (ref 78.0–100.0)
Monocytes Absolute: 0.6 10*3/uL (ref 0.1–1.0)
Monocytes Relative: 7.3 % (ref 3.0–12.0)
Neutro Abs: 5.8 10*3/uL (ref 1.4–7.7)
Neutrophils Relative %: 67.9 % (ref 43.0–77.0)
Platelets: 231 10*3/uL (ref 150.0–400.0)
RBC: 4.93 Mil/uL (ref 3.87–5.11)
RDW: 14.5 % (ref 11.5–15.5)
WBC: 8.5 10*3/uL (ref 4.0–10.5)

## 2019-08-22 LAB — COMPREHENSIVE METABOLIC PANEL
ALT: 18 U/L (ref 0–44)
AST: 16 U/L (ref 15–41)
Albumin: 3.8 g/dL (ref 3.5–5.0)
Alkaline Phosphatase: 70 U/L (ref 38–126)
Anion gap: 7 (ref 5–15)
BUN: 17 mg/dL (ref 8–23)
CO2: 28 mmol/L (ref 22–32)
Calcium: 8.6 mg/dL — ABNORMAL LOW (ref 8.9–10.3)
Chloride: 102 mmol/L (ref 98–111)
Creatinine, Ser: 0.84 mg/dL (ref 0.44–1.00)
GFR calc Af Amer: >60 mL/min (ref >60)
GFR calc non Af Amer: >60 mL/min (ref >60)
Glucose, Bld: 142 mg/dL — ABNORMAL HIGH (ref 70–99)
Potassium: 4.3 mmol/L (ref 3.5–5.1)
Sodium: 137 mmol/L (ref 135–145)
Total Bilirubin: 0.7 mg/dL (ref 0.3–1.2)
Total Protein: 6.8 g/dL (ref 6.5–8.1)

## 2019-08-22 LAB — SEDIMENTATION RATE: Sed Rate: 4 mm/hr (ref 0–30)

## 2019-08-22 LAB — HEMOGLOBIN A1C: Hgb A1c MFr Bld: 6.7 % — ABNORMAL HIGH (ref 4.6–6.5)

## 2019-08-22 MED ORDER — PROMETHAZINE HCL 12.5 MG PO TABS
12.5000 mg | ORAL_TABLET | Freq: Three times a day (TID) | ORAL | 0 refills | Status: DC | PRN
Start: 2019-08-22 — End: 2023-12-08

## 2019-08-22 MED ORDER — IOHEXOL 350 MG/ML SOLN
150.0000 mL | Freq: Once | INTRAVENOUS | Status: AC | PRN
  Administered 2019-08-22: 125 mL via INTRAVENOUS

## 2019-08-22 MED ORDER — ONDANSETRON 4 MG PO TBDP
4.0000 mg | ORAL_TABLET | Freq: Three times a day (TID) | ORAL | 0 refills | Status: DC | PRN
Start: 2019-08-22 — End: 2022-08-16

## 2019-08-22 NOTE — Assessment & Plan Note (Addendum)
Likely due to internal hemorrhoid irritation from recurrent diarrhea. Cbc pending

## 2019-08-22 NOTE — Telephone Encounter (Signed)
[facility] called about stat ct results. Impression is below.  IMPRESSION: VASCULAR  1. Main visceral arteries are patent without significant stenosis. 2.  Aortic Atherosclerosis (ICD10-I70.0). 3. 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 Minimal change since 2015.  NON-VASCULAR  1. Colonic diverticula with mild wall thickening in the sigmoid colon. However, there is no pericolonic inflammation in this area suggesting that these findings could be related to colonic diverticulosis or under distension of the sigmoid colon. No focal colonic or bowel inflammation.  These results will be called to the ordering clinician or representative by the Radiologist Assistant, and communication documented in the PACS or [employer].

## 2019-08-22 NOTE — Telephone Encounter (Signed)
Order placed

## 2019-08-22 NOTE — Progress Notes (Signed)
Subjective:  Patient ID: [name], female    DOB: Sep 09, 1955  Age: 64 y.o. MRN: [id number]  CC: Diagnoses of Colitis with rectal bleeding, Type 2 diabetes mellitus without complication, without long-term current use of insulin ([place]), Hyperlipidemia associated with type 2 diabetes mellitus ([place]), Irritable bowel syndrome with diarrhea, and Hematochezia were pertinent to this visit.  HPI SHAYLEN MOWRY presents for abdominal pain , diarrhea  64 yr old female with history of IBS, SVT, ischemic colitis, type 2 DM presents with 12 hour history of severe lower abdominal cramping pain started late yesterday evening,  Accompanied by dry heaves and pudding consistency stools.  Symptoms lasted nearly 8 hours,  Started having blood in stools early this morning.  Sent for STAT CT angio and pelvis this morning in [place]. CT reassuring ; no evidence of thrombosis  diverticulitis .  Patient seen in office afterward,  Was feeling considerably better; able to drive herself to and from imaging facility.  Has been able to tolerate liquids this morning.  No stool or flatus since  7 am.  Denies orthostatic symptoms. Pain now 1/10 (previusly 8/10 when she started taking levsin )  Outpatient Medications Prior to Visit  Medication Sig Dispense Refill  . benzonatate (TESSALON) 200 MG capsule TAKE 1 CAPSULE BY MOUTH 3 TIMES DAILY AS NEEDED FOR COUGH. 60 capsule 3  . cyclobenzaprine (FLEXERIL) 10 MG tablet TAKE 1/2 TO 1 TABLET BY MOUTH 3 TIMES A DAY AS NEEDED FOR BACK PAIN 180 tablet 2  . dicyclomine (BENTYL) 10 MG capsule Take 1 capsule (10 mg total) by mouth 3 (three) times daily before meals. 120 capsule 3  . diltiazem (CARDIZEM) 30 MG tablet Take 30 mg by mouth daily as needed (SVT).     [name] diltiazem (CARTIA XT) 240 MG 24 hr capsule Take 240 mg by mouth daily.    . diphenhydrAMINE (BENADRYL) 25 MG tablet As needed for allergies.     . diphenoxylate-atropine (LOMOTIL) 2.5-0.025 MG tablet TAKE ONE TABLET BY  MOUTH AS NEEDED 30 tablet 2  . estradiol (ESTRACE) 0.1 MG/GM vaginal cream PLACE 1/4 APPLICATORFUL  VAGINALLY DAILY. 42.5 g 11  . hyoscyamine (LEVSIN SL) 0.125 MG SL tablet Place 1 tablet (0.125 mg total) under the tongue every 4 (four) hours as needed for cramping. 60 tablet 5  . levothyroxine (SYNTHROID) 25 MCG tablet TAKE 1 TABLET BY MOUTH DAILY BEFORE BREAKFAST 90 tablet 0  . metoprolol succinate (TOPROL-XL) 50 MG 24 hr tablet Take 50 mg by mouth daily. Take with or immediately following a meal.     . omeprazole (PRILOSEC OTC) 20 MG tablet Take 20 mg by mouth daily.      [name] PHENOHYTRO 16.2 MG tablet TAKE 1 TABLET BY MOUTH EVERY 8 HOURS AS NEEDED. 120 tablet 0  . fluconazole (DIFLUCAN) 150 MG tablet TAKE 1 TABLET (150 MG TOTAL) BY MOUTH DAILY FOR 2 DAYS (Patient not taking: Reported on 08/22/2019) 2 tablet 2   No facility-administered medications prior to visit.    Review of Systems;  Patient denies headache, fevers, malaise, unintentional weight loss, skin rash, eye pain, sinus congestion and sinus pain, sore throat, dysphagia,  hemoptysis , cough, dyspnea, wheezing, chest pain, palpitations, orthopnea, edema,\ melena, , constipation, flank pain, dysuria, hematuria, urinary  Frequency, nocturia, numbness, tingling, seizures,  Focal weakness, Loss of consciousness,  Tremor, insomnia, depression, anxiety, and suicidal ideation.      Objective:  BP 140/82   Pulse 90   Temp 97.8 F (  36.6 C)   Resp 12   Ht 5\' 8"  (1.727 m)   Wt (!) 304 lb (137.9 kg)   SpO2 98%   BMI 46.22 kg/m   BP Readings from Last 3 Encounters:  08/22/19 140/82  05/24/19 126/74  06/22/18 110/70    Wt Readings from Last 3 Encounters:  08/22/19 (!) 304 lb (137.9 kg)  05/24/19 (!) 309 lb 12.8 oz (140.5 kg)  06/22/18 (!) 303 lb 6.4 oz (137.6 kg)    General appearance: alert, cooperative and appears stated age Ears: normal TM's and external ear canals both ears Throat: lips, mucosa, and tongue normal; teeth and  gums normal Neck: no adenopathy, no carotid bruit, supple, symmetrical, trachea midline and thyroid not enlarged, symmetric, no tenderness/mass/nodules Back: symmetric, no curvature. ROM normal. No CVA tenderness. Lungs: clear to auscultation bilaterally Heart: regular rate and rhythm, S1, S2 normal, no murmur, click, rub or gallop Abdomen: soft, non-tender; bowel sounds quiet but present ; no masses,  no organomegaly Pulses: 2+ and symmetric Skin: Skin color, texture, turgor normal. No rashes or lesions Lymph nodes: Cervical, supraclavicular, and axillary nodes normal.  Lab Results  Component Value Date   HGBA1C 6.5 (H) 05/24/2019   HGBA1C 6.5 06/09/2018   HGBA1C 7.1 (H) 06/15/2017    Lab Results  Component Value Date   CREATININE 0.84 08/22/2019   CREATININE 0.83 05/24/2019   CREATININE 0.89 08/14/2018    Lab Results  Component Value Date   WBC 6.7 08/14/2018   HGB 13.5 08/14/2018   HCT 40.6 08/14/2018   PLT 186.0 08/14/2018   GLUCOSE 142 (H) 08/22/2019   CHOL 213 (H) 08/14/2018   TRIG 126.0 08/14/2018   HDL 41.90 08/14/2018   LDLDIRECT 146 (H) 05/24/2019   LDLCALC 145 (H) 08/14/2018   ALT 18 08/22/2019   AST 16 08/22/2019   NA 137 08/22/2019   K 4.3 08/22/2019   CL 102 08/22/2019   CREATININE 0.84 08/22/2019   BUN 17 08/22/2019   CO2 28 08/22/2019   TSH 2.63 05/24/2019   INR 1.1 02/13/2014   HGBA1C 6.5 (H) 05/24/2019   MICROALBUR 0.2 05/24/2019    CT Angio Abd/Pel w/ and/or w/o  Result Date: 08/22/2019 CLINICAL DATA:  64 year old with abdominal pain and bloody diarrhea. History of ischemic colitis. EXAM: CT ANGIOGRAPHY ABDOMEN AND PELVIS WITH CONTRAST AND WITHOUT CONTRAST TECHNIQUE: Multidetector CT imaging of the abdomen and pelvis was performed using the standard protocol during bolus administration of intravenous contrast. Multiplanar reconstructed images and MIPs were obtained and reviewed to evaluate the vascular anatomy. CONTRAST:  145mL OMNIPAQUE IOHEXOL  350 MG/ML SOLN COMPARISON:  CT abdomen and pelvis 02/14/2014 FINDINGS: VASCULAR Aorta: Mild atherosclerotic disease in the abdominal aorta without aneurysm or dissection. Celiac: Patent without evidence of aneurysm, dissection, vasculitis or significant stenosis. SMA: Patent without evidence of aneurysm, dissection, vasculitis or significant stenosis. Renals: Small amount of calcified plaque at the origin of the left renal artery without significant stenosis. No evidence for dissection or aneurysm involving the left renal artery. Right renal artery is widely patent without significant stenosis. There is a small saccular aneurysm or focal ectasia involving a branch of the right renal artery in the renal hilum. This measures up to 6 mm on sequence 4, image 63 and minimally changed since 2015. IMA: Patent. Inflow: Patent without evidence of aneurysm, dissection, vasculitis or significant stenosis. Proximal Outflow: Proximal femoral arteries are patent bilaterally. Veins: Portal venous system is patent. Normal appearance of the iliac veins and IVC. Renal  veins are patent. Hepatic veins are patent. Review of the MIP images confirms the above findings. NON-VASCULAR Lower chest: Lung bases are clear. Hepatobiliary: Normal appearance of the liver. Normal appearance of the gallbladder without biliary dilatation. Pancreas: Unremarkable. No pancreatic ductal dilatation or surrounding inflammatory changes. Spleen: Normal in size without focal abnormality. Adrenals/Urinary Tract: Normal adrenal glands. Kidneys are slightly lobulated and similar to the previous examination. No hydronephrosis and no suspicious renal lesion. Normal appearance of the urinary bladder. Stomach/Bowel: Mild wall thickening and diverticula involving the sigmoid colon without surrounding inflammatory changes. The wall thickening could be related to under distension. No evidence for a bowel obstruction. Normal appearance of the stomach. Lymphatic: No  abdominopelvic lymphadenopathy. Reproductive: Uterus and bilateral adnexa are unremarkable. Other: Negative for ascites.  Negative for free air. Musculoskeletal: Sclerosis and degenerative changes at the symphysis pubis and suggestive for degenerative changes. IMPRESSION: VASCULAR 1. Main visceral arteries are patent without significant stenosis. 2.  Aortic Atherosclerosis (ICD10-I70.0). 3. 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 Minimal change since 2015. NON-VASCULAR 1. Colonic diverticula with mild wall thickening in the sigmoid colon. However, there is no pericolonic inflammation in this area suggesting that these findings could be related to colonic diverticulosis or under distension of the sigmoid colon. No focal colonic or bowel inflammation. These results will be called to the ordering clinician or representative by the Radiologist Assistant, and communication documented in the PACS or [employer]. Electronically Signed   By: Markus Daft M.D.   On: 08/22/2019 11:07    Assessment & Plan:   Problem List Items Addressed This Visit      Unprioritized   Hematochezia    Likely due to internal hemorrhoid irritation from recurrent diarrhea. Cbc pending       IBS (irritable bowel syndrome)    Vs transient colitis.  Symptoms resolving.  Ischemic colitis unlikely given normal STAT  CT angio.  Lytes , renal function normal. Not acidotic . Refilling zoloft and phenergan.  Orthostatics borderline. Advised to rest and rehydrate with electrolyte replacement fluids.       Relevant Medications   ondansetron (ZOFRAN ODT) 4 MG disintegrating tablet   Type 2 diabetes mellitus without complications ([place])    Other Visit Diagnoses    Colitis with rectal bleeding       Hyperlipidemia associated with type 2 diabetes mellitus ([place])          I am having Tiffiney C. Deegan start on ondansetron and promethazine. I am also having her maintain her  diphenhydrAMINE, omeprazole, diltiazem, diltiazem, metoprolol succinate, estradiol, cyclobenzaprine, diphenoxylate-atropine, Phenohytro, fluconazole, hyoscyamine, dicyclomine, benzonatate, and levothyroxine.  Meds ordered this encounter  Medications  . ondansetron (ZOFRAN ODT) 4 MG disintegrating tablet    Sig: Take 1 tablet (4 mg total) by mouth every 8 (eight) hours as needed for nausea or vomiting.    Dispense:  20 tablet    Refill:  0  . promethazine (PHENERGAN) 12.5 MG tablet    Sig: Take 1 tablet (12.5 mg total) by mouth every 8 (eight) hours as needed for nausea or vomiting.    Dispense:  20 tablet    Refill:  0    There are no discontinued medications.  Follow-up: No follow-ups on file.   Crecencio Mc, MD

## 2019-08-22 NOTE — Assessment & Plan Note (Addendum)
Vs transient colitis.  Symptoms resolving.  Ischemic colitis unlikely given normal STAT  CT angio.  Lytes , renal function normal. Not acidotic . Refilling zoloft and phenergan.  Orthostatics borderline. Advised to rest and rehydrate with electrolyte replacement fluids.

## 2019-08-22 NOTE — Telephone Encounter (Signed)
Patient has been having bloody diarrhea for the last several hours and severe lower abdominal pain for the last 8 hours  She has a histor y  of ischemic colitis. I have added her to my schedule for 12:00 noon and ordered a CT angio of abd/pelvis

## 2019-08-23 DIAGNOSIS — K529 Noninfective gastroenteritis and colitis, unspecified: Secondary | ICD-10-CM

## 2019-08-23 LAB — D-DIMER, QUANTITATIVE: D-Dimer, Quant: 0.82 mcg/mL FEU — ABNORMAL HIGH (ref <0.50)

## 2019-08-26 ENCOUNTER — Telehealth: Payer: Self-pay | Admitting: Internal Medicine

## 2019-08-29 NOTE — Telephone Encounter (Signed)
Ok. Thank you.

## 2019-09-19 ENCOUNTER — Other Ambulatory Visit: Payer: Self-pay | Admitting: Internal Medicine

## 2019-10-11 ENCOUNTER — Other Ambulatory Visit: Payer: Self-pay | Admitting: Internal Medicine

## 2019-10-17 ENCOUNTER — Other Ambulatory Visit: Payer: Self-pay | Admitting: Cardiology

## 2019-10-17 DIAGNOSIS — R072 Precordial pain: Secondary | ICD-10-CM

## 2019-12-13 ENCOUNTER — Other Ambulatory Visit: Payer: Self-pay | Admitting: Internal Medicine

## 2020-01-31 ENCOUNTER — Ambulatory Visit (INDEPENDENT_AMBULATORY_CARE_PROVIDER_SITE_OTHER): Payer: Medicare Other

## 2020-01-31 ENCOUNTER — Other Ambulatory Visit: Payer: Self-pay

## 2020-01-31 DIAGNOSIS — Z23 Encounter for immunization: Secondary | ICD-10-CM

## 2020-02-20 ENCOUNTER — Other Ambulatory Visit: Payer: Self-pay | Admitting: Cardiology

## 2020-03-09 ENCOUNTER — Other Ambulatory Visit: Payer: Self-pay | Admitting: Internal Medicine

## 2020-04-20 ENCOUNTER — Other Ambulatory Visit: Payer: Medicare Other

## 2020-04-20 DIAGNOSIS — Z20822 Contact with and (suspected) exposure to covid-19: Secondary | ICD-10-CM

## 2020-04-21 LAB — NOVEL CORONAVIRUS, NAA: SARS-CoV-2, NAA: NOT DETECTED

## 2020-04-21 LAB — SARS-COV-2, NAA 2 DAY TAT

## 2020-04-27 ENCOUNTER — Ambulatory Visit: Payer: Medicare Other | Attending: Internal Medicine

## 2020-04-27 DIAGNOSIS — Z23 Encounter for immunization: Secondary | ICD-10-CM

## 2020-04-27 NOTE — Progress Notes (Signed)
   Covid-19 Vaccination Clinic  Name:  Tiffany Wells    MRN: [id number] DOB: [birth year]  04/27/2020  [name] was observed post Covid-19 immunization for 15 minutes without incident. She was provided with Vaccine Information Sheet and instruction to access the V-Safe system.   [name] was instructed to call 911 with any severe reactions post vaccine: [name] Difficulty breathing  . Swelling of face and throat  . A fast heartbeat  . A bad rash all over body  . Dizziness and weakness   Immunizations Administered    Name Date Dose VIS Date Route   Pfizer COVID-19 Vaccine 04/27/2020  3:28 PM 0.3 mL 02/05/2020 Intramuscular   Manufacturer: [place]   Lot: [id number]   [place]: [id number]

## 2020-05-19 ENCOUNTER — Other Ambulatory Visit: Payer: Self-pay | Admitting: Internal Medicine

## 2020-05-21 ENCOUNTER — Telehealth: Payer: Self-pay

## 2020-05-21 NOTE — Telephone Encounter (Signed)
Prior Authorization for Cyclobenzaprine has been approved. Attempted to contact Tiffany Wells to inform her. Phone was answered for each call but after introducing myself, [name] disconnected the call. Could not inform her of the approval or leave a call back number.

## 2020-05-28 ENCOUNTER — Ambulatory Visit (INDEPENDENT_AMBULATORY_CARE_PROVIDER_SITE_OTHER): Payer: Medicare Other

## 2020-05-28 VITALS — BP 106/70 | HR 80 | Ht 68.0 in | Wt 304.0 lb

## 2020-05-28 DIAGNOSIS — Z1159 Encounter for screening for other viral diseases: Secondary | ICD-10-CM

## 2020-05-28 DIAGNOSIS — E119 Type 2 diabetes mellitus without complications: Secondary | ICD-10-CM | POA: Diagnosis not present

## 2020-05-28 DIAGNOSIS — Z0000 Encounter for general adult medical examination without abnormal findings: Secondary | ICD-10-CM

## 2020-05-28 NOTE — Progress Notes (Signed)
Subjective:   Tiffany Wells is a 65 y.o. female who presents for an Initial Medicare Annual Wellness Visit.  Review of Systems    No ROS.  Medicare Wellness Virtual Visit.   Cardiac Risk Factors include: advanced age (>55mn, >>57women);hypertension;diabetes mellitus     Objective:    Today's Vitals   05/28/20 1335  BP: 106/70  Pulse: 80  Weight: (!) 304 lb (137.9 kg)  Height: '5\' 8"'  (1.727 m)   Body mass index is 46.22 kg/m.  Advanced Directives 05/28/2020  Does Patient Have a Medical Advance Directive? Yes  Type of AParamedicof [place]University [facility] [place] will  Does patient want to make changes to medical advance directive? No - Patient declined  Copy of HJetin Chart? No - copy requested    Current Medications (verified) Outpatient Encounter Medications as of 05/28/2020  Medication Sig  . benzonatate (TESSALON) 200 MG capsule TAKE 1 CAPSULE BY MOUTH 3 TIMES DAILY AS NEEDED FOR COUGH.  . cyclobenzaprine (FLEXERIL) 10 MG tablet TAKE 1/2 TO 1 TABLET BY MOUTH 3 TIMES A DAY AS NEEDED FOR BACK PAIN  . dicyclomine (BENTYL) 10 MG capsule Take 1 capsule (10 mg total) by mouth 3 (three) times daily before meals.  .Marland Kitchendiltiazem (CARDIZEM) 30 MG tablet Take 30 mg by mouth daily as needed (SVT).   .Marland Kitchendiltiazem (CARTIA XT) 240 MG 24 hr capsule Take 240 mg by mouth daily.  . diphenhydrAMINE (BENADRYL) 25 MG tablet As needed for allergies.   . diphenoxylate-atropine (LOMOTIL) 2.5-0.025 MG tablet TAKE ONE TABLET BY MOUTH AS NEEDED  . estradiol (ESTRACE) 0.1 MG/GM vaginal cream PLACE 1/4 APPLICATORFUL VAGINALLY DAILY.  . fluconazole (DIFLUCAN) 150 MG tablet TAKE 1 TABLET (150 MG TOTAL) BY MOUTH DAILY FOR 2 DAYS (Patient not taking: Reported on 08/22/2019)  . hyoscyamine (LEVSIN SL) 0.125 MG SL tablet Place 1 tablet (0.125 mg total) under the tongue every 4 (four) hours as needed for cramping.  .Marland Kitchenlevothyroxine (SYNTHROID) 25 MCG tablet TAKE 1 TABLET BY  MOUTH DAILY BEFORE BREAKFAST  . metoprolol succinate (TOPROL-XL) 50 MG 24 hr tablet Take 50 mg by mouth daily. Take with or immediately following a meal.   . omeprazole (PRILOSEC OTC) 20 MG tablet Take 20 mg by mouth daily.    . ondansetron (ZOFRAN ODT) 4 MG disintegrating tablet Take 1 tablet (4 mg total) by mouth every 8 (eight) hours as needed for nausea or vomiting.  .Marland KitchenPHENOHYTRO 16.2 MG tablet TAKE 1 TABLET BY MOUTH EVERY 8 HOURS AS NEEDED.  .Marland Kitchenpromethazine (PHENERGAN) 12.5 MG tablet Take 1 tablet (12.5 mg total) by mouth every 8 (eight) hours as needed for nausea or vomiting.   No facility-administered encounter medications on file as of 05/28/2020.    Allergies (verified) Codeine, Codeine sulfate, Sulfa antibiotics, and Tape   History: Past Medical History:  Diagnosis Date  . Acute pulmonary embolism ([facility] 08/06/2013   Diagnosed during hospitalization for TKR.  Patient received heparin followed by Xarelto whic hwill continue for a total of 6 months.   [facility] has recommended IVC placement prior to next TKR and bridging with Lovenox (June 17  [place])    . Arthritis    osteoarthritis of knee  . Back pain   . DVT (deep venous thrombosis) ([facility] 12/28/2016  . Dysrhythmia 12/28/2016   atrial fibrillation, SVT  . Headache    Migraines  . History of Salmonella gastroenteritis 2012  . Hypertension   . IBS (irritable  bowel syndrome)   . Internal hemorrhoids   . Ischemic colitis ([place]) 02/21/2014  . Normal cardiac stress test    2006, nromal stress cardiolite, EF 65%  . Pulmonary embolism ([place])    post knee surgery   Past Surgical History:  Procedure Laterality Date  . COLONOSCOPY  03/07/2011   Int Hemms  . COLONOSCOPY WITH PROPOFOL N/A 02/20/2017   Procedure: COLONOSCOPY WITH PROPOFOL;  Surgeon: Manya Silvas, MD;  Location: [facility] ENDOSCOPY;  Service: Endoscopy;  Laterality: N/A;  . HYSTEROSCOPY    . JOINT REPLACEMENT     Bilateral TKR  . ROTATOR CUFF REPAIR    . TUBAL LIGATION     . UPPER GASTROINTESTINAL ENDOSCOPY  03/07/2011   HH Multip Gastric Polyps/ Dr Vira Agar   Family History  Problem Relation Age of Onset  . Hypertension Mother   . Diabetes Mother   . Cancer Mother        glioma  . Hypertension Father   . Heart disease Father 43       CAD with CABG  . Cancer Father        multiple myeloma  . Arthritis Sister   . Hypertension Sister   . Cancer Brother        Pre-cancerous lip lesion, testicular CA   . Cancer Maternal Grandmother        Lung Ca, nonsmoker    Social History   Socioeconomic History  . Marital status: Married    Spouse name: Not on file  . Number of children: 2  . Years of education: Not on file  . Highest education level: Not on file  Occupational History  . Occupation: Glass blower/designer, [employer]  Tobacco Use  . Smoking status: Never Smoker  . Smokeless tobacco: Never Used  Vaping Use  . Vaping Use: Never used  Substance and Sexual Activity  . Alcohol use: Yes    Alcohol/week: 2.0 standard drinks    Types: 2 Glasses of wine per week    Comment: very rare  . Drug use: No  . Sexual activity: Not on file  Other Topics Concern  . Not on file  Social History Narrative   Lives with husband.   Social Determinants of Health   Financial Resource Strain: Low Risk   . Difficulty of Paying Living Expenses: Not hard at all  Food Insecurity: No Food Insecurity  . Worried About Charity fundraiser in the Last Year: Never true  . Ran Out of Food in the Last Year: Never true  Transportation Needs: No Transportation Needs  . Lack of Transportation (Medical): No  . Lack of Transportation (Non-Medical): No  Physical Activity: Not on file  Stress: No Stress Concern Present  . Feeling of Stress : Not at all  Social Connections: Unknown  . Frequency of Communication with Friends and Family: More than three times a week  . Frequency of Social Gatherings with Friends and Family: More than three times a week  . Attends  Religious Services: Not on file  . Active Member of Clubs or Organizations: Not on file  . Attends Archivist Meetings: Not on file  . Marital Status: Married    Tobacco Counseling Counseling given: Not Answered   Clinical Intake:  Pre-visit preparation completed: Yes        Diabetes: Yes (Followed by pcp)  How often do you need to have someone help you when you read instructions, pamphlets, or other written materials from your doctor  or pharmacy?: 1 - Never   Nutrition Risk Assessment: Has the patient had any N/V/D within the last 2 weeks?  No  Does the patient have any non-healing wounds?  No  Has the patient had any unintentional weight loss or weight gain?  No   Financial Strains and Diabetes Management: Does the patient want to be seen by Chronic Care Management for management of their diabetes?  No  Would the patient like to be referred to a Nutritionist or for Diabetic Management?  No   Interpreter Needed?: No    Activities of Daily Living In your present state of health, do you have any difficulty performing the following activities: 05/28/2020  Hearing? N  Vision? N  Difficulty concentrating or making decisions? N  Walking or climbing stairs? N  Dressing or bathing? N  Doing errands, shopping? N  Preparing Food and eating ? N  Using the Toilet? N  In the past six months, have you accidently leaked urine? N  Do you have problems with loss of bowel control? N  Managing your Medications? N  Managing your Finances? N  Housekeeping or managing your Housekeeping? N  Some recent data might be hidden    Patient Care Team: Crecencio Mc, MD as PCP - General (Internal Medicine)  Indicate any recent Medical Services you may have received from other than [facility] providers in the past year (date may be approximate).     Assessment:   This is a routine wellness examination for [facility].  I connected with [place] today by telephone and verified that I am  speaking with the correct person using two identifiers. Location patient: home Location provider: work Persons participating in the virtual visit: patient, Marine scientist.    I discussed the limitations, risks, security and privacy concerns of performing an evaluation and management service by telephone and the availability of in person appointments. The patient expressed understanding and verbally consented to this telephonic visit.    Interactive audio and video telecommunications were attempted between this provider and patient, however failed, due to patient having technical difficulties OR patient did not have access to video capability.  We continued and completed visit with audio only.  Some vital signs may be absent or patient reported.   Hearing/Vision screen  Hearing Screening   '125Hz'  '250Hz'  '500Hz'  '1000Hz'  '2000Hz'  '3000Hz'  '4000Hz'  '6000Hz'  '8000Hz'   Right ear:           Left ear:           Comments: Patient is able to hear conversational tones without difficulty.  No issues reported.    Vision Screening Comments: Wears corrective lenses Visual acuity not assessed, virtual visit.  They have seen their ophthalmologist in the last 12 months.   Dietary issues and exercise activities discussed: Current Exercise Habits: The patient does not participate in regular exercise at present  Healthy diet  Good water intake  Goals    . Follow up with Primary Care Provider     As needed      Depression Screen PHQ 2/9 Scores 05/28/2020 05/11/2017 04/26/2012  PHQ - 2 Score 0 0 0  PHQ- 9 Score - 3 -    Fall Risk Fall Risk  05/28/2020 08/22/2019 05/24/2019 04/26/2012  Falls in the past year? 0 0 0 No  Number falls in past yr: 0 - - -  Injury with Fall? 0 - - -  Follow up Falls evaluation completed Falls evaluation completed Falls evaluation completed -    FALL RISK  PREVENTION PERTAINING TO THE HOME: Handrails in use when climbing stairs? Yes Home free of loose throw rugs in walkways, pet beds, electrical  cords, etc? Yes  Adequate lighting in your home to reduce risk of falls? Yes   ASSISTIVE DEVICES UTILIZED TO PREVENT FALLS: Use of a cane, walker or w/c? No   TIMED UP AND GO: Was the test performed? No . Virtual visit.   Cognitive Function:  Patient is alert and oriented x3.  Denies difficulty focusing, making decisions, memory loss.  Enjoys reading, playing cards, and other brain health activities.  MMSE/6CIT deferred. Normal by direct communication/observation.      Immunizations Immunization History  Administered Date(s) Administered  . Fluad Quad(high Dose 65+) 01/31/2020  . Influenza Split 01/01/2013, 01/05/2014  . Influenza-Unspecified 01/17/2012, 01/18/2017, 01/21/2018, 01/24/2019  . PFIZER(Purple Top)SARS-COV-2 Vaccination 05/02/2019, 05/23/2019, 04/27/2020  . Pneumococcal Polysaccharide-23 06/22/2018  . Zoster 10/22/2013  . Zoster Recombinat (Shingrix) 09/05/2018, 11/20/2018    TDAP status: Due, Education has been provided regarding the importance of this vaccine. Advised may receive this vaccine at local pharmacy or Health Dept. Aware to provide a copy of the vaccination record if obtained from local pharmacy or Health Dept. Verbalized acceptance and understanding. Deferred.   Prevnar 13 vaccine- Advised may receive this vaccine in office, at local pharmacy or Health Dept. Aware to provide a copy of the vaccination record if obtained from local pharmacy or Health Dept. Verbalized acceptance and understanding. Deferred.   Health Maintenance Health Maintenance  Topic Date Due  . Hepatitis C Screening  Never done  . HEMOGLOBIN A1C  02/22/2020  . FOOT EXAM  05/23/2020  . URINE MICROALBUMIN  05/23/2020  . TETANUS/TDAP  05/28/2021 (Originally 04/26/2020)  . PNA vac Low Risk Adult (1 of 2 - PCV13) 05/28/2021 (Originally 06/22/2019)  . MAMMOGRAM  06/20/2020  . OPHTHALMOLOGY EXAM  01/30/2021  . COLONOSCOPY (Pts 45-19yr Insurance coverage will need to be confirmed)   02/21/2027  . INFLUENZA VACCINE  Completed  . DEXA SCAN  Completed  . COVID-19 Vaccine  Completed   Colorectal cancer screening: Type of screening: Colonoscopy. Completed 02/20/17. Repeat every 10 years   Mammogram status: Completed 06/21/19. Repeat every year.  Bone density- 06/21/19.  Lung Cancer Screening: (Low Dose CT Chest recommended if Age 67-80years, 30 pack-year currently smoking OR have quit w/in 15years.) does not qualify.   Hepatitis C Screening: does qualify. Consent given to order.   Vision Screening: Recommended annual ophthalmology exams for early detection of glaucoma and other disorders of the eye. Is the patient up to date with their annual eye exam?  Yes  Who is the provider or what is the name of the office in which the patient attends annual eye exams? Dr. BGloriann LoanI Dental Screening: Recommended annual dental exams for proper oral hygiene. Visits every 6 months.  Community Resource Referral / Chronic Care Management: CRR required this visit?  No   CCM required this visit?  No      Plan:   Keep all routine maintenance appointments.   Patient plans to schedule follow up appointment with pcp after appointment with Cardiologist.   Labs- Hepatitis C Screening, A1C ordered.   I have personally reviewed and noted the following in the patient's chart:   . Medical and social history . Use of alcohol, tobacco or illicit drugs  . Current medications and supplements . Functional ability and status . Nutritional status . Physical activity . Advanced directives . List of other physicians . Hospitalizations,  surgeries, and ER visits in previous 12 months . Vitals . Screenings to include cognitive, depression, and falls . Referrals and appointments  In addition, I have reviewed and discussed with patient certain preventive protocols, quality metrics, and best practice recommendations. A written personalized care plan for preventive services as well as general  preventive health recommendations were provided to patient via mychart.     Varney Biles, LPN   1/89/8421

## 2020-05-28 NOTE — Patient Instructions (Addendum)
Tiffany Wells , Thank you for taking time to come for your Medicare Wellness Visit. I appreciate your ongoing commitment to your health goals. Please review the following plan we discussed and let me know if I can assist you in the future.   These are the goals we discussed: Goals    . Follow up with Primary Care Provider     As needed       This is a list of the screening recommended for you and due dates:  Health Maintenance  Topic Date Due  .  Hepatitis C: One time screening is recommended by Center for Disease Control  (CDC) for  adults born from [birth year] through 1967.   Never done  . Hemoglobin A1C  02/22/2020  . Complete foot exam   05/23/2020  . Urine Protein Check  05/23/2020  . Tetanus Vaccine  05/28/2021*  . Pneumonia vaccines (1 of 2 - PCV13) 05/28/2021*  . Mammogram  06/20/2020  . Eye exam for diabetics  01/30/2021  . Colon Cancer Screening  02/21/2027  . Flu Shot  Completed  . DEXA scan (bone density measurement)  Completed  . COVID-19 Vaccine  Completed  *Topic was postponed. The date shown is not the original due date.    Immunizations Immunization History  Administered Date(s) Administered  . Fluad Quad(high Dose 65+) 01/31/2020  . Influenza Split 01/01/2013, 01/05/2014  . Influenza-Unspecified 01/17/2012, 01/18/2017, 01/21/2018, 01/24/2019  . PFIZER(Purple Top)SARS-COV-2 Vaccination 05/02/2019, 05/23/2019, 04/27/2020  . Pneumococcal Polysaccharide-23 06/22/2018  . Zoster 10/22/2013  . Zoster Recombinat (Shingrix) 09/05/2018, 11/20/2018   Advanced directives: End of life planning; Advance aging; Advanced directives discussed.  Copy of current HCPOA/Living Will requested.    Conditions/risks identified: none new  Follow up in one year for your annual wellness visit    Preventive Care 65 Years and Older, Female Preventive care refers to lifestyle choices and visits with your health care provider that can promote health and wellness. What does preventive care  include?  A yearly physical exam. This is also called an annual well check.  Dental exams once or twice a year.  Routine eye exams. Ask your health care provider how often you should have your eyes checked.  Personal lifestyle choices, including:  Daily care of your teeth and gums.  Regular physical activity.  Eating a healthy diet.  Avoiding tobacco and drug use.  Limiting alcohol use.  Practicing safe sex.  Taking low-dose aspirin every day.  Taking vitamin and mineral supplements as recommended by your health care provider. What happens during an annual well check? The services and screenings done by your health care provider during your annual well check will depend on your age, overall health, lifestyle risk factors, and family history of disease. Counseling  Your health care provider may ask you questions about your:  Alcohol use.  Tobacco use.  Drug use.  Emotional well-being.  Home and relationship well-being.  Sexual activity.  Eating habits.  History of falls.  Memory and ability to understand (cognition).  Work and work Statistician.  Reproductive health. Screening  You may have the following tests or measurements:  Height, weight, and BMI.  Blood pressure.  Lipid and cholesterol levels. These may be checked every 5 years, or more frequently if you are over 46 years old.  Skin check.  Lung cancer screening. You may have this screening every year starting at age 72 if you have a 30-pack-year history of smoking and currently smoke or have quit within the past  15 years.  Fecal occult blood test (FOBT) of the stool. You may have this test every year starting at age 47.  Flexible sigmoidoscopy or colonoscopy. You may have a sigmoidoscopy every 5 years or a colonoscopy every 10 years starting at age 17.  Hepatitis C blood test.  Hepatitis B blood test.  Sexually transmitted disease (STD) testing.  Diabetes screening. This is done by  checking your blood sugar (glucose) after you have not eaten for a while (fasting). You may have this done every 1-3 years.  Bone density scan. This is done to screen for osteoporosis. You may have this done starting at age 29.  Mammogram. This may be done every 1-2 years. Talk to your health care provider about how often you should have regular mammograms. Talk with your health care provider about your test results, treatment options, and if necessary, the need for more tests. Vaccines  Your health care provider may recommend certain vaccines, such as:  Influenza vaccine. This is recommended every year.  Tetanus, diphtheria, and acellular pertussis (Tdap, Td) vaccine. You may need a Td booster every 10 years.  Zoster vaccine. You may need this after age 69.  Pneumococcal 13-valent conjugate (PCV13) vaccine. One dose is recommended after age 55.  Pneumococcal polysaccharide (PPSV23) vaccine. One dose is recommended after age 33. Talk to your health care provider about which screenings and vaccines you need and how often you need them. This information is not intended to replace advice given to you by your health care provider. Make sure you discuss any questions you have with your health care provider. Document Released: 05/01/2015 Document Revised: 12/23/2015 Document Reviewed: 02/03/2015 Elsevier Interactive Patient Education  2017 [place] Prevention in the Home Falls can cause injuries. They can happen to people of all ages. There are many things you can do to make your home safe and to help prevent falls. What can I do on the outside of my home?  Regularly fix the edges of walkways and driveways and fix any cracks.  Remove anything that might make you trip as you walk through a door, such as a raised step or threshold.  Trim any bushes or trees on the path to your home.  Use bright outdoor lighting.  Clear any walking paths of anything that might make someone trip,  such as rocks or tools.  Regularly check to see if handrails are loose or broken. Make sure that both sides of any steps have handrails.  Any raised decks and porches should have guardrails on the edges.  Have any leaves, snow, or ice cleared regularly.  Use sand or salt on walking paths during winter.  Clean up any spills in your garage right away. This includes oil or grease spills. What can I do in the bathroom?  Use night lights.  Install grab bars by the toilet and in the tub and shower. Do not use towel bars as grab bars.  Use non-skid mats or decals in the tub or shower.  If you need to sit down in the shower, use a plastic, non-slip stool.  Keep the floor dry. Clean up any water that spills on the floor as soon as it happens.  Remove soap buildup in the tub or shower regularly.  Attach bath mats securely with double-sided non-slip rug tape.  Do not have throw rugs and other things on the floor that can make you trip. What can I do in the bedroom?  Use night lights.  Make sure that you have a light by your bed that is easy to reach.  Do not use any sheets or blankets that are too big for your bed. They should not hang down onto the floor.  Have a firm chair that has side arms. You can use this for support while you get dressed.  Do not have throw rugs and other things on the floor that can make you trip. What can I do in the kitchen?  Clean up any spills right away.  Avoid walking on wet floors.  Keep items that you use a lot in easy-to-reach places.  If you need to reach something above you, use a strong step stool that has a grab bar.  Keep electrical cords out of the way.  Do not use floor polish or wax that makes floors slippery. If you must use wax, use non-skid floor wax.  Do not have throw rugs and other things on the floor that can make you trip. What can I do with my stairs?  Do not leave any items on the stairs.  Make sure that there are  handrails on both sides of the stairs and use them. Fix handrails that are broken or loose. Make sure that handrails are as long as the stairways.  Check any carpeting to make sure that it is firmly attached to the stairs. Fix any carpet that is loose or worn.  Avoid having throw rugs at the top or bottom of the stairs. If you do have throw rugs, attach them to the floor with carpet tape.  Make sure that you have a light switch at the top of the stairs and the bottom of the stairs. If you do not have them, ask someone to add them for you. What else can I do to help prevent falls?  Wear shoes that:  Do not have high heels.  Have rubber bottoms.  Are comfortable and fit you well.  Are closed at the toe. Do not wear sandals.  If you use a stepladder:  Make sure that it is fully opened. Do not climb a closed stepladder.  Make sure that both sides of the stepladder are locked into place.  Ask someone to hold it for you, if possible.  Clearly mark and make sure that you can see:  Any grab bars or handrails.  First and last steps.  Where the edge of each step is.  Use tools that help you move around (mobility aids) if they are needed. These include:  Canes.  Walkers.  Scooters.  Crutches.  Turn on the lights when you go into a dark area. Replace any light bulbs as soon as they burn out.  Set up your furniture so you have a clear path. Avoid moving your furniture around.  If any of your floors are uneven, fix them.  If there are any pets around you, be aware of where they are.  Review your medicines with your doctor. Some medicines can make you feel dizzy. This can increase your chance of falling. Ask your doctor what other things that you can do to help prevent falls. This information is not intended to replace advice given to you by your health care provider. Make sure you discuss any questions you have with your health care provider. Document Released: 01/29/2009  Document Revised: 09/10/2015 Document Reviewed: 05/09/2014 Elsevier Interactive Patient Education  2017 [employer].

## 2020-06-11 ENCOUNTER — Other Ambulatory Visit: Payer: Self-pay | Admitting: Internal Medicine

## 2020-07-15 ENCOUNTER — Other Ambulatory Visit: Payer: Self-pay | Admitting: Cardiology

## 2020-07-18 ENCOUNTER — Other Ambulatory Visit: Payer: Self-pay

## 2020-07-18 MED FILL — Metoprolol Succinate Tab ER 24HR 25 MG (Tartrate Equiv): ORAL | 30 days supply | Qty: 60 | Fill #0 | Status: AC

## 2020-07-31 ENCOUNTER — Other Ambulatory Visit: Payer: Self-pay | Admitting: Internal Medicine

## 2020-07-31 DIAGNOSIS — U071 COVID-19: Secondary | ICD-10-CM | POA: Insufficient documentation

## 2020-07-31 DIAGNOSIS — Z8616 Personal history of COVID-19: Secondary | ICD-10-CM | POA: Insufficient documentation

## 2020-07-31 MED ORDER — PREDNISONE 10 MG PO TABS: ORAL_TABLET | ORAL | 0 refills | Status: DC

## 2020-07-31 MED ORDER — HYDROCOD POLST-CPM POLST ER 10-8 MG/5ML PO SUER: 5.0000 mL | Freq: Every evening | ORAL | 0 refills | Status: DC | PRN

## 2020-07-31 MED ORDER — AMOXICILLIN-POT CLAVULANATE 875-125 MG PO TABS: 1.0000 | ORAL_TABLET | Freq: Two times a day (BID) | ORAL | 0 refills | Status: DC

## 2020-07-31 NOTE — Assessment & Plan Note (Signed)
Diagnosed at home after developing symptoms on Monday April 11. Patient had travelled to [facility] the week prior and returned on April 10.  Sympomts included body aches,  Low grade fevers to 101,  And persistent cough.  Prednisone, tussionex and augmentin sent to [facility] with parameters for starting augmentin given .

## 2020-08-06 ENCOUNTER — Other Ambulatory Visit: Payer: Self-pay | Admitting: Internal Medicine

## 2020-08-06 ENCOUNTER — Other Ambulatory Visit: Payer: Self-pay

## 2020-08-06 MED ORDER — DICYCLOMINE HCL 10 MG PO CAPS
ORAL_CAPSULE | ORAL | 3 refills | Status: DC
  Filled 2020-08-06: qty 120, 40d supply, fill #0
  Filled 2020-11-25: qty 120, 40d supply, fill #1
  Filled 2021-03-05: qty 120, 40d supply, fill #2
  Filled 2021-07-25 – 2021-07-27 (×4): qty 120, 40d supply, fill #3

## 2020-08-06 MED ORDER — FLUCONAZOLE 150 MG PO TABS
150.0000 mg | ORAL_TABLET | Freq: Every day | ORAL | 2 refills | Status: DC
  Filled 2020-08-06: qty 2, 2d supply, fill #0
  Filled 2020-10-30: qty 2, 2d supply, fill #1

## 2020-08-07 ENCOUNTER — Other Ambulatory Visit: Payer: Self-pay

## 2020-08-08 ENCOUNTER — Other Ambulatory Visit: Payer: Self-pay | Admitting: Internal Medicine

## 2020-08-08 MED ORDER — PREDNISONE 10 MG PO TABS: ORAL_TABLET | ORAL | 0 refills | Status: DC

## 2020-08-08 MED ORDER — BENZONATATE 200 MG PO CAPS: 200.0000 mg | ORAL_CAPSULE | Freq: Three times a day (TID) | ORAL | 3 refills | Status: DC | PRN

## 2020-08-17 ENCOUNTER — Other Ambulatory Visit: Payer: Self-pay

## 2020-08-17 MED FILL — Diltiazem HCl Coated Beads Cap ER 24HR 240 MG: ORAL | 90 days supply | Qty: 90 | Fill #0 | Status: AC

## 2020-08-21 ENCOUNTER — Other Ambulatory Visit: Payer: Self-pay | Admitting: Internal Medicine

## 2020-08-21 LAB — HEMOGLOBIN A1C: Hemoglobin A1C: 8

## 2020-08-21 MED ORDER — METFORMIN HCL ER 500 MG PO TB24: 500.0000 mg | ORAL_TABLET | Freq: Every day | ORAL | 1 refills | Status: DC

## 2020-08-23 MED FILL — Metoprolol Succinate Tab ER 24HR 25 MG (Tartrate Equiv): ORAL | 30 days supply | Qty: 60 | Fill #1 | Status: AC

## 2020-08-24 ENCOUNTER — Other Ambulatory Visit: Payer: Self-pay

## 2020-08-24 LAB — TSH: TSH: 2.3 (ref 0.41–5.90)

## 2020-08-24 NOTE — Telephone Encounter (Signed)
PT called in to advise that she was advise to make a appointment to be seen in 4 weeks however looking at the 4 week mark nothing is available till the 24th of June. PT wants to know if its fine to wait till then or what can be done to be seen earlier.

## 2020-09-07 ENCOUNTER — Other Ambulatory Visit: Payer: Self-pay

## 2020-09-07 ENCOUNTER — Encounter: Payer: Self-pay | Admitting: Internal Medicine

## 2020-09-07 ENCOUNTER — Ambulatory Visit (INDEPENDENT_AMBULATORY_CARE_PROVIDER_SITE_OTHER): Payer: Medicare Other | Admitting: Internal Medicine

## 2020-09-07 VITALS — BP 108/68 | HR 97 | Temp 98.1°F | Ht 68.0 in | Wt 294.2 lb

## 2020-09-07 DIAGNOSIS — E039 Hypothyroidism, unspecified: Secondary | ICD-10-CM

## 2020-09-07 DIAGNOSIS — Z8616 Personal history of COVID-19: Secondary | ICD-10-CM

## 2020-09-07 DIAGNOSIS — Z23 Encounter for immunization: Secondary | ICD-10-CM

## 2020-09-07 DIAGNOSIS — E1165 Type 2 diabetes mellitus with hyperglycemia: Secondary | ICD-10-CM

## 2020-09-07 DIAGNOSIS — Z1231 Encounter for screening mammogram for malignant neoplasm of breast: Secondary | ICD-10-CM

## 2020-09-07 DIAGNOSIS — E119 Type 2 diabetes mellitus without complications: Secondary | ICD-10-CM

## 2020-09-07 DIAGNOSIS — IMO0002 Reserved for concepts with insufficient information to code with codable children: Secondary | ICD-10-CM

## 2020-09-07 DIAGNOSIS — E118 Type 2 diabetes mellitus with unspecified complications: Secondary | ICD-10-CM | POA: Diagnosis not present

## 2020-09-07 LAB — MICROALBUMIN / CREATININE URINE RATIO
Creatinine,U: 291.9 mg/dL
Microalb Creat Ratio: 0.6 mg/g (ref 0.0–30.0)
Microalb, Ur: 1.7 mg/dL (ref 0.0–1.9)

## 2020-09-07 MED ORDER — DIPHTHERIA-TETANUS TOXOIDS DT 25-5 LFU/0.5ML IM SUSP: INTRAMUSCULAR | 0 refills | Status: DC

## 2020-09-07 NOTE — Patient Instructions (Signed)
Mammogram ordered   Repeat labs on or after August 9

## 2020-09-07 NOTE — Progress Notes (Signed)
Subjective:  Patient ID: Tiffany Wells, female    DOB: 09-05-1955  Age: 65 y.o. MRN: [id number]  CC: The primary encounter diagnosis was Encounter for screening mammogram for malignant neoplasm of breast. Diagnoses of Type 2 diabetes mellitus without complication, without long-term current use of insulin (Tiffany Wells), Need for pneumococcal vaccination, and Acquired hypothyroidism were also pertinent to this visit.  HPI Tiffany Wells presents for follow up on type 2 DM.   This visit occurred during the SARS-CoV-2 public health emergency.  Safety protocols were in place, including screening questions prior to the visit, additional usage of staff PPE, and extensive cleaning of exam room while observing appropriate contact time as indicated for disinfecting solutions.     [name] was last seen in office May 2021 at which time Type 2 DM was diagnosed with A1c of 7.0. Pharmacologic  treatment was deferred.  Recently noted to have unintentional weight loss and repeat labs  done by her cardiologist noted hyperglycemia and A1c of 8.0 .  She started taking metformin 500 mg bid approximately 2 weeks ago.  She is tolerating the medication with improvement in loose stools by  adjusting her evening dosing to mid dinner and adding one Imodium daily.  She has been checking BS once daily in the morning in a fasting state and has reduced the complex carbohydrates in her diet.  Fasting sugars have been < 130 for the past 2 weeks. Her last eye exam was several weeks ago by Tiffany Wells and no retinopathy has been noted.   SVT/atrial tachycardia:  The results of a recent 7 day holter monitor are  unknown.  completed May 13.  She was referred to OSA testing and completed 2 day Sleep study the  same week at home .  Did not sleep well , barely got 4 hours. Results are pending   Getting a vague abd discomfort at night  Relieved by eating and by Tums.  Weight has improved from 310 lbs to 296 to 292 lbs (all done at [employer] office).  No history of NSAID or alcohol use.    No appetite in the morning .  Doesn't eat until 2 pm.  Cheese and berries in the afternoon.  Low glycemic index dinner     Outpatient Medications Prior to Visit  Medication Sig Dispense Refill  . benzonatate (TESSALON) 200 MG capsule Take 1 capsule (200 mg total) by mouth 3 (three) times daily as needed for cough. 60 capsule 3  . cyclobenzaprine (FLEXERIL) 10 MG tablet TAKE 1/2 TO 1 TABLET BY MOUTH 3 TIMES A DAY AS NEEDED FOR BACK PAIN 180 tablet 2  . dicyclomine (BENTYL) 10 MG capsule TAKE 1 CAPSULE BY MOUTH 3 TIMES DAILY BEFORE MEALS. 120 capsule 3  . diltiazem (CARDIZEM CD) 240 MG 24 hr capsule TAKE 1 CAPSULE BY MOUTH ONCE DAILY 90 capsule 3  . diphenhydrAMINE (BENADRYL) 25 MG tablet As needed for allergies.    . diphenoxylate-atropine (LOMOTIL) 2.5-0.025 MG tablet TAKE ONE TABLET BY MOUTH AS NEEDED 30 tablet 2  . estradiol (ESTRACE) 0.1 MG/GM vaginal cream PLACE 1/4 APPLICATORFUL VAGINALLY DAILY. 42.5 g 9  . hyoscyamine (LEVSIN SL) 0.125 MG SL tablet Place 1 tablet (0.125 mg total) under the tongue every 4 (four) hours as needed for cramping. 60 tablet 5  . levothyroxine (SYNTHROID) 25 MCG tablet TAKE 1 TABLET BY MOUTH DAILY BEFORE BREAKFAST 90 tablet 0  . metFORMIN (GLUCOPHAGE XR) 500 MG 24 hr tablet Take 1  tablet (500 mg total) by mouth daily with breakfast. 90 tablet 1  . metoprolol succinate (TOPROL-XL) 25 MG 24 hr tablet TAKE 1 TABLET (25 MG TOTAL) BY MOUTH 2 (TWO) TIMES DAILY 60 tablet 11  . omeprazole (PRILOSEC OTC) 20 MG tablet Take 20 mg by mouth daily.    . ondansetron (ZOFRAN ODT) 4 MG disintegrating tablet Take 1 tablet (4 mg total) by mouth every 8 (eight) hours as needed for nausea or vomiting. 20 tablet 0  . PHENOHYTRO 16.2 MG tablet TAKE 1 TABLET BY MOUTH EVERY 8 HOURS AS NEEDED. 120 tablet 0  . promethazine (PHENERGAN) 12.5 MG tablet Take 1 tablet (12.5 mg total) by mouth every 8 (eight) hours as needed for nausea or  vomiting. 20 tablet 0  . diltiazem (CARDIZEM) 30 MG tablet Take 30 mg by mouth daily as needed (SVT).     . fluconazole (DIFLUCAN) 150 MG tablet TAKE 1 TABLET (150 MG TOTAL) BY MOUTH DAILY FOR 2 DAYS (Patient not taking: Reported on 09/07/2020) 2 tablet 2  . metoprolol succinate (TOPROL-XL) 50 MG 24 hr tablet TAKE 1 TABLET BY MOUTH 2 TIMES DAILY 180 tablet 1  . amoxicillin-clavulanate (AUGMENTIN) 875-125 MG tablet Take 1 tablet by mouth 2 (two) times daily. (Patient not taking: Reported on 09/07/2020) 14 tablet 0  . chlorpheniramine-HYDROcodone (TUSSIONEX PENNKINETIC ER) 10-8 MG/5ML SUER Take 5 mLs by mouth at bedtime as needed. (Patient not taking: Reported on 09/07/2020) 140 mL 0  . diltiazem (CARDIZEM CD) 240 MG 24 hr capsule Take 240 mg by mouth daily. (Patient not taking: Reported on 09/07/2020)    . metoprolol succinate (TOPROL-XL) 25 MG 24 hr tablet Take 1 tablet by mouth 2 (two) times daily. (Patient not taking: Reported on 09/07/2020)    . metoprolol succinate (TOPROL-XL) 50 MG 24 hr tablet Take 50 mg by mouth daily. Take with or immediately following a meal.  (Patient not taking: Reported on 09/07/2020)    . predniSONE (DELTASONE) 10 MG tablet 6 tablets on Day 1 , then reduce by 1 tablet daily until gone (Patient not taking: Reported on 09/07/2020) 21 tablet 0  . predniSONE (DELTASONE) 10 MG tablet 6 tablets daily for 3 days, then reduce by 1 tablet daily until gone (Patient not taking: Reported on 09/07/2020) 33 tablet 0   No facility-administered medications prior to visit.    Review of Systems;  Patient denies headache, fevers, malaise, unintentional weight loss, skin rash, eye pain, sinus congestion and sinus pain, sore throat, dysphagia,  hemoptysis , cough, dyspnea, wheezing, chest pain, palpitations, orthopnea, edema, abdominal pain, nausea, melena, diarrhea, constipation, flank pain, dysuria, hematuria, urinary  Frequency, nocturia, numbness, tingling, seizures,  Focal weakness, Loss of  consciousness,  Tremor, insomnia, depression, anxiety, and suicidal ideation.      Objective:  BP 108/68 (BP Location: Left Arm, Patient Position: Sitting, Cuff Size: Large)   Pulse 97   Temp 98.1 F (36.7 C) (Oral)   Ht $R'5\' 8"'ej$  (1.727 m)   Wt 294 lb 3.2 oz (133.4 kg)   SpO2 98%   BMI 44.73 kg/m   BP Readings from Last 3 Encounters:  09/07/20 108/68  05/28/20 106/70  08/22/19 140/82    Wt Readings from Last 3 Encounters:  09/07/20 294 lb 3.2 oz (133.4 kg)  05/28/20 (!) 304 lb (137.9 kg)  08/22/19 (!) 304 lb (137.9 kg)    General appearance: alert, cooperative and appears stated age Ears: normal TM's and external ear canals both ears Throat: lips, mucosa, and  tongue normal; teeth and gums normal Neck: no adenopathy, no carotid bruit, supple, symmetrical, trachea midline and thyroid not enlarged, symmetric, no tenderness/mass/nodules Back: symmetric, no curvature. ROM normal. No CVA tenderness. Lungs: clear to auscultation bilaterally Heart: regular rate and rhythm, S1, S2 normal, no murmur, click, rub or gallop Abdomen: soft, non-tender; bowel sounds normal; no masses,  no organomegaly Pulses: 2+ and symmetric Skin: Skin color, texture, turgor normal. No rashes or lesions Lymph nodes: Cervical, supraclavicular, and axillary nodes normal.  Lab Results  Component Value Date   HGBA1C 8.0 08/21/2020   HGBA1C 6.7 (H) 08/22/2019   HGBA1C 6.5 (H) 05/24/2019    Lab Results  Component Value Date   CREATININE 0.84 08/22/2019   CREATININE 0.83 05/24/2019   CREATININE 0.89 08/14/2018    Lab Results  Component Value Date   WBC 8.5 08/22/2019   HGB 13.5 08/22/2019   HCT 40.5 08/22/2019   PLT 231.0 08/22/2019   GLUCOSE 142 (H) 08/22/2019   CHOL 228 (H) 08/22/2019   TRIG 152.0 (H) 08/22/2019   HDL 45.70 08/22/2019   LDLDIRECT 146 (H) 05/24/2019   LDLCALC 152 (H) 08/22/2019   ALT 18 08/22/2019   AST 16 08/22/2019   NA 137 08/22/2019   K 4.3 08/22/2019   CL 102  08/22/2019   CREATININE 0.84 08/22/2019   BUN 17 08/22/2019   CO2 28 08/22/2019   TSH 2.30 08/24/2020   INR 1.1 02/13/2014   HGBA1C 8.0 08/21/2020   MICROALBUR 1.7 09/07/2020    CT Angio Abd/Pel w/ and/or w/o  Result Date: 08/22/2019 CLINICAL DATA:  65 year old with abdominal pain and bloody diarrhea. History of ischemic colitis. EXAM: CT ANGIOGRAPHY ABDOMEN AND PELVIS WITH CONTRAST AND WITHOUT CONTRAST TECHNIQUE: Multidetector CT imaging of the abdomen and pelvis was performed using the standard protocol during bolus administration of intravenous contrast. Multiplanar reconstructed images and MIPs were obtained and reviewed to evaluate the vascular anatomy. CONTRAST:  171m OMNIPAQUE IOHEXOL 350 MG/ML SOLN COMPARISON:  CT abdomen and pelvis 02/14/2014 FINDINGS: VASCULAR Aorta: Mild atherosclerotic disease in the abdominal aorta without aneurysm or dissection. Celiac: Patent without evidence of aneurysm, dissection, vasculitis or significant stenosis. SMA: Patent without evidence of aneurysm, dissection, vasculitis or significant stenosis. Renals: Small amount of calcified plaque at the origin of the left renal artery without significant stenosis. No evidence for dissection or aneurysm involving the left renal artery. Right renal artery is widely patent without significant stenosis. There is a small saccular aneurysm or focal ectasia involving a branch of the right renal artery in the renal hilum. This measures up to 6 mm on sequence 4, image 63 and minimally changed since 2015. IMA: Patent. Inflow: Patent without evidence of aneurysm, dissection, vasculitis or significant stenosis. Proximal Outflow: Proximal femoral arteries are patent bilaterally. Veins: Portal venous system is patent. Normal appearance of the iliac veins and IVC. Renal veins are patent. Hepatic veins are patent. Review of the MIP images confirms the above findings. NON-VASCULAR Lower chest: Lung bases are clear. Hepatobiliary: Normal  appearance of the liver. Normal appearance of the gallbladder without biliary dilatation. Pancreas: Unremarkable. No pancreatic ductal dilatation or surrounding inflammatory changes. Spleen: Normal in size without focal abnormality. Adrenals/Urinary Tract: Normal adrenal glands. Kidneys are slightly lobulated and similar to the previous examination. No hydronephrosis and no suspicious renal lesion. Normal appearance of the urinary bladder. Stomach/Bowel: Mild wall thickening and diverticula involving the sigmoid colon without surrounding inflammatory changes. The wall thickening could be related to under distension. No evidence for  a bowel obstruction. Normal appearance of the stomach. Lymphatic: No abdominopelvic lymphadenopathy. Reproductive: Uterus and bilateral adnexa are unremarkable. Other: Negative for ascites.  Negative for free air. Musculoskeletal: Sclerosis and degenerative changes at the symphysis pubis and suggestive for degenerative changes. IMPRESSION: VASCULAR 1. Main visceral arteries are patent without significant stenosis. 2.  Aortic Atherosclerosis (ICD10-I70.0). 3. 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 Minimal change since 2015. NON-VASCULAR 1. Colonic diverticula with mild wall thickening in the sigmoid colon. However, there is no pericolonic inflammation in this area suggesting that these findings could be related to colonic diverticulosis or under distension of the sigmoid colon. No focal colonic or bowel inflammation. These results will be called to the ordering clinician or representative by the Radiologist Assistant, and communication documented in the PACS or [employer]. Electronically Signed   By: Markus Daft M.D.   On: 08/22/2019 11:07    Assessment & Plan:   Problem List Items Addressed This Visit      Unprioritized   Acquired hypothyroidism    She did not tolerate the smallest dose of levothyroxine due to  tachycardia.  Most recent TSH was normal in [facility] n 2 weeks ago       Type 2 diabetes mellitus without complications ([place])   Relevant Orders   Hemoglobin A1c   Comp Met (CMET)   Lipid panel   Microalbumin / creatinine urine ratio (Completed)    Other Visit Diagnoses    Encounter for screening mammogram for malignant neoplasm of breast    -  Primary   Relevant Orders   MM DIGITAL SCREENING BILATERAL   Need for pneumococcal vaccination       Relevant Orders   Pneumococcal conjugate vaccine 20-valent (Prevnar 20) (Completed)      I provided  30 minutes of  face-to-face time during this encounter reviewing patient's r, and [facility] cardiology visit,  Labs,  Recent blood sugar readings  providing counseling on the above mentioned problems , and coordination  of care .  I have discontinued Pascha C. Rothe's predniSONE, amoxicillin-clavulanate, chlorpheniramine-HYDROcodone, and predniSONE. I am also having her start on Diphtheria-Tetanus Toxoids DT. Additionally, I am having her maintain her diphenhydrAMINE, omeprazole, diltiazem, diphenoxylate-atropine, Phenohytro, hyoscyamine, ondansetron, promethazine, metoprolol succinate, levothyroxine, cyclobenzaprine, diltiazem, metoprolol succinate, estradiol, dicyclomine, fluconazole, benzonatate, and metFORMIN.  Meds ordered this encounter  Medications  . Diphtheria-Tetanus Toxoids DT 25-5 LFU/0.5ML SUSP    Sig: Give one dose IM    Dispense:  0.5 mL    Refill:  0    Medications Discontinued During This Encounter  Medication Reason  . predniSONE (DELTASONE) 10 MG tablet   . predniSONE (DELTASONE) 10 MG tablet   . metoprolol succinate (TOPROL-XL) 50 MG 24 hr tablet   . metoprolol succinate (TOPROL-XL) 25 MG 24 hr tablet   . diltiazem (CARDIZEM CD) 240 MG 24 hr capsule   . amoxicillin-clavulanate (AUGMENTIN) 875-125 MG tablet   . chlorpheniramine-HYDROcodone (TUSSIONEX PENNKINETIC ER) 10-8 MG/5ML SUER     Follow-up: No follow-ups  on file.   Crecencio Mc, MD

## 2020-09-08 ENCOUNTER — Other Ambulatory Visit: Payer: Self-pay

## 2020-09-08 MED ORDER — LEVOTHYROXINE SODIUM 25 MCG PO TABS
ORAL_TABLET | Freq: Every day | ORAL | 0 refills | Status: DC
  Filled 2020-09-08: qty 90, 90d supply, fill #0

## 2020-09-08 NOTE — Assessment & Plan Note (Addendum)
Hyperglycemia has improved with initiation of metformin.  We discussed adding Ozempic at a later date if weight plateaus, but for now will continue metformin XR  500 mg daily.  Foot exam is normal and she is up to date on eye exams.  She has no proteinuria and will return for fasting lipids in August   Lab Results  Component Value Date   HGBA1C 8.0 08/21/2020   Lab Results  Component Value Date   LABMICR See below: 07/04/2017   LABMICR See below: 01/25/2016   MICROALBUR 1.7 09/07/2020   MICROALBUR 0.2 05/24/2019   Lab Results  Component Value Date   CHOL 228 (H) 08/22/2019   HDL 45.70 08/22/2019   LDLCALC 152 (H) 08/22/2019   LDLDIRECT 146 (H) 05/24/2019   TRIG 152.0 (H) 08/22/2019   CHOLHDL 5 08/22/2019

## 2020-09-08 NOTE — Assessment & Plan Note (Addendum)
Occurred 6 weeks ago,  All symptoms have resolved.

## 2020-09-08 NOTE — Assessment & Plan Note (Addendum)
Sheis tolerating  the smallest dose of levothyroxine   Most recent TSH was normal in [facility] n 2 weeks ago  No changes to therapy  Lab Results  Component Value Date   TSH 2.30 08/24/2020

## 2020-09-08 NOTE — Assessment & Plan Note (Addendum)
With type 2 DM diagnosed in 2021.  She is losing weight ; continue metformin, low GI diet, and consider addition of Ozempic if weight plateaus.  Participation in low impact aerobic exercise encouraged.

## 2020-09-09 ENCOUNTER — Other Ambulatory Visit: Payer: Self-pay

## 2020-09-09 MED FILL — Cyclobenzaprine HCl Tab 10 MG: ORAL | 60 days supply | Qty: 180 | Fill #0 | Status: AC

## 2020-10-16 MED FILL — Metoprolol Succinate Tab ER 24HR 25 MG (Tartrate Equiv): ORAL | 30 days supply | Qty: 60 | Fill #2 | Status: AC

## 2020-10-20 ENCOUNTER — Other Ambulatory Visit: Payer: Self-pay

## 2020-10-30 ENCOUNTER — Other Ambulatory Visit: Payer: Self-pay

## 2020-10-30 ENCOUNTER — Other Ambulatory Visit: Payer: Self-pay | Admitting: Internal Medicine

## 2020-10-30 MED ORDER — ESTRADIOL 0.1 MG/GM VA CREA
TOPICAL_CREAM | VAGINAL | 9 refills | Status: AC
  Filled 2020-10-30: qty 42.5, 90d supply, fill #0
  Filled 2021-05-19: qty 42.5, 42d supply, fill #1
  Filled 2021-07-26: qty 42.5, 42d supply, fill #2

## 2020-11-02 ENCOUNTER — Other Ambulatory Visit: Payer: Self-pay | Admitting: Internal Medicine

## 2020-11-02 MED ORDER — AZITHROMYCIN 500 MG PO TABS: 500.0000 mg | ORAL_TABLET | Freq: Every day | ORAL | 0 refills | Status: DC

## 2020-11-02 MED ORDER — HYDROCOD POLST-CPM POLST ER 10-8 MG/5ML PO SUER: 5.0000 mL | Freq: Every evening | ORAL | 0 refills | Status: DC | PRN

## 2020-11-04 ENCOUNTER — Ambulatory Visit
Admission: RE | Admit: 2020-11-04 | Discharge: 2020-11-04 | Disposition: A | Discharge status: Home / Self Care | Payer: Medicare Other | Source: Ambulatory Visit | Attending: Internal Medicine | Admitting: Internal Medicine

## 2020-11-04 ENCOUNTER — Telehealth: Payer: Self-pay

## 2020-11-04 ENCOUNTER — Ambulatory Visit
Admission: RE | Admit: 2020-11-04 | Discharge: 2020-11-04 | Disposition: A | Discharge status: Home / Self Care | Payer: Medicare Other | Attending: Internal Medicine | Admitting: Internal Medicine

## 2020-11-04 ENCOUNTER — Other Ambulatory Visit: Payer: Medicare Other

## 2020-11-04 ENCOUNTER — Other Ambulatory Visit: Payer: Self-pay | Admitting: Internal Medicine

## 2020-11-04 DIAGNOSIS — Z1152 Encounter for screening for COVID-19: Secondary | ICD-10-CM

## 2020-11-04 DIAGNOSIS — R059 Cough, unspecified: Secondary | ICD-10-CM

## 2020-11-04 NOTE — Telephone Encounter (Signed)
Order has been placed for covid testing this afternoon

## 2020-11-04 NOTE — Addendum Note (Signed)
Addended by: Adair Laundry on: 11/04/2020 02:39 PM   Modules accepted: Orders

## 2020-11-05 LAB — COVID-19, FLU A+B AND RSV
Influenza A, NAA: NOT DETECTED
Influenza B, NAA: NOT DETECTED
RSV, NAA: NOT DETECTED
SARS-CoV-2, NAA: NOT DETECTED

## 2020-11-17 ENCOUNTER — Other Ambulatory Visit: Payer: Self-pay

## 2020-11-17 MED FILL — Diltiazem HCl Coated Beads Cap ER 24HR 240 MG: ORAL | 90 days supply | Qty: 90 | Fill #1 | Status: AC

## 2020-11-25 ENCOUNTER — Other Ambulatory Visit: Payer: Self-pay | Admitting: Internal Medicine

## 2020-11-25 ENCOUNTER — Other Ambulatory Visit: Payer: Self-pay

## 2020-11-25 MED ORDER — LEVOTHYROXINE SODIUM 25 MCG PO TABS
ORAL_TABLET | Freq: Every day | ORAL | 0 refills | Status: DC
  Filled 2020-11-25: qty 90, 90d supply, fill #0

## 2020-11-25 MED FILL — Metoprolol Succinate Tab ER 24HR 25 MG (Tartrate Equiv): ORAL | 30 days supply | Qty: 60 | Fill #3 | Status: AC

## 2020-11-26 ENCOUNTER — Other Ambulatory Visit: Payer: Self-pay

## 2020-12-01 ENCOUNTER — Other Ambulatory Visit: Payer: Self-pay

## 2020-12-01 ENCOUNTER — Other Ambulatory Visit (INDEPENDENT_AMBULATORY_CARE_PROVIDER_SITE_OTHER): Payer: Medicare Other

## 2020-12-01 DIAGNOSIS — E118 Type 2 diabetes mellitus with unspecified complications: Secondary | ICD-10-CM

## 2020-12-01 DIAGNOSIS — IMO0002 Reserved for concepts with insufficient information to code with codable children: Secondary | ICD-10-CM

## 2020-12-01 DIAGNOSIS — E1165 Type 2 diabetes mellitus with hyperglycemia: Secondary | ICD-10-CM

## 2020-12-01 LAB — COMPREHENSIVE METABOLIC PANEL
ALT: 13 U/L (ref 0–35)
AST: 12 U/L (ref 0–37)
Albumin: 3.6 g/dL (ref 3.5–5.2)
Alkaline Phosphatase: 70 U/L (ref 39–117)
BUN: 14 mg/dL (ref 6–23)
CO2: 28 mEq/L (ref 19–32)
Calcium: 8.8 mg/dL (ref 8.4–10.5)
Chloride: 103 mEq/L (ref 96–112)
Creatinine, Ser: 0.89 mg/dL (ref 0.40–1.20)
GFR: 67.23 mL/min (ref >60.00)
Glucose, Bld: 134 mg/dL — ABNORMAL HIGH (ref 70–99)
Potassium: 4.1 mEq/L (ref 3.5–5.1)
Sodium: 141 mEq/L (ref 135–145)
Total Bilirubin: 0.5 mg/dL (ref 0.2–1.2)
Total Protein: 5.9 g/dL — ABNORMAL LOW (ref 6.0–8.3)

## 2020-12-01 LAB — LIPID PANEL
Cholesterol: 211 mg/dL — ABNORMAL HIGH (ref 0–200)
HDL: 39.1 mg/dL (ref >39.00)
LDL Cholesterol: 141 mg/dL — ABNORMAL HIGH (ref 0–99)
NonHDL: 171.73
Total CHOL/HDL Ratio: 5
Triglycerides: 152 mg/dL — ABNORMAL HIGH (ref 0.0–149.0)
VLDL: 30.4 mg/dL (ref 0.0–40.0)

## 2020-12-01 LAB — HEMOGLOBIN A1C: Hgb A1c MFr Bld: 6 % (ref 4.6–6.5)

## 2020-12-02 NOTE — Telephone Encounter (Signed)
Please advise 

## 2020-12-28 ENCOUNTER — Telehealth (INDEPENDENT_AMBULATORY_CARE_PROVIDER_SITE_OTHER): Payer: Medicare Other | Admitting: Internal Medicine

## 2020-12-28 ENCOUNTER — Other Ambulatory Visit: Payer: Self-pay

## 2020-12-28 ENCOUNTER — Encounter: Payer: Self-pay | Admitting: Internal Medicine

## 2020-12-28 DIAGNOSIS — E1159 Type 2 diabetes mellitus with other circulatory complications: Secondary | ICD-10-CM

## 2020-12-28 DIAGNOSIS — K580 Irritable bowel syndrome with diarrhea: Secondary | ICD-10-CM

## 2020-12-28 DIAGNOSIS — I700 Atherosclerosis of aorta: Secondary | ICD-10-CM | POA: Diagnosis not present

## 2020-12-28 DIAGNOSIS — E669 Obesity, unspecified: Secondary | ICD-10-CM

## 2020-12-28 DIAGNOSIS — E1169 Type 2 diabetes mellitus with other specified complication: Secondary | ICD-10-CM | POA: Diagnosis not present

## 2020-12-28 DIAGNOSIS — I152 Hypertension secondary to endocrine disorders: Secondary | ICD-10-CM

## 2020-12-28 MED ORDER — ROSUVASTATIN CALCIUM 5 MG PO TABS
5.0000 mg | ORAL_TABLET | Freq: Every day | ORAL | 1 refills | Status: DC
  Filled 2020-12-28: qty 30, 30d supply, fill #0
  Filled 2021-07-25: qty 30, 30d supply, fill #1

## 2020-12-28 MED ORDER — TIRZEPATIDE 2.5 MG/0.5ML ~~LOC~~ SOAJ
2.5000 mg | SUBCUTANEOUS | 2 refills | Status: DC
  Filled 2020-12-28 – 2020-12-29 (×2): qty 2, 28d supply, fill #0

## 2020-12-28 NOTE — Progress Notes (Signed)
Virtual Visit via [place] Note  This visit type was conducted due to national recommendations for restrictions regarding the COVID-19 pandemic (e.g. social distancing).  This format is felt to be most appropriate for this patient at this time.  All issues noted in this document were discussed and addressed.  No physical exam was performed (except for noted visual exam findings with Video Visits).   I connected withNAME@ on 12/28/20 at  4:30 PM EDT by a video enabled telemedicine application and verified that I am speaking with the correct person using two identifiers. Location patient: home Location provider: work or home office Persons participating in the virtual visit: patient, provider  I discussed the limitations, risks, security and privacy concerns of performing an evaluation and management service by telephone and the availability of in person appointments. I also discussed with the patient that there may be a patient responsible charge related to this service. The patient expressed understanding and agreed to proceed. Reason for visit: follow up on diabetes , obesity and aortic atherosclerosis   HPI:  65 yr old female with type 2 DM /obesity recently started on metformin therapy.  Patient states that she has been taking metformin XR at the lowest dose and has lost 21 lbs since starting the medication 3 months ago. Howver, she is having frequent fecal urgency bordering on incontinence , which has only worsened as her blood sugars have improved.     Aortic arch atherosclerosis:  Reviewed findings of prior CT scan from May 2021.   Patient  Is not taking a statin.  Discussed the role of statin therapy in stablizing placque and preventing events.      ROS: See pertinent positives and negatives per HPI.  Past Medical History:  Diagnosis Date   Acute pulmonary embolism ([place]) 08/06/2013   Diagnosed during hospitalization for TKR.  Patient received heparin followed by Xarelto whic hwill  continue for a total of 6 months.   AVVS has recommended IVC placement prior to next TKR and bridging with Lovenox (June 17  [place])     Arthritis    osteoarthritis of knee   Back pain    DVT (deep venous thrombosis) ([place]) 12/28/2016   Dysrhythmia 12/28/2016   atrial fibrillation, SVT   Headache    Migraines   History of Salmonella gastroenteritis 2012   Hypertension    IBS (irritable bowel syndrome)    Internal hemorrhoids    Ischemic colitis ([place]) 02/21/2014   Normal cardiac stress test    2006, nromal stress cardiolite, EF 65%   Pulmonary embolism ([place])    post knee surgery    Past Surgical History:  Procedure Laterality Date   COLONOSCOPY  03/07/2011   Int Hemms   COLONOSCOPY WITH PROPOFOL N/A 02/20/2017   Procedure: COLONOSCOPY WITH PROPOFOL;  Surgeon: Manya Silvas, MD;  Location[facility] ENDOSCOPY;  Service: Endoscopy;  Laterality: N/A;   HYSTEROSCOPY     JOINT REPLACEMENT     Bilateral TKR   ROTATOR CUFF REPAIR     TUBAL LIGATION     UPPER GASTROINTESTINAL ENDOSCOPY  03/07/2011   HH Multip Gastric Polyps/ Dr Vira Agar    Family History  Problem Relation Age of Onset   Hypertension Mother    Diabetes Mother    Cancer Mother        glioma   Hypertension Father    Heart disease Father 21       CAD with CABG   Cancer Father  multiple myeloma   Arthritis Sister    Hypertension Sister    Cancer Brother        Pre-cancerous lip lesion, testicular CA    Cancer Maternal Grandmother        Lung Ca, nonsmoker     SOCIAL HX:  reports that she has never smoked. She has never used smokeless tobacco. She reports current alcohol use of about 2.0 standard drinks per week. She reports that she does not use drugs.    Current Outpatient Medications:    cyclobenzaprine (FLEXERIL) 10 MG tablet, TAKE 1/2 TO 1 TABLET BY MOUTH 3 TIMES A DAY AS NEEDED FOR BACK PAIN, Disp: 180 tablet, Rfl: 2   dicyclomine (BENTYL) 10 MG capsule, TAKE 1 CAPSULE BY MOUTH 3 TIMES DAILY BEFORE  MEALS., Disp: 120 capsule, Rfl: 3   diltiazem (CARDIZEM CD) 240 MG 24 hr capsule, TAKE 1 CAPSULE BY MOUTH ONCE DAILY, Disp: 90 capsule, Rfl: 3   diltiazem (CARDIZEM) 30 MG tablet, Take 30 mg by mouth daily as needed (SVT). , Disp: , Rfl:    diphenhydrAMINE (BENADRYL) 25 MG tablet, As needed for allergies., Disp: , Rfl:    diphenoxylate-atropine (LOMOTIL) 2.5-0.025 MG tablet, TAKE ONE TABLET BY MOUTH AS NEEDED, Disp: 30 tablet, Rfl: 2   estradiol (ESTRACE) 0.1 MG/GM vaginal cream, PLACE 1/4 APPLICATORFUL VAGINALLY DAILY., Disp: 42.5 g, Rfl: 9   hyoscyamine (LEVSIN SL) 0.125 MG SL tablet, Place 1 tablet (0.125 mg total) under the tongue every 4 (four) hours as needed for cramping., Disp: 60 tablet, Rfl: 5   levothyroxine (SYNTHROID) 25 MCG tablet, TAKE 1 TABLET BY MOUTH DAILY BEFORE BREAKFAST, Disp: 90 tablet, Rfl: 0   metoprolol succinate (TOPROL-XL) 25 MG 24 hr tablet, TAKE 1 TABLET (25 MG TOTAL) BY MOUTH 2 (TWO) TIMES DAILY, Disp: 60 tablet, Rfl: 11   omeprazole (PRILOSEC OTC) 20 MG tablet, Take 20 mg by mouth daily., Disp: , Rfl:    ondansetron (ZOFRAN ODT) 4 MG disintegrating tablet, Take 1 tablet (4 mg total) by mouth every 8 (eight) hours as needed for nausea or vomiting., Disp: 20 tablet, Rfl: 0   PHENOHYTRO 16.2 MG tablet, TAKE 1 TABLET BY MOUTH EVERY 8 HOURS AS NEEDED., Disp: 120 tablet, Rfl: 0   promethazine (PHENERGAN) 12.5 MG tablet, Take 1 tablet (12.5 mg total) by mouth every 8 (eight) hours as needed for nausea or vomiting., Disp: 20 tablet, Rfl: 0   rosuvastatin (CRESTOR) 5 MG tablet, Take 1 tablet (5 mg total) by mouth daily., Disp: 30 tablet, Rfl: 1   tirzepatide (MOUNJARO) 2.5 MG/0.5ML Pen, Inject 2.5 mg into the skin once a week., Disp: 2 mL, Rfl: 2  EXAM:  VITALS per patient if applicable:  GENERAL: alert, oriented, appears well and in no acute distress  HEENT: atraumatic, conjunttiva clear, no obvious abnormalities on inspection of external nose and ears  NECK: normal  movements of the head and neck  LUNGS: on inspection no signs of respiratory distress, breathing rate appears normal, no obvious gross SOB, gasping or wheezing  CV: no obvious cyanosis  MS: moves all visible extremities without noticeable abnormality  PSYCH/NEURO: pleasant and cooperative, no obvious depression or anxiety, speech and thought processing grossly intact  ASSESSMENT AND PLAN:  Discussed the following assessment and plan:  Morbid obesity ([place])  Irritable bowel syndrome with diarrhea  Obesity, diabetes, and hypertension syndrome ([place])  Aortic atherosclerosis ([place])  Morbid obesity ([place]) She has  Had a 21 lb weight loss since starting metformin and  following a careful low GI diet.  Due to persistent diarrhea,  Discussed change to [facility]. Reviewed the MOA and plan . rx sent to pharmacy   IBS (irritable bowel syndrome) Symptoms have been aggravated by use of metformin xr.  Medication to be d/c'd  Obesity, diabetes, and hypertension syndrome ([place]) Currently well-controlled on current medications .  hemoglobin A1c is at goal of less than 7.0  But not tolerating metformin due to recurrent diarrhea and fecal urgency.  Changing to [facility]. . Patient has no microalbuminuria. Patient is willing to initiate  statin therapy given concurrent presence of Aortic atherosclerosis . BP is too soft on metoprolol to add ARB   Aortic atherosclerosis [facility]) Reviewed findings of prior CT scan today..  Patient is willing to  Initiate statin theraapy with   5  mg crestor twice weekly  and advance as tolerated .     I discussed the assessment and treatment plan with the patient. The patient was provided an opportunity to ask questions and all were answered. The patient agreed with the plan and demonstrated an understanding of the instructions.   The patient was advised to call back or seek an in-person evaluation if the symptoms worsen or if the condition fails to improve as  anticipated.   I spent 30 minutes dedicated to the care of this patient on the date of this encounter to include pre-visit review of his medical history,  Face-to-face time with the patient , and post visit ordering of testing and therapeutics.    Crecencio Mc, MD

## 2020-12-29 ENCOUNTER — Other Ambulatory Visit: Payer: Self-pay

## 2020-12-29 ENCOUNTER — Other Ambulatory Visit: Payer: Self-pay | Admitting: Internal Medicine

## 2020-12-29 DIAGNOSIS — I700 Atherosclerosis of aorta: Secondary | ICD-10-CM | POA: Insufficient documentation

## 2020-12-29 MED ORDER — SEMAGLUTIDE(0.25 OR 0.5MG/DOS) 2 MG/1.5ML ~~LOC~~ SOPN
0.2500 mg | PEN_INJECTOR | SUBCUTANEOUS | 2 refills | Status: DC
  Filled 2020-12-29: qty 1.5, 56d supply, fill #0
  Filled 2021-03-18: qty 1.5, 56d supply, fill #1
  Filled 2021-04-29: qty 1.5, 56d supply, fill #2

## 2020-12-29 MED FILL — Metoprolol Succinate Tab ER 24HR 25 MG (Tartrate Equiv): ORAL | 30 days supply | Qty: 60 | Fill #4 | Status: AC

## 2020-12-29 NOTE — Assessment & Plan Note (Signed)
She has  Had a 21 lb weight loss since starting metformin and following a careful low GI diet.  Due to persistent diarrhea,  Discussed change to [facility]. Reviewed the MOA and plan . rx sent to pharmacy

## 2020-12-29 NOTE — Assessment & Plan Note (Signed)
Currently well-controlled on current medications .  hemoglobin A1c is at goal of less than 7.0  But not tolerating metformin due to recurrent diarrhea and fecal urgency.  Changing to [facility]. . Patient has no microalbuminuria. Patient is willing to initiate  statin therapy given concurrent presence of Aortic atherosclerosis . BP is too soft on metoprolol to add ARB

## 2020-12-29 NOTE — Assessment & Plan Note (Signed)
Symptoms have been aggravated by use of metformin xr.  Medication to be d/c'd

## 2020-12-29 NOTE — Assessment & Plan Note (Signed)
Reviewed findings of prior CT scan today..  Patient is willing to  Initiate statin theraapy with   5  mg crestor twice weekly  and advance as tolerated .

## 2020-12-30 ENCOUNTER — Telehealth: Payer: Self-pay

## 2020-12-30 ENCOUNTER — Other Ambulatory Visit: Payer: Self-pay

## 2020-12-30 NOTE — Telephone Encounter (Signed)
Pt is here to pick up her sample of Ozempic.   Medication Samples have been provided to the patient.  Drug name: Ozempic      Strength: 0.25 or 0.'5MG'$ /DOS, 2 MG/1.5ML      Qty: 1  LOT: [id number]  Exp.Date: 05/19/2023  Dosing instructions: Inject 0.25 mg into the skin once a week  The patient has been instructed regarding the correct time, dose, and frequency of taking this medication, including desired effects and most common side effects.   Jahnai Slingerland 12:25 PM 12/30/2020

## 2021-01-06 DIAGNOSIS — E1159 Type 2 diabetes mellitus with other circulatory complications: Secondary | ICD-10-CM

## 2021-01-06 DIAGNOSIS — I10 Essential (primary) hypertension: Secondary | ICD-10-CM

## 2021-01-06 DIAGNOSIS — E669 Obesity, unspecified: Secondary | ICD-10-CM

## 2021-01-06 DIAGNOSIS — E039 Hypothyroidism, unspecified: Secondary | ICD-10-CM

## 2021-01-14 LAB — HM MAMMOGRAPHY

## 2021-01-21 ENCOUNTER — Other Ambulatory Visit: Payer: Self-pay | Admitting: Internal Medicine

## 2021-01-21 MED ORDER — FOSFOMYCIN TROMETHAMINE 3 G PO PACK: 3.0000 g | PACK | Freq: Once | ORAL | 0 refills | Status: AC

## 2021-01-31 MED FILL — Metoprolol Succinate Tab ER 24HR 25 MG (Tartrate Equiv): ORAL | 30 days supply | Qty: 60 | Fill #5 | Status: AC

## 2021-02-01 ENCOUNTER — Other Ambulatory Visit: Payer: Self-pay

## 2021-02-07 ENCOUNTER — Other Ambulatory Visit: Payer: Self-pay | Admitting: Internal Medicine

## 2021-02-07 DIAGNOSIS — N3090 Cystitis, unspecified without hematuria: Secondary | ICD-10-CM

## 2021-02-07 MED ORDER — CEFDINIR 300 MG PO CAPS: 300.0000 mg | ORAL_CAPSULE | Freq: Two times a day (BID) | ORAL | 0 refills | Status: DC

## 2021-02-07 NOTE — Assessment & Plan Note (Addendum)
Symptoms did not resolve with fosfomycin.  Resolved with macrobid x 6 days but returned. Sending omnicef x 7 days

## 2021-02-17 ENCOUNTER — Other Ambulatory Visit: Payer: Self-pay | Admitting: Internal Medicine

## 2021-03-05 ENCOUNTER — Other Ambulatory Visit: Payer: Self-pay

## 2021-03-05 ENCOUNTER — Other Ambulatory Visit: Payer: Self-pay | Admitting: Internal Medicine

## 2021-03-05 MED ORDER — LEVOTHYROXINE SODIUM 25 MCG PO TABS
ORAL_TABLET | Freq: Every day | ORAL | 0 refills | Status: DC
  Filled 2021-03-05: qty 90, 90d supply, fill #0

## 2021-03-05 MED FILL — Diltiazem HCl Coated Beads Cap ER 24HR 240 MG: ORAL | 90 days supply | Qty: 90 | Fill #2 | Status: AC

## 2021-03-05 MED FILL — Cyclobenzaprine HCl Tab 10 MG: ORAL | 60 days supply | Qty: 180 | Fill #1 | Status: AC

## 2021-03-18 ENCOUNTER — Other Ambulatory Visit: Payer: Self-pay

## 2021-03-22 ENCOUNTER — Other Ambulatory Visit: Payer: Self-pay

## 2021-03-22 MED FILL — Metoprolol Succinate Tab ER 24HR 25 MG (Tartrate Equiv): ORAL | 30 days supply | Qty: 60 | Fill #6 | Status: AC

## 2021-04-29 ENCOUNTER — Other Ambulatory Visit: Payer: Self-pay

## 2021-04-29 MED FILL — Metoprolol Succinate Tab ER 24HR 25 MG (Tartrate Equiv): ORAL | 30 days supply | Qty: 60 | Fill #7 | Status: AC

## 2021-04-30 ENCOUNTER — Other Ambulatory Visit: Payer: Self-pay

## 2021-05-13 ENCOUNTER — Other Ambulatory Visit: Payer: Self-pay

## 2021-05-13 MED ORDER — METOPROLOL SUCCINATE ER 25 MG PO TB24
25.0000 mg | ORAL_TABLET | Freq: Two times a day (BID) | ORAL | 3 refills | Status: DC
  Filled 2021-05-13: qty 180, 90d supply, fill #0

## 2021-05-18 LAB — HM DIABETES EYE EXAM

## 2021-05-19 ENCOUNTER — Other Ambulatory Visit: Payer: Self-pay

## 2021-05-27 ENCOUNTER — Other Ambulatory Visit: Payer: Self-pay

## 2021-05-27 MED ORDER — CLINDAMYCIN PHOSPHATE 1 % EX SOLN
CUTANEOUS | 2 refills | Status: AC
  Filled 2021-05-27: qty 30, 30d supply, fill #0
  Filled 2022-04-17: qty 30, 30d supply, fill #1

## 2021-05-28 ENCOUNTER — Encounter: Payer: Self-pay | Admitting: Internal Medicine

## 2021-05-28 ENCOUNTER — Other Ambulatory Visit: Payer: Self-pay | Admitting: Internal Medicine

## 2021-05-28 ENCOUNTER — Other Ambulatory Visit: Payer: Self-pay

## 2021-05-28 DIAGNOSIS — N3090 Cystitis, unspecified without hematuria: Secondary | ICD-10-CM | POA: Insufficient documentation

## 2021-05-28 MED ORDER — CEFDINIR 300 MG PO CAPS: 300.0000 mg | ORAL_CAPSULE | Freq: Two times a day (BID) | ORAL | 0 refills | Status: DC

## 2021-05-28 MED ORDER — FLUCONAZOLE 150 MG PO TABS: 150.0000 mg | ORAL_TABLET | Freq: Every day | ORAL | 0 refills | Status: DC

## 2021-05-28 MED FILL — Metoprolol Succinate Tab ER 24HR 25 MG (Tartrate Equiv): ORAL | 30 days supply | Qty: 60 | Fill #8 | Status: AC

## 2021-05-28 NOTE — Assessment & Plan Note (Signed)
Empiric antibiotic therapy with omnicef sent.

## 2021-05-31 ENCOUNTER — Ambulatory Visit (INDEPENDENT_AMBULATORY_CARE_PROVIDER_SITE_OTHER): Payer: Medicare Other

## 2021-05-31 VITALS — Ht 68.0 in | Wt 273.0 lb

## 2021-05-31 DIAGNOSIS — Z0000 Encounter for general adult medical examination without abnormal findings: Secondary | ICD-10-CM

## 2021-05-31 NOTE — Progress Notes (Addendum)
Subjective:   Tiffany Wells is a 66 y.o. female who presents for Medicare Annual (Subsequent) preventive examination.  Review of Systems    No ROS.  Medicare Wellness Virtual Visit.  Visual/audio telehealth visit, UTA vital signs.   See social history for additional risk factors.   Cardiac Risk Factors include: advanced age (>73mn, >>64women);diabetes mellitus     Objective:    Today's Vitals   05/31/21 1317  Weight: 273 lb (123.8 kg)  Height: 5' 8" (1.727 m)   Body mass index is 41.51 kg/m.  Advanced Directives 05/31/2021 05/28/2020  Does Patient Have a Medical Advance Directive? Yes Yes  Type of AParamedicof AMinocquaLiving will HOmarLiving will  Does patient want to make changes to medical advance directive? No - Patient declined No - Patient declined  Copy of HPlevnain Chart? No - copy requested No - copy requested    Current Medications (verified) Outpatient Encounter Medications as of 05/31/2021  Medication Sig   cefdinir (OMNICEF) 300 MG capsule Take 1 capsule (300 mg total) by mouth 2 (two) times daily.   clindamycin (CLEOCIN T) 1 % external solution Apply to affected sites on scalp twice a day until clear, then as needed   dicyclomine (BENTYL) 10 MG capsule TAKE 1 CAPSULE BY MOUTH 3 TIMES DAILY BEFORE MEALS.   diltiazem (CARDIZEM CD) 240 MG 24 hr capsule TAKE 1 CAPSULE BY MOUTH ONCE DAILY   diltiazem (CARDIZEM) 30 MG tablet Take 30 mg by mouth daily as needed (SVT).    diphenhydrAMINE (BENADRYL) 25 MG tablet As needed for allergies.   diphenoxylate-atropine (LOMOTIL) 2.5-0.025 MG tablet TAKE ONE TABLET BY MOUTH AS NEEDED   estradiol (ESTRACE) 0.1 MG/GM vaginal cream PLACE 1/4 APPLICATORFUL VAGINALLY DAILY.   fluconazole (DIFLUCAN) 150 MG tablet Take 1 tablet (150 mg total) by mouth daily.   hyoscyamine (LEVSIN SL) 0.125 MG SL tablet Place 1 tablet (0.125 mg total) under the tongue every 4  (four) hours as needed for cramping.   levothyroxine (SYNTHROID) 25 MCG tablet TAKE 1 TABLET BY MOUTH DAILY BEFORE BREAKFAST   metoprolol succinate (TOPROL-XL) 25 MG 24 hr tablet TAKE 1 TABLET (25 MG TOTAL) BY MOUTH 2 (TWO) TIMES DAILY   metoprolol succinate (TOPROL-XL) 25 MG 24 hr tablet Take 1 tablet by mouth 2 (two) times daily   omeprazole (PRILOSEC OTC) 20 MG tablet Take 20 mg by mouth daily.   ondansetron (ZOFRAN ODT) 4 MG disintegrating tablet Take 1 tablet (4 mg total) by mouth every 8 (eight) hours as needed for nausea or vomiting.   PHENOHYTRO 16.2 MG tablet TAKE 1 TABLET BY MOUTH EVERY 8 HOURS AS NEEDED.   promethazine (PHENERGAN) 12.5 MG tablet Take 1 tablet (12.5 mg total) by mouth every 8 (eight) hours as needed for nausea or vomiting.   rosuvastatin (CRESTOR) 5 MG tablet Take 1 tablet (5 mg total) by mouth daily.   Semaglutide,0.25 or 0.5MG/DOS, 2 MG/1.5ML SOPN Inject 0.25 mg into the skin once a week.   No facility-administered encounter medications on file as of 05/31/2021.    Allergies (verified) Codeine, Codeine sulfate, Glucophage xr [metformin], Sulfa antibiotics, and Tape   History: Past Medical History:  Diagnosis Date   Acute pulmonary embolism ([place] 08/06/2013   Diagnosed during hospitalization for TKR.  Patient received heparin followed by Xarelto whic hwill continue for a total of 6 months.   [facility] has recommended IVC placement prior to next TKR and  bridging with Lovenox (June 17  [place])     Arthritis    osteoarthritis of knee   Back pain    DVT (deep venous thrombosis) ([place]) 12/28/2016   Dysrhythmia 12/28/2016   atrial fibrillation, SVT   Headache    Migraines   History of Salmonella gastroenteritis 2012   Hypertension    IBS (irritable bowel syndrome)    Internal hemorrhoids    Ischemic colitis ([place]) 02/21/2014   Normal cardiac stress test    2006, nromal stress cardiolite, EF 65%   Pulmonary embolism ([place])    post knee surgery   Past Surgical  History:  Procedure Laterality Date   COLONOSCOPY  03/07/2011   Int Hemms   COLONOSCOPY WITH PROPOFOL N/A 02/20/2017   Procedure: COLONOSCOPY WITH PROPOFOL;  Surgeon: Manya Silvas, MD;  Location: [facility] ENDOSCOPY;  Service: Endoscopy;  Laterality: N/A;   HYSTEROSCOPY     JOINT REPLACEMENT     Bilateral TKR   ROTATOR CUFF REPAIR     TUBAL LIGATION     UPPER GASTROINTESTINAL ENDOSCOPY  03/07/2011   HH Multip Gastric Polyps/ Dr Vira Agar   Family History  Problem Relation Age of Onset   Hypertension Mother    Diabetes Mother    Cancer Mother        glioma   Hypertension Father    Heart disease Father 45       CAD with CABG   Cancer Father        multiple myeloma   Arthritis Sister    Hypertension Sister    Cancer Brother        Pre-cancerous lip lesion, testicular CA    Diabetes Daughter    Cancer Maternal Grandmother        Lung Ca, nonsmoker    Social History   Socioeconomic History   Marital status: Married    Spouse name: Not on file   Number of children: 2   Years of education: Not on file   Highest education level: Not on file  Occupational History   Occupation: Glass blower/designer, [employer]  Tobacco Use   Smoking status: Never   Smokeless tobacco: Never  Vaping Use   Vaping Use: Never used  Substance and Sexual Activity   Alcohol use: Yes    Alcohol/week: 2.0 standard drinks    Types: 2 Glasses of wine per week    Comment: very rare   Drug use: No   Sexual activity: Not on file  Other Topics Concern   Not on file  Social History Narrative   Lives with husband.   Social Determinants of Health   Financial Resource Strain: Low Risk    Difficulty of Paying Living Expenses: Not hard at all  Food Insecurity: No Food Insecurity   Worried About Charity fundraiser in the Last Year: Never true   [place] in the Last Year: Never true  Transportation Needs: No Transportation Needs   Lack of Transportation (Medical): No   Lack of Transportation  (Non-Medical): No  Physical Activity: Not on file  Stress: No Stress Concern Present   Feeling of Stress : Not at all  Social Connections: Unknown   Frequency of Communication with Friends and Family: More than three times a week   Frequency of Social Gatherings with Friends and Family: More than three times a week   Attends Religious Services: Not on file   Active Member of Clubs or Organizations: Not on file   Attends  Music therapist: Not on file   Marital Status: Married    Tobacco Counseling Counseling given: Not Answered   Clinical Intake:  Pre-visit preparation completed: Yes        Diabetes:  (Followed by PCP)  How often do you need to have someone help you when you read instructions, pamphlets, or other written materials from your doctor or pharmacy?: 1 - Never  Nutrition Risk Assessment: Does the patient have any non-healing wounds?  Yes   Financial Strains and Diabetes Management: Are you having any financial strains with the device, your supplies or your medication? No .  Does the patient want to be seen by Chronic Care Management for management of their diabetes?  No  Would the patient like to be referred to a Nutritionist or for Diabetic Management?  No   Interpreter Needed?: No      Activities of Daily Living In your present state of health, do you have any difficulty performing the following activities: 05/31/2021  Hearing? N  Vision? N  Difficulty concentrating or making decisions? N  Walking or climbing stairs? N  Dressing or bathing? N  Doing errands, shopping? N  Preparing Food and eating ? N  Using the Toilet? N  In the past six months, have you accidently leaked urine? N  Do you have problems with loss of bowel control? N  Managing your Medications? N  Managing your Finances? N  Housekeeping or managing your Housekeeping? N  Some recent data might be hidden    Patient Care Team: Crecencio Mc, MD as PCP - General  (Internal Medicine)  Indicate any recent Medical Services you may have received from other than [facility] providers in the past year (date may be approximate).     Assessment:   This is a routine wellness examination for [facility].  Virtual Visit via Telephone Note  I connected with  [name] on 05/31/21 at  1:15 PM EST by telephone and verified that I am speaking with the correct person using two identifiers.  Persons participating in the virtual visit: patient/Nurse Health Advisor   I discussed the limitations, risks, security and privacy concerns of performing an evaluation and management service by telephone and the availability of in person appointments. The patient expressed understanding and agreed to proceed.  Interactive audio and video telecommunications were attempted between this nurse and patient, however failed, due to patient having technical difficulties OR patient did not have access to video capability.  We continued and completed visit with audio only.  Some vital signs may be absent or patient reported.   Hearing/Vision screen Hearing Screening - Comments:: Patient is able to hear conversational tones without difficulty.  No issues reported.  Vision Screening - Comments:: Wears corrective lenses  They have seen their ophthalmologist in the last 12 months.   Dietary issues and exercise activities discussed: Current Exercise Habits: Home exercise routine, Type of exercise: walking, Time (Minutes): 20, Frequency (Times/Week): 1, Weekly Exercise (Minutes/Week): 20, Intensity: Mild She tries to have low carb diet Fair water intake   Goals Addressed             This Visit's Progress    Follow up with Primary Care Provider       As needed Increase physical activity by walking 5x times weekly, 30 minutes       Depression Screen PHQ 2/9 Scores 05/31/2021 12/28/2020 05/28/2020 05/11/2017 04/26/2012  PHQ - 2 Score 0 0 0 0 0  PHQ- 9  Score - - - 3 -    Fall  Risk Fall Risk  05/31/2021 12/28/2020 05/28/2020 08/22/2019 05/24/2019  Falls in the past year? 0 0 0 0 0  Number falls in past yr: 0 - 0 - -  Injury with Fall? - - 0 - -  Follow up _0    FALL RISK PREVENTION PERTAINING TO THE HOME: Home free of loose throw rugs in walkways, pet beds, electrical cords, etc? Yes  Adequate lighting in your home to reduce risk of falls? Yes   ASSISTIVE DEVICES UTILIZED TO PREVENT FALLS: Life alert? No  Use of a cane, walker or w/c? No   TIMED UP AND GO: Was the test performed? No .   Cognitive Function:  Patient is alert and oriented x3.     Immunizations Immunization History  Administered Date(s) Administered   Fluad Quad(high Dose 65+) 01/31/2020   Influenza Split 01/01/2013, 01/05/2014   Influenza-Unspecified 01/17/2012, 01/18/2017, 01/21/2018, 01/24/2019   PFIZER(Purple Top)SARS-COV-2 Vaccination 05/02/2019, 05/23/2019, 04/27/2020   PNEUMOCOCCAL CONJUGATE-20 09/07/2020   Pneumococcal Polysaccharide-23 06/22/2018   Zoster Recombinat (Shingrix) 09/05/2018, 11/20/2018   Zoster, Live 10/22/2013   TDAP status: Due, Education has been provided regarding the importance of this vaccine. Advised may receive this vaccine at local pharmacy or Health Dept. Aware to provide a copy of the vaccination record if obtained from local pharmacy or Health Dept. Verbalized acceptance and understanding. Deferred.   Screening Tests Health Maintenance  Topic Date Due   COVID-19 Vaccine (4 - Booster for Pfizer series) 06/16/2021 (Originally 06/22/2020)   Hepatitis C Screening  06/16/2021 (Originally 10/22/1973)   INFLUENZA VACCINE  07/16/2021 (Originally 11/16/2020)   TETANUS/TDAP  05/31/2022 (Originally 04/26/2020)   HEMOGLOBIN A1C  06/03/2021   FOOT EXAM  09/07/2021   URINE MICROALBUMIN  09/07/2021   MAMMOGRAM  01/14/2022   OPHTHALMOLOGY EXAM   05/18/2022   COLONOSCOPY (Pts 45-12yr Insurance coverage will need to be confirmed)  02/21/2027   Pneumonia Vaccine 68 Years old  Completed   DEXA SCAN  Completed   Zoster Vaccines- Shingrix  Completed   HPV VACCINES  Aged Out   Health Maintenance There are no preventive care reminders to display for this patient.  Lung Cancer Screening: (Low Dose CT Chest recommended if Age 256-80years, 30 pack-year currently smoking OR have quit w/in 15years.) does not qualify.   Hepatitis C Screening: deferred for further discussion with pcp, per patient preference.   Vision Screening: Recommended annual ophthalmology exams for early detection of glaucoma and other disorders of the eye.  Dental Screening: Recommended annual dental exams for proper oral hygiene. Visits every 6 months.   Community Resource Referral / Chronic Care Management: CRR required this visit?  No   CCM required this visit?  No      Plan:   Keep all routine maintenance appointments.   I have personally reviewed and noted the following in the patients chart:   Medical and social history Use of alcohol, tobacco or illicit drugs  Current medications and supplements including opioid prescriptions. Not taking opioid.  Functional ability and status Nutritional status Physical activity Advanced directives List of other physicians Hospitalizations, surgeries, and ER visits in previous 12 months Vitals Screenings to include cognitive, depression, and falls Referrals and appointments  In addition, I have reviewed and discussed with patient certain preventive protocols, quality metrics, and best practice recommendations. A written personalized care plan for preventive services  as well as general preventive health recommendations were provided to patient via mychart.     OBrien-Blaney, Denisa L, LPN   [id number]/85/9292     I have reviewed the above information and agree with above.   Deborra Medina, MD

## 2021-05-31 NOTE — Patient Instructions (Addendum)
Tiffany Wells , Thank you for taking time to come for your Medicare Wellness Visit. I appreciate your ongoing commitment to your health goals. Please review the following plan we discussed and let me know if I can assist you in the future.   These are the goals we discussed:  Goals      Follow up with Primary Care Provider     As needed Increase physical activity by walking 5x times weekly, 30 minutes        This is a list of the screening recommended for you and due dates:  Health Maintenance  Topic Date Due   COVID-19 Vaccine (4 - Booster for Pfizer series) 06/16/2021*   Hepatitis C Screening: USPSTF Recommendation to screen - Ages 18-79 yo.  06/16/2021*   Flu Shot  07/16/2021*   Tetanus Vaccine  05/31/2022*   Hemoglobin A1C  06/03/2021   Complete foot exam   09/07/2021   Urine Protein Check  09/07/2021   Mammogram  01/14/2022   Eye exam for diabetics  05/18/2022   Colon Cancer Screening  02/21/2027   Pneumonia Vaccine  Completed   DEXA scan (bone density measurement)  Completed   Zoster (Shingles) Vaccine  Completed   HPV Vaccine  Aged Out  *Topic was postponed. The date shown is not the original due date.    Advanced directives: End of life planning; Advance aging; Advanced directives discussed.  Copy of current HCPOA/Living Will requested.    Conditions/risks identified: none new  Follow up in one year for your annual wellness visit    Preventive Care 68 Years and Older, Female Preventive care refers to lifestyle choices and visits with your health care provider that can promote health and wellness. What does preventive care include? A yearly physical exam. This is also called an annual well check. Dental exams once or twice a year. Routine eye exams. Ask your health care provider how often you should have your eyes checked. Personal lifestyle choices, including: Daily care of your teeth and gums. Regular physical activity. Eating a healthy diet. Avoiding tobacco  and drug use. Limiting alcohol use. Practicing safe sex. Taking low-dose aspirin every day. Taking vitamin and mineral supplements as recommended by your health care provider. What happens during an annual well check? The services and screenings done by your health care provider during your annual well check will depend on your age, overall health, lifestyle risk factors, and family history of disease. Counseling  Your health care provider may ask you questions about your: Alcohol use. Tobacco use. Drug use. Emotional well-being. Home and relationship well-being. Sexual activity. Eating habits. History of falls. Memory and ability to understand (cognition). Work and work Statistician. Reproductive health. Screening  You may have the following tests or measurements: Height, weight, and BMI. Blood pressure. Lipid and cholesterol levels. These may be checked every 5 years, or more frequently if you are over 41 years old. Skin check. Lung cancer screening. You may have this screening every year starting at age 68 if you have a 30-pack-year history of smoking and currently smoke or have quit within the past 15 years. Fecal occult blood test (FOBT) of the stool. You may have this test every year starting at age 68. Flexible sigmoidoscopy or colonoscopy. You may have a sigmoidoscopy every 5 years or a colonoscopy every 10 years starting at age 68. Hepatitis C blood test. Hepatitis B blood test. Sexually transmitted disease (STD) testing. Diabetes screening. This is done by checking your blood sugar (glucose)  after you have not eaten for a while (fasting). You may have this done every 1-3 years. Bone density scan. This is done to screen for osteoporosis. You may have this done starting at age 68. Mammogram. This may be done every 1-2 years. Talk to your health care provider about how often you should have regular mammograms. Talk with your health care provider about your test results,  treatment options, and if necessary, the need for more tests. Vaccines  Your health care provider may recommend certain vaccines, such as: Influenza vaccine. This is recommended every year. Tetanus, diphtheria, and acellular pertussis (Tdap, Td) vaccine. You may need a Td booster every 10 years. Zoster vaccine. You may need this after age 68. Pneumococcal 13-valent conjugate (PCV13) vaccine. One dose is recommended after age 68. Pneumococcal polysaccharide (PPSV23) vaccine. One dose is recommended after age 68. Talk to your health care provider about which screenings and vaccines you need and how often you need them. This information is not intended to replace advice given to you by your health care provider. Make sure you discuss any questions you have with your health care provider. Document Released: 05/01/2015 Document Revised: 12/23/2015 Document Reviewed: 02/03/2015 Elsevier Interactive Patient Education  2017 [place] Prevention in the Home Falls can cause injuries. They can happen to people of all ages. There are many things you can do to make your home safe and to help prevent falls. What can I do on the outside of my home? Regularly fix the edges of walkways and driveways and fix any cracks. Remove anything that might make you trip as you walk through a door, such as a raised step or threshold. Trim any bushes or trees on the path to your home. Use bright outdoor lighting. Clear any walking paths of anything that might make someone trip, such as rocks or tools. Regularly check to see if handrails are loose or broken. Make sure that both sides of any steps have handrails. Any raised decks and porches should have guardrails on the edges. Have any leaves, snow, or ice cleared regularly. Use sand or salt on walking paths during winter. Clean up any spills in your garage right away. This includes oil or grease spills. What can I do in the bathroom? Use night  lights. Install grab bars by the toilet and in the tub and shower. Do not use towel bars as grab bars. Use non-skid mats or decals in the tub or shower. If you need to sit down in the shower, use a plastic, non-slip stool. Keep the floor dry. Clean up any water that spills on the floor as soon as it happens. Remove soap buildup in the tub or shower regularly. Attach bath mats securely with double-sided non-slip rug tape. Do not have throw rugs and other things on the floor that can make you trip. What can I do in the bedroom? Use night lights. Make sure that you have a light by your bed that is easy to reach. Do not use any sheets or blankets that are too big for your bed. They should not hang down onto the floor. Have a firm chair that has side arms. You can use this for support while you get dressed. Do not have throw rugs and other things on the floor that can make you trip. What can I do in the kitchen? Clean up any spills right away. Avoid walking on wet floors. Keep items that you use a lot in easy-to-reach places. If  you need to reach something above you, use a strong step stool that has a grab bar. Keep electrical cords out of the way. Do not use floor polish or wax that makes floors slippery. If you must use wax, use non-skid floor wax. Do not have throw rugs and other things on the floor that can make you trip. What can I do with my stairs? Do not leave any items on the stairs. Make sure that there are handrails on both sides of the stairs and use them. Fix handrails that are broken or loose. Make sure that handrails are as long as the stairways. Check any carpeting to make sure that it is firmly attached to the stairs. Fix any carpet that is loose or worn. Avoid having throw rugs at the top or bottom of the stairs. If you do have throw rugs, attach them to the floor with carpet tape. Make sure that you have a light switch at the top of the stairs and the bottom of the stairs. If  you do not have them, ask someone to add them for you. What else can I do to help prevent falls? Wear shoes that: Do not have high heels. Have rubber bottoms. Are comfortable and fit you well. Are closed at the toe. Do not wear sandals. If you use a stepladder: Make sure that it is fully opened. Do not climb a closed stepladder. Make sure that both sides of the stepladder are locked into place. Ask someone to hold it for you, if possible. Clearly mark and make sure that you can see: Any grab bars or handrails. First and last steps. Where the edge of each step is. Use tools that help you move around (mobility aids) if they are needed. These include: Canes. Walkers. Scooters. Crutches. Turn on the lights when you go into a dark area. Replace any light bulbs as soon as they burn out. Set up your furniture so you have a clear path. Avoid moving your furniture around. If any of your floors are uneven, fix them. If there are any pets around you, be aware of where they are. Review your medicines with your doctor. Some medicines can make you feel dizzy. This can increase your chance of falling. Ask your doctor what other things that you can do to help prevent falls. This information is not intended to replace advice given to you by your health care provider. Make sure you discuss any questions you have with your health care provider. Document Released: 01/29/2009 Document Revised: 09/10/2015 Document Reviewed: 05/09/2014 Elsevier Interactive Patient Education  2017 [employer].

## 2021-06-04 ENCOUNTER — Other Ambulatory Visit: Payer: Self-pay | Admitting: Internal Medicine

## 2021-06-04 ENCOUNTER — Other Ambulatory Visit: Payer: Self-pay

## 2021-06-04 MED ORDER — DILTIAZEM HCL ER COATED BEADS 240 MG PO CP24
240.0000 mg | ORAL_CAPSULE | Freq: Every day | ORAL | 3 refills | Status: DC
  Filled 2021-06-04: qty 90, 90d supply, fill #0
  Filled 2021-07-26 – 2021-09-06 (×3): qty 90, 90d supply, fill #1
  Filled 2021-11-29: qty 90, 90d supply, fill #2

## 2021-06-04 MED ORDER — LEVOTHYROXINE SODIUM 25 MCG PO TABS
ORAL_TABLET | Freq: Every day | ORAL | 0 refills | Status: DC
  Filled 2021-06-04: qty 90, 90d supply, fill #0

## 2021-06-20 ENCOUNTER — Other Ambulatory Visit: Payer: Self-pay | Admitting: Internal Medicine

## 2021-06-21 ENCOUNTER — Other Ambulatory Visit: Payer: Self-pay | Admitting: Internal Medicine

## 2021-06-21 ENCOUNTER — Other Ambulatory Visit: Payer: Self-pay

## 2021-06-21 MED ORDER — SEMAGLUTIDE(0.25 OR 0.5MG/DOS) 2 MG/1.5ML ~~LOC~~ SOPN
0.2500 mg | PEN_INJECTOR | SUBCUTANEOUS | 2 refills | Status: DC
  Filled 2021-06-21: qty 1.5, 56d supply, fill #0
  Filled 2021-07-30: qty 1.5, 56d supply, fill #1

## 2021-06-21 NOTE — Addendum Note (Signed)
Addended by: Crecencio Mc on: 06/21/2021 12:43 PM ? ? Modules accepted: Orders ? ?

## 2021-06-22 ENCOUNTER — Other Ambulatory Visit: Payer: Self-pay | Admitting: Internal Medicine

## 2021-06-22 ENCOUNTER — Other Ambulatory Visit: Payer: Self-pay

## 2021-06-22 ENCOUNTER — Other Ambulatory Visit (INDEPENDENT_AMBULATORY_CARE_PROVIDER_SITE_OTHER): Payer: Medicare Other

## 2021-06-22 DIAGNOSIS — E039 Hypothyroidism, unspecified: Secondary | ICD-10-CM | POA: Diagnosis not present

## 2021-06-22 DIAGNOSIS — E669 Obesity, unspecified: Secondary | ICD-10-CM

## 2021-06-22 DIAGNOSIS — E1159 Type 2 diabetes mellitus with other circulatory complications: Secondary | ICD-10-CM | POA: Diagnosis not present

## 2021-06-22 DIAGNOSIS — I152 Hypertension secondary to endocrine disorders: Secondary | ICD-10-CM

## 2021-06-22 DIAGNOSIS — E1169 Type 2 diabetes mellitus with other specified complication: Secondary | ICD-10-CM

## 2021-06-22 DIAGNOSIS — N3090 Cystitis, unspecified without hematuria: Secondary | ICD-10-CM | POA: Diagnosis not present

## 2021-06-22 LAB — URINALYSIS, ROUTINE W REFLEX MICROSCOPIC
Bilirubin Urine: NEGATIVE
Hgb urine dipstick: NEGATIVE
Ketones, ur: NEGATIVE
Nitrite: NEGATIVE
RBC / HPF: NONE SEEN (ref 0–?)
Specific Gravity, Urine: 1.01 (ref 1.000–1.030)
Total Protein, Urine: NEGATIVE
Urine Glucose: NEGATIVE
Urobilinogen, UA: 0.2 (ref 0.0–1.0)
pH: 7 (ref 5.0–8.0)

## 2021-06-22 LAB — COMPREHENSIVE METABOLIC PANEL
ALT: 15 U/L (ref 0–35)
AST: 17 U/L (ref 0–37)
Albumin: 3.7 g/dL (ref 3.5–5.2)
Alkaline Phosphatase: 74 U/L (ref 39–117)
BUN: 8 mg/dL (ref 6–23)
CO2: 32 mEq/L (ref 19–32)
Calcium: 8.7 mg/dL (ref 8.4–10.5)
Chloride: 105 mEq/L (ref 96–112)
Creatinine, Ser: 0.7 mg/dL (ref 0.40–1.20)
GFR: 89.34 mL/min (ref >60.00)
Glucose, Bld: 120 mg/dL — ABNORMAL HIGH (ref 70–99)
Potassium: 4.5 mEq/L (ref 3.5–5.1)
Sodium: 143 mEq/L (ref 135–145)
Total Bilirubin: 0.4 mg/dL (ref 0.2–1.2)
Total Protein: 5.9 g/dL — ABNORMAL LOW (ref 6.0–8.3)

## 2021-06-22 LAB — LIPID PANEL
Cholesterol: 210 mg/dL — ABNORMAL HIGH (ref 0–200)
HDL: 43.2 mg/dL (ref >39.00)
LDL Cholesterol: 143 mg/dL — ABNORMAL HIGH (ref 0–99)
NonHDL: 166.7
Total CHOL/HDL Ratio: 5
Triglycerides: 117 mg/dL (ref 0.0–149.0)
VLDL: 23.4 mg/dL (ref 0.0–40.0)

## 2021-06-22 LAB — HEMOGLOBIN A1C: Hgb A1c MFr Bld: 6.1 % (ref 4.6–6.5)

## 2021-06-22 LAB — TSH: TSH: 2.7 u[IU]/mL (ref 0.35–5.50)

## 2021-06-22 MED ORDER — LEVOFLOXACIN 500 MG PO TABS
500.0000 mg | ORAL_TABLET | Freq: Every day | ORAL | 0 refills | Status: AC
  Filled 2021-06-22: qty 10, 10d supply, fill #0

## 2021-06-22 MED ORDER — DOXYCYCLINE HYCLATE 100 MG PO TABS
100.0000 mg | ORAL_TABLET | Freq: Two times a day (BID) | ORAL | 0 refills | Status: DC
  Filled 2021-06-22: qty 20, 10d supply, fill #0

## 2021-06-23 LAB — URINE CULTURE
MICRO NUMBER:: 13098216
Result:: NO GROWTH
SPECIMEN QUALITY:: ADEQUATE

## 2021-06-28 ENCOUNTER — Encounter: Payer: Self-pay | Admitting: Internal Medicine

## 2021-06-28 ENCOUNTER — Other Ambulatory Visit: Payer: Self-pay

## 2021-06-28 ENCOUNTER — Ambulatory Visit (INDEPENDENT_AMBULATORY_CARE_PROVIDER_SITE_OTHER): Payer: Medicare Other | Admitting: Internal Medicine

## 2021-06-28 VITALS — BP 132/80 | HR 89 | Temp 97.7°F | Ht 68.0 in | Wt 278.0 lb

## 2021-06-28 DIAGNOSIS — I10 Essential (primary) hypertension: Secondary | ICD-10-CM | POA: Diagnosis not present

## 2021-06-28 DIAGNOSIS — E1169 Type 2 diabetes mellitus with other specified complication: Secondary | ICD-10-CM | POA: Diagnosis not present

## 2021-06-28 DIAGNOSIS — I152 Hypertension secondary to endocrine disorders: Secondary | ICD-10-CM

## 2021-06-28 DIAGNOSIS — I700 Atherosclerosis of aorta: Secondary | ICD-10-CM

## 2021-06-28 DIAGNOSIS — E039 Hypothyroidism, unspecified: Secondary | ICD-10-CM | POA: Diagnosis not present

## 2021-06-28 DIAGNOSIS — I471 Supraventricular tachycardia: Secondary | ICD-10-CM

## 2021-06-28 DIAGNOSIS — E669 Obesity, unspecified: Secondary | ICD-10-CM

## 2021-06-28 DIAGNOSIS — E1159 Type 2 diabetes mellitus with other circulatory complications: Secondary | ICD-10-CM

## 2021-06-28 NOTE — Assessment & Plan Note (Signed)
She has  Had a 21 lb weight loss since starting metformin and following a careful low GI diet but had persistent  diarrhea,  And was changed to ozempic , which has been tolerated and effective in producing weight loss.  increasing dose to 0.5 mg  ?

## 2021-06-28 NOTE — Assessment & Plan Note (Addendum)
Reviewed findings of prior CT scan today..  Patient is willing to  Initiate statin therapy with 5 mg crestor  ?

## 2021-06-28 NOTE — Assessment & Plan Note (Signed)
Well controlled on current regimen of diltiazem and metoprolol reduced dose. Renal function stable, no changes today. ? ?Lab Results  ?Component Value Date  ? CREATININE 0.70 06/22/2021  ? ?Lab Results  ?Component Value Date  ? NA 143 06/22/2021  ? K 4.5 06/22/2021  ? CL 105 06/22/2021  ? CO2 32 06/22/2021  ? ? ?

## 2021-06-28 NOTE — Assessment & Plan Note (Signed)
She has had fewer episodes since the  medication change from metformin to ozempic  ?

## 2021-06-28 NOTE — Patient Instructions (Addendum)
Good to see you!   ? ?Increase ozempic to 0.5 mg weekly if tolerated ? ? ?Start the crestor .  Return for liver enzymes after 3 weeks of therapy (no fasting required) ? ? ?We'll repeat a full panel (fasting) September  ?

## 2021-06-28 NOTE — Progress Notes (Signed)
Subjective:  Patient ID: Tiffany Wells, female    DOB: [birth year]  Age: 66 y.o. MRN: [id number]  CC: The primary encounter diagnosis was Acquired hypothyroidism. Diagnoses of Obesity, diabetes, and hypertension syndrome ([place]), Essential hypertension, Morbid obesity ([place]), SVT (supraventricular tachycardia) ([place]), and Aortic atherosclerosis ([place]) were also pertinent to this visit.   This visit occurred during the SARS-CoV-2 public health emergency.  Safety protocols were in place, including screening questions prior to the visit, additional usage of staff PPE, and extensive cleaning of exam room while observing appropriate contact time as indicated for disinfecting solutions.    HPI Tiffany Wells presents for  Chief Complaint  Patient presents with   Follow-up    Follow up on diabetes    1) Type 2 DM:   She  feels generally well,  But is not  exercising regularly  due to orthopedic issues  .  She is trying to lose weight and has lost 16 lbs compared to May 2022.  Checking  blood sugars less than once daily at variable times, usually only if she feels she may be having a hypoglycemic event. .  BS have been under 130 fasting and < 150 post prandially.  Denies any recent hypoglyemic events.  Taking   ozempic as  directed. Following a carbohydrate modified diet 6 days per week. Denies numbness, burning and tingling of extremities. Appetite is diminished with use of of Ozempic . Has not started Crestor yet.   Outpatient Medications Prior to Visit  Medication Sig Dispense Refill   clindamycin (CLEOCIN T) 1 % external solution Apply to affected sites on scalp twice a day until clear, then as needed 30 mL 2   dicyclomine (BENTYL) 10 MG capsule TAKE 1 CAPSULE BY MOUTH 3 TIMES DAILY BEFORE MEALS. 120 capsule 3   diltiazem (CARDIZEM CD) 240 MG 24 hr capsule TAKE 1 CAPSULE BY MOUTH ONCE DAILY 90 capsule 3   diltiazem (CARDIZEM) 30 MG tablet Take 30 mg by mouth daily as needed (SVT).       diphenhydrAMINE (BENADRYL) 25 MG tablet As needed for allergies.     diphenoxylate-atropine (LOMOTIL) 2.5-0.025 MG tablet TAKE ONE TABLET BY MOUTH AS NEEDED 30 tablet 2   estradiol (ESTRACE) 0.1 MG/GM vaginal cream PLACE 1/4 APPLICATORFUL VAGINALLY DAILY. 42.5 g 9   hyoscyamine (LEVSIN SL) 0.125 MG SL tablet Place 1 tablet (0.125 mg total) under the tongue every 4 (four) hours as needed for cramping. 60 tablet 5   levothyroxine (SYNTHROID) 25 MCG tablet TAKE 1 TABLET BY MOUTH DAILY BEFORE BREAKFAST 90 tablet 0   metoprolol succinate (TOPROL-XL) 25 MG 24 hr tablet TAKE 1 TABLET (25 MG TOTAL) BY MOUTH 2 (TWO) TIMES DAILY 60 tablet 11   omeprazole (PRILOSEC OTC) 20 MG tablet Take 20 mg by mouth daily.     ondansetron (ZOFRAN ODT) 4 MG disintegrating tablet Take 1 tablet (4 mg total) by mouth every 8 (eight) hours as needed for nausea or vomiting. 20 tablet 0   PHENOHYTRO 16.2 MG tablet TAKE 1 TABLET BY MOUTH EVERY 8 HOURS AS NEEDED. 120 tablet 0   promethazine (PHENERGAN) 12.5 MG tablet Take 1 tablet (12.5 mg total) by mouth every 8 (eight) hours as needed for nausea or vomiting. 20 tablet 0   rosuvastatin (CRESTOR) 5 MG tablet Take 1 tablet (5 mg total) by mouth daily. 30 tablet 1   Semaglutide,0.25 or 0.'5MG'$ /DOS, 2 MG/1.5ML SOPN Inject 0.25 mg into the skin once a week. 1.5 mL 2  metoprolol succinate (TOPROL-XL) 25 MG 24 hr tablet Take 1 tablet by mouth 2 (two) times daily 180 tablet 3   doxycycline (VIBRA-TABS) 100 MG tablet Take 1 tablet (100 mg total) by mouth 2 (two) times daily. (Patient not taking: Reported on 06/28/2021) 20 tablet 0   fluconazole (DIFLUCAN) 150 MG tablet Take 1 tablet (150 mg total) by mouth daily. (Patient not taking: Reported on 06/28/2021) 2 tablet 0   levofloxacin (LEVAQUIN) 500 MG tablet Take 1 tablet (500 mg total) by mouth daily for 10 days. (Patient not taking: Reported on 06/28/2021) 10 tablet 0   diltiazem (CARDIZEM CD) 240 MG 24 hr capsule TAKE 1 CAPSULE BY MOUTH ONCE  DAILY 90 capsule 3   No facility-administered medications prior to visit.    Review of Systems;  Patient denies headache, fevers, malaise, unintentional weight loss, skin rash, eye pain, sinus congestion and sinus pain, sore throat, dysphagia,  hemoptysis , cough, dyspnea, wheezing, chest pain, palpitations, orthopnea, edema, abdominal pain, nausea, melena, diarrhea, constipation, flank pain, dysuria, hematuria, urinary  Frequency, nocturia, numbness, tingling, seizures,  Focal weakness, Loss of consciousness,  Tremor, insomnia, depression, anxiety, and suicidal ideation.      Objective:  BP 132/80 (BP Location: Left Arm, Patient Position: Sitting, Cuff Size: Large)    Pulse 89    Temp 97.7 F (36.5 C) (Oral)    Ht '5\' 8"'$  (1.727 m)    Wt 278 lb (126.1 kg)    SpO2 99%    BMI 42.27 kg/m   BP Readings from Last 3 Encounters:  06/28/21 132/80  12/28/20 95/61  09/07/20 108/68    Wt Readings from Last 3 Encounters:  06/28/21 278 lb (126.1 kg)  05/31/21 273 lb (123.8 kg)  12/28/20 273 lb (123.8 kg)    General appearance: alert, cooperative and appears stated age Ears: normal TM's and external ear canals both ears Throat: lips, mucosa, and tongue normal; teeth and gums normal Neck: no adenopathy, no carotid bruit, supple, symmetrical, trachea midline and thyroid not enlarged, symmetric, no tenderness/mass/nodules Back: symmetric, no curvature. ROM normal. No CVA tenderness. Lungs: clear to auscultation bilaterally Heart: regular rate and rhythm, S1, S2 normal, no murmur, click, rub or gallop Abdomen: soft, non-tender; bowel sounds normal; no masses,  no organomegaly Pulses: 2+ and symmetric Skin: Skin color, texture, turgor normal. No rashes or lesions Lymph nodes: Cervical, supraclavicular, and axillary nodes normal.  Lab Results  Component Value Date   HGBA1C 6.1 06/22/2021   HGBA1C 6.0 12/01/2020   HGBA1C 8.0 08/21/2020    Lab Results  Component Value Date   CREATININE 0.70  06/22/2021   CREATININE 0.89 12/01/2020   CREATININE 0.84 08/22/2019    Lab Results  Component Value Date   WBC 8.5 08/22/2019   HGB 13.5 08/22/2019   HCT 40.5 08/22/2019   PLT 231.0 08/22/2019   GLUCOSE 120 (H) 06/22/2021   CHOL 210 (H) 06/22/2021   TRIG 117.0 06/22/2021   HDL 43.20 06/22/2021   LDLDIRECT 146 (H) 05/24/2019   LDLCALC 143 (H) 06/22/2021   ALT 15 06/22/2021   AST 17 06/22/2021   NA 143 06/22/2021   K 4.5 06/22/2021   CL 105 06/22/2021   CREATININE 0.70 06/22/2021   BUN 8 06/22/2021   CO2 32 06/22/2021   TSH 2.70 06/22/2021   INR 1.1 02/13/2014   HGBA1C 6.1 06/22/2021   MICROALBUR 1.7 09/07/2020    DG Chest 2 View  Result Date: 11/05/2020 CLINICAL DATA:  Fever, nonproductive cough, headache, chills  and body aches for 3 days; COVID-19 negative x 2 EXAM: CHEST - 2 VIEW COMPARISON:  05/02/2017 FINDINGS: Normal heart size, mediastinal contours, and pulmonary vascularity. Lungs clear. No pleural effusion or pneumothorax. Bones unremarkable. IMPRESSION: Normal exam. Electronically Signed   By: Lavonia Dana M.D.   On: 11/05/2020 10:39    Assessment & Plan:   Problem List Items Addressed This Visit     Essential hypertension    Well controlled on current regimen of diltiazem and metoprolol reduced dose. Renal function stable, no changes today.  Lab Results  Component Value Date   CREATININE 0.70 06/22/2021   Lab Results  Component Value Date   NA 143 06/22/2021   K 4.5 06/22/2021   CL 105 06/22/2021   CO2 32 06/22/2021         Morbid obesity ([place])    She has  Had a 21 lb weight loss since starting metformin and following a careful low GI diet but had persistent  diarrhea,  And was changed to ozempic , which has been tolerated and effective in producing weight loss.  increasing dose to 0.5 mg       SVT (supraventricular tachycardia) ([facility])    She has had fewer episodes since the  medication change from metformin to ozempic       Obesity, diabetes,  and hypertension syndrome ([facility])   Relevant Orders   Hemoglobin A1c   Comprehensive metabolic panel   Lipid panel   Microalbumin / creatinine urine ratio   Acquired hypothyroidism - Primary   Relevant Orders   TSH   Aortic atherosclerosis ([place])    Reviewed findings of prior CT scan today..  Patient is willing to  Initiate statin therapy with 5 mg crestor        I spent 30 minutes dedicated to the care of this patient on the date of this encounter to include pre-visit review of patient's medical history,  most recent imaging studies, Face-to-face time with the patient , and post visit ordering of testing and therapeutics.    Follow-up: No follow-ups on file.   Crecencio Mc, MD

## 2021-06-29 ENCOUNTER — Other Ambulatory Visit: Payer: Self-pay

## 2021-06-29 MED FILL — Metoprolol Succinate Tab ER 24HR 25 MG (Tartrate Equiv): ORAL | 30 days supply | Qty: 60 | Fill #9 | Status: AC

## 2021-07-19 ENCOUNTER — Other Ambulatory Visit: Payer: Medicare Other

## 2021-07-22 ENCOUNTER — Other Ambulatory Visit (INDEPENDENT_AMBULATORY_CARE_PROVIDER_SITE_OTHER): Payer: Medicare Other

## 2021-07-22 DIAGNOSIS — E039 Hypothyroidism, unspecified: Secondary | ICD-10-CM | POA: Diagnosis not present

## 2021-07-22 DIAGNOSIS — E1169 Type 2 diabetes mellitus with other specified complication: Secondary | ICD-10-CM

## 2021-07-22 DIAGNOSIS — E1159 Type 2 diabetes mellitus with other circulatory complications: Secondary | ICD-10-CM | POA: Diagnosis not present

## 2021-07-22 DIAGNOSIS — E669 Obesity, unspecified: Secondary | ICD-10-CM

## 2021-07-22 DIAGNOSIS — I152 Hypertension secondary to endocrine disorders: Secondary | ICD-10-CM

## 2021-07-22 LAB — MICROALBUMIN / CREATININE URINE RATIO
Creatinine,U: 108.7 mg/dL
Microalb Creat Ratio: 0.6 mg/g (ref 0.0–30.0)
Microalb, Ur: <0.7 mg/dL (ref 0.0–1.9)

## 2021-07-22 LAB — COMPREHENSIVE METABOLIC PANEL
ALT: 10 U/L (ref 0–35)
AST: 15 U/L (ref 0–37)
Albumin: 3.8 g/dL (ref 3.5–5.2)
Alkaline Phosphatase: 75 U/L (ref 39–117)
BUN: 14 mg/dL (ref 6–23)
CO2: 29 mEq/L (ref 19–32)
Calcium: 8.8 mg/dL (ref 8.4–10.5)
Chloride: 104 mEq/L (ref 96–112)
Creatinine, Ser: 0.88 mg/dL (ref 0.40–1.20)
GFR: 67.84 mL/min (ref >60.00)
Glucose, Bld: 142 mg/dL — ABNORMAL HIGH (ref 70–99)
Potassium: 4.1 mEq/L (ref 3.5–5.1)
Sodium: 140 mEq/L (ref 135–145)
Total Bilirubin: 0.4 mg/dL (ref 0.2–1.2)
Total Protein: 6.3 g/dL (ref 6.0–8.3)

## 2021-07-22 LAB — LIPID PANEL
Cholesterol: 155 mg/dL (ref 0–200)
HDL: 47.3 mg/dL (ref >39.00)
LDL Cholesterol: 86 mg/dL (ref 0–99)
NonHDL: 107.34
Total CHOL/HDL Ratio: 3
Triglycerides: 107 mg/dL (ref 0.0–149.0)
VLDL: 21.4 mg/dL (ref 0.0–40.0)

## 2021-07-22 LAB — HEMOGLOBIN A1C: Hgb A1c MFr Bld: 6 % (ref 4.6–6.5)

## 2021-07-22 LAB — TSH: TSH: 4.04 u[IU]/mL (ref 0.35–5.50)

## 2021-07-25 ENCOUNTER — Other Ambulatory Visit: Payer: Self-pay | Admitting: Internal Medicine

## 2021-07-25 ENCOUNTER — Other Ambulatory Visit: Payer: Self-pay

## 2021-07-26 ENCOUNTER — Other Ambulatory Visit: Payer: Self-pay | Admitting: Internal Medicine

## 2021-07-26 ENCOUNTER — Other Ambulatory Visit: Payer: Self-pay

## 2021-07-26 MED ORDER — CYCLOBENZAPRINE HCL 10 MG PO TABS
ORAL_TABLET | ORAL | 2 refills | Status: DC
  Filled 2021-07-26: qty 180, 60d supply, fill #0
  Filled 2022-02-19: qty 180, 60d supply, fill #1
  Filled 2022-04-17: qty 180, 60d supply, fill #2

## 2021-07-27 ENCOUNTER — Other Ambulatory Visit: Payer: Self-pay

## 2021-07-27 ENCOUNTER — Other Ambulatory Visit: Payer: Self-pay | Admitting: Internal Medicine

## 2021-07-27 MED ORDER — METOPROLOL SUCCINATE ER 25 MG PO TB24
25.0000 mg | ORAL_TABLET | Freq: Two times a day (BID) | ORAL | 11 refills | Status: DC
  Filled 2021-07-27: qty 60, 30d supply, fill #0
  Filled 2021-08-26: qty 60, 30d supply, fill #1
  Filled 2021-10-26: qty 60, 30d supply, fill #2
  Filled 2021-11-21: qty 60, 30d supply, fill #3
  Filled 2021-12-25: qty 60, 30d supply, fill #4
  Filled 2022-01-25: qty 60, 30d supply, fill #5
  Filled 2022-02-19: qty 60, 30d supply, fill #6
  Filled 2022-03-22: qty 60, 30d supply, fill #7
  Filled 2022-04-17: qty 60, 30d supply, fill #8

## 2021-07-28 ENCOUNTER — Other Ambulatory Visit: Payer: Self-pay

## 2021-07-28 MED ORDER — DILTIAZEM HCL 30 MG PO TABS
30.0000 mg | ORAL_TABLET | Freq: Every day | ORAL | 3 refills | Status: DC | PRN
  Filled 2021-07-28: qty 30, 30d supply, fill #0
  Filled 2022-02-19: qty 30, 30d supply, fill #1

## 2021-07-28 MED FILL — Levothyroxine Sodium Tab 25 MCG: ORAL | 90 days supply | Qty: 90 | Fill #0 | Status: CN

## 2021-07-30 ENCOUNTER — Other Ambulatory Visit: Payer: Self-pay

## 2021-07-30 ENCOUNTER — Encounter: Payer: Self-pay | Admitting: Internal Medicine

## 2021-07-30 MED ORDER — SEMAGLUTIDE(0.25 OR 0.5MG/DOS) 2 MG/1.5ML ~~LOC~~ SOPN: 0.5000 mg | PEN_INJECTOR | SUBCUTANEOUS | 2 refills | Status: DC

## 2021-08-05 ENCOUNTER — Other Ambulatory Visit: Payer: Self-pay | Admitting: Internal Medicine

## 2021-08-05 DIAGNOSIS — N3090 Cystitis, unspecified without hematuria: Secondary | ICD-10-CM

## 2021-08-05 MED ORDER — HYOSCYAMINE SULFATE 0.125 MG SL SUBL: 0.1250 mg | SUBLINGUAL_TABLET | SUBLINGUAL | 5 refills | Status: DC | PRN

## 2021-08-05 MED ORDER — CEFDINIR 300 MG PO CAPS: 300.0000 mg | ORAL_CAPSULE | Freq: Two times a day (BID) | ORAL | 0 refills | Status: DC

## 2021-08-05 NOTE — Assessment & Plan Note (Signed)
Suggested by dysuria and Dipstick positive for leukocytes. Empiric cefdinir sent to [facility]  ?

## 2021-08-09 ENCOUNTER — Telehealth: Payer: Self-pay

## 2021-08-09 NOTE — Telephone Encounter (Signed)
PA for Dicyclomine has been submitted on covermymeds.  ?

## 2021-08-10 ENCOUNTER — Other Ambulatory Visit: Payer: Self-pay

## 2021-08-10 ENCOUNTER — Other Ambulatory Visit: Payer: Self-pay | Admitting: Internal Medicine

## 2021-08-10 MED FILL — Dicyclomine HCl Cap 10 MG: ORAL | 40 days supply | Qty: 120 | Fill #0 | Status: AC

## 2021-08-23 ENCOUNTER — Other Ambulatory Visit: Payer: Self-pay

## 2021-08-23 MED ORDER — ROSUVASTATIN CALCIUM 5 MG PO TABS
5.0000 mg | ORAL_TABLET | Freq: Every day | ORAL | 1 refills | Status: DC
  Filled 2021-08-23: qty 90, 90d supply, fill #0
  Filled 2021-11-21: qty 90, 90d supply, fill #1

## 2021-08-23 NOTE — Telephone Encounter (Signed)
PA has been approved through 08/10/2022. ?

## 2021-08-26 ENCOUNTER — Other Ambulatory Visit: Payer: Self-pay

## 2021-08-26 MED FILL — Levothyroxine Sodium Tab 25 MCG: ORAL | 90 days supply | Qty: 90 | Fill #0 | Status: CN

## 2021-08-27 ENCOUNTER — Other Ambulatory Visit: Payer: Self-pay

## 2021-08-30 ENCOUNTER — Other Ambulatory Visit: Payer: Self-pay

## 2021-08-30 MED FILL — Levothyroxine Sodium Tab 25 MCG: ORAL | 90 days supply | Qty: 90 | Fill #0 | Status: AC

## 2021-09-06 ENCOUNTER — Other Ambulatory Visit: Payer: Self-pay

## 2021-09-07 ENCOUNTER — Other Ambulatory Visit: Payer: Self-pay

## 2021-09-07 ENCOUNTER — Other Ambulatory Visit: Payer: Self-pay | Admitting: Internal Medicine

## 2021-09-07 MED FILL — Fluconazole Tab 150 MG: ORAL | 2 days supply | Qty: 2 | Fill #0 | Status: AC

## 2021-09-09 ENCOUNTER — Other Ambulatory Visit: Payer: Self-pay

## 2021-10-10 ENCOUNTER — Other Ambulatory Visit: Payer: Self-pay

## 2021-10-10 ENCOUNTER — Other Ambulatory Visit: Payer: Self-pay | Admitting: Internal Medicine

## 2021-10-10 DIAGNOSIS — K529 Noninfective gastroenteritis and colitis, unspecified: Secondary | ICD-10-CM | POA: Insufficient documentation

## 2021-10-10 MED ORDER — CIPROFLOXACIN HCL 500 MG PO TABS: 500.0000 mg | ORAL_TABLET | Freq: Two times a day (BID) | ORAL | 0 refills | Status: AC

## 2021-10-10 MED ORDER — CIPROFLOXACIN HCL 500 MG PO TABS
500.0000 mg | ORAL_TABLET | Freq: Two times a day (BID) | ORAL | 0 refills | Status: DC
  Filled 2021-10-10: qty 6, 3d supply, fill #0

## 2021-10-26 ENCOUNTER — Other Ambulatory Visit: Payer: Self-pay

## 2021-10-28 ENCOUNTER — Other Ambulatory Visit: Payer: Self-pay | Admitting: Nurse Practitioner

## 2021-10-28 ENCOUNTER — Ambulatory Visit
Admission: RE | Admit: 2021-10-28 | Discharge: 2021-10-28 | Disposition: A | Discharge status: Home / Self Care | Payer: Medicare Other | Source: Ambulatory Visit | Attending: Nurse Practitioner | Admitting: Nurse Practitioner

## 2021-10-28 DIAGNOSIS — R197 Diarrhea, unspecified: Secondary | ICD-10-CM | POA: Insufficient documentation

## 2021-10-28 DIAGNOSIS — R1084 Generalized abdominal pain: Secondary | ICD-10-CM

## 2021-10-28 HISTORY — DX: Type 2 diabetes mellitus without complications: E11.9

## 2021-10-28 LAB — POCT I-STAT CREATININE: Creatinine, Ser: 1.1 mg/dL — ABNORMAL HIGH (ref 0.44–1.00)

## 2021-10-28 MED ORDER — IOHEXOL 300 MG/ML  SOLN
100.0000 mL | Freq: Once | INTRAMUSCULAR | Status: AC | PRN
  Administered 2021-10-28: 100 mL via INTRAVENOUS

## 2021-11-02 ENCOUNTER — Other Ambulatory Visit: Payer: Self-pay | Admitting: Internal Medicine

## 2021-11-04 ENCOUNTER — Other Ambulatory Visit: Payer: Self-pay

## 2021-11-04 MED ORDER — VANCOMYCIN HCL 125 MG PO CAPS
ORAL_CAPSULE | ORAL | 0 refills | Status: DC
  Filled 2021-11-04: qty 40, 10d supply, fill #0

## 2021-11-22 ENCOUNTER — Other Ambulatory Visit: Payer: Self-pay

## 2021-11-29 ENCOUNTER — Other Ambulatory Visit: Payer: Self-pay | Admitting: Internal Medicine

## 2021-11-29 ENCOUNTER — Other Ambulatory Visit: Payer: Self-pay

## 2021-11-29 MED ORDER — LEVOTHYROXINE SODIUM 25 MCG PO TABS
ORAL_TABLET | Freq: Every day | ORAL | 0 refills | Status: DC
Start: 2021-11-29 — End: 2022-02-22
  Filled 2021-11-29: qty 90, 90d supply, fill #0

## 2021-11-29 MED ORDER — SEMAGLUTIDE(0.25 OR 0.5MG/DOS) 2 MG/1.5ML ~~LOC~~ SOPN: 0.5000 mg | PEN_INJECTOR | SUBCUTANEOUS | 0 refills | Status: DC

## 2021-11-30 ENCOUNTER — Other Ambulatory Visit: Payer: Self-pay

## 2021-12-06 ENCOUNTER — Other Ambulatory Visit: Payer: Self-pay

## 2021-12-15 ENCOUNTER — Encounter: Payer: Self-pay | Admitting: Internal Medicine

## 2021-12-16 MED ORDER — HYDROCOD POLI-CHLORPHE POLI ER 10-8 MG/5ML PO SUER: 5.0000 mL | Freq: Every evening | ORAL | 0 refills | Status: DC | PRN

## 2021-12-16 MED ORDER — BENZONATATE 200 MG PO CAPS: 200.0000 mg | ORAL_CAPSULE | Freq: Three times a day (TID) | ORAL | 1 refills | Status: DC | PRN

## 2021-12-26 ENCOUNTER — Other Ambulatory Visit: Payer: Self-pay

## 2021-12-27 ENCOUNTER — Other Ambulatory Visit: Payer: Self-pay

## 2022-01-25 ENCOUNTER — Other Ambulatory Visit: Payer: Self-pay

## 2022-02-19 ENCOUNTER — Other Ambulatory Visit: Payer: Self-pay | Admitting: Family

## 2022-02-19 ENCOUNTER — Other Ambulatory Visit: Payer: Self-pay | Admitting: Internal Medicine

## 2022-02-19 MED FILL — Fluconazole Tab 150 MG: ORAL | 2 days supply | Qty: 2 | Fill #1 | Status: AC

## 2022-02-21 ENCOUNTER — Other Ambulatory Visit: Payer: Self-pay

## 2022-02-21 ENCOUNTER — Other Ambulatory Visit: Payer: Self-pay | Admitting: Internal Medicine

## 2022-02-21 MED ORDER — DILTIAZEM HCL ER COATED BEADS 240 MG PO CP24
240.0000 mg | ORAL_CAPSULE | Freq: Every day | ORAL | 3 refills | Status: DC
  Filled 2022-02-21: qty 90, 90d supply, fill #0
  Filled 2022-04-17: qty 90, 90d supply, fill #1

## 2022-02-21 MED ORDER — ROSUVASTATIN CALCIUM 5 MG PO TABS
5.0000 mg | ORAL_TABLET | Freq: Every day | ORAL | 3 refills | Status: DC
  Filled 2022-02-21: qty 90, 90d supply, fill #0
  Filled 2022-04-17 – 2022-05-18 (×2): qty 90, 90d supply, fill #1
  Filled 2022-08-16: qty 90, 90d supply, fill #2

## 2022-02-22 ENCOUNTER — Other Ambulatory Visit: Payer: Self-pay

## 2022-02-22 MED FILL — Levothyroxine Sodium Tab 25 MCG: ORAL | 90 days supply | Qty: 90 | Fill #0 | Status: AC

## 2022-02-25 ENCOUNTER — Other Ambulatory Visit: Payer: Self-pay

## 2022-03-22 ENCOUNTER — Other Ambulatory Visit: Payer: Self-pay | Admitting: Internal Medicine

## 2022-03-22 ENCOUNTER — Other Ambulatory Visit: Payer: Self-pay

## 2022-03-23 MED ORDER — SEMAGLUTIDE(0.25 OR 0.5MG/DOS) 2 MG/1.5ML ~~LOC~~ SOPN
0.5000 mg | PEN_INJECTOR | SUBCUTANEOUS | 0 refills | Status: DC
Start: 2022-03-23 — End: 2022-08-16

## 2022-03-29 NOTE — Telephone Encounter (Signed)
MyChart messgae sent to patient. 

## 2022-03-30 NOTE — Telephone Encounter (Signed)
See Dr. Lupita Dawn comment.

## 2022-03-30 NOTE — Telephone Encounter (Signed)
Error

## 2022-04-14 ENCOUNTER — Other Ambulatory Visit: Payer: Self-pay | Admitting: Internal Medicine

## 2022-04-14 MED ORDER — FOSFOMYCIN TROMETHAMINE 3 G PO PACK: 3.0000 g | PACK | Freq: Once | ORAL | 0 refills | Status: AC

## 2022-04-17 ENCOUNTER — Other Ambulatory Visit: Payer: Self-pay

## 2022-04-17 ENCOUNTER — Other Ambulatory Visit: Payer: Self-pay | Admitting: Internal Medicine

## 2022-04-17 MED FILL — Fluconazole Tab 150 MG: ORAL | 2 days supply | Qty: 2 | Fill #2 | Status: AC

## 2022-04-18 ENCOUNTER — Other Ambulatory Visit: Payer: Self-pay

## 2022-04-19 ENCOUNTER — Other Ambulatory Visit: Payer: Self-pay

## 2022-04-19 MED ORDER — LEVOTHYROXINE SODIUM 25 MCG PO TABS
25.0000 ug | ORAL_TABLET | Freq: Every day | ORAL | 0 refills | Status: DC
  Filled 2022-04-19 – 2022-05-18 (×2): qty 90, 90d supply, fill #0

## 2022-04-25 ENCOUNTER — Encounter: Payer: Self-pay | Admitting: Internal Medicine

## 2022-04-26 ENCOUNTER — Other Ambulatory Visit: Payer: Self-pay | Admitting: Internal Medicine

## 2022-04-26 ENCOUNTER — Other Ambulatory Visit: Payer: Self-pay

## 2022-04-26 MED FILL — Estradiol Vaginal Cream 0.1 MG/GM: VAGINAL | 90 days supply | Qty: 42.5 | Fill #0 | Status: AC

## 2022-04-26 NOTE — Telephone Encounter (Signed)
Duplicate request

## 2022-04-26 NOTE — Telephone Encounter (Signed)
Not in pt's current medication list.

## 2022-04-27 ENCOUNTER — Other Ambulatory Visit: Payer: Self-pay

## 2022-05-04 ENCOUNTER — Other Ambulatory Visit: Payer: Self-pay | Admitting: Internal Medicine

## 2022-05-04 MED ORDER — BENZONATATE 200 MG PO CAPS: 200.0000 mg | ORAL_CAPSULE | Freq: Three times a day (TID) | ORAL | 1 refills | Status: DC | PRN

## 2022-05-04 MED ORDER — AMOXICILLIN-POT CLAVULANATE 875-125 MG PO TABS: 1.0000 | ORAL_TABLET | Freq: Two times a day (BID) | ORAL | 0 refills | Status: DC

## 2022-05-04 MED ORDER — PREDNISONE 10 MG PO TABS: ORAL_TABLET | ORAL | 0 refills | Status: DC

## 2022-05-05 ENCOUNTER — Other Ambulatory Visit: Payer: Self-pay | Admitting: Internal Medicine

## 2022-05-05 ENCOUNTER — Encounter: Payer: Self-pay | Admitting: Internal Medicine

## 2022-05-05 DIAGNOSIS — U071 COVID-19: Secondary | ICD-10-CM | POA: Insufficient documentation

## 2022-05-05 MED ORDER — MOLNUPIRAVIR EUA 200MG CAPSULE: 4.0000 | ORAL_CAPSULE | Freq: Two times a day (BID) | ORAL | 0 refills | Status: AC

## 2022-05-18 MED FILL — Estradiol Vaginal Cream 0.1 MG/GM: VAGINAL | 90 days supply | Qty: 42.5 | Fill #1 | Status: CN

## 2022-05-19 ENCOUNTER — Other Ambulatory Visit: Payer: Self-pay

## 2022-05-19 MED ORDER — NA SULFATE-K SULFATE-MG SULF 17.5-3.13-1.6 GM/177ML PO SOLN
ORAL | 0 refills | Status: DC
  Filled 2022-05-19: qty 354, 1d supply, fill #0

## 2022-05-19 MED ORDER — METOPROLOL SUCCINATE ER 25 MG PO TB24
25.0000 mg | ORAL_TABLET | Freq: Two times a day (BID) | ORAL | 11 refills | Status: DC
  Filled 2022-05-19: qty 60, 30d supply, fill #0
  Filled 2022-06-22: qty 60, 30d supply, fill #1

## 2022-05-19 MED ORDER — DILTIAZEM HCL ER COATED BEADS 240 MG PO CP24
240.0000 mg | ORAL_CAPSULE | Freq: Every day | ORAL | 3 refills | Status: DC
  Filled 2022-05-19: qty 90, 90d supply, fill #0
  Filled 2022-08-16: qty 90, 90d supply, fill #1
  Filled 2022-11-29: qty 90, 90d supply, fill #2
  Filled 2023-02-19: qty 90, 90d supply, fill #3

## 2022-06-02 ENCOUNTER — Ambulatory Visit (INDEPENDENT_AMBULATORY_CARE_PROVIDER_SITE_OTHER): Payer: Medicare Other

## 2022-06-02 VITALS — Ht 68.0 in | Wt 278.0 lb

## 2022-06-02 DIAGNOSIS — Z0000 Encounter for general adult medical examination without abnormal findings: Secondary | ICD-10-CM

## 2022-06-02 NOTE — Patient Instructions (Addendum)
[name] , Thank you for taking time to come for your Medicare Wellness Visit. I appreciate your ongoing commitment to your health goals. Please review the following plan we discussed and let me know if I can assist you in the future.   These are the goals we discussed:  Goals      Follow up with Primary Care Provider     As needed Increase physical activity  Lower glucose level        This is a list of the screening recommended for you and due dates:  Health Maintenance  Topic Date Due   DTaP/Tdap/Td vaccine (1 - Tdap) Never done   Hemoglobin A1C  01/21/2022   Eye exam for diabetics  05/18/2022   Mammogram  06/15/2022*   COVID-19 Vaccine (4 - 2023-24 season) 06/18/2022*   Flu Shot  07/17/2022*   Hepatitis C Screening: USPSTF Recommendation to screen - Ages 18-79 yo.  06/03/2023*   Complete foot exam   06/29/2022   Yearly kidney function blood test for diabetes  07/23/2022   Yearly kidney health urinalysis for diabetes  07/23/2022   Medicare Annual Wellness Visit  06/03/2023   Colon Cancer Screening  02/21/2027   Pneumonia Vaccine  Completed   DEXA scan (bone density measurement)  Completed   Zoster (Shingles) Vaccine  Completed   HPV Vaccine  Aged Out  *Topic was postponed. The date shown is not the original due date.    Advanced directives: End of life planning; Advance aging; Advanced directives discussed.  Copy of current HCPOA/Living Will requested.    Conditions/risks identified: none new.  Next appointment: Follow up in one year for your annual wellness visit    Preventive Care 65 Years and Older, Female Preventive care refers to lifestyle choices and visits with your health care provider that can promote health and wellness. What does preventive care include? A yearly physical exam. This is also called an annual well check. Dental exams once or twice a year. Routine eye exams. Ask your health care provider how often you should have your eyes  checked. Personal lifestyle choices, including: Daily care of your teeth and gums. Regular physical activity. Eating a healthy diet. Avoiding tobacco and drug use. Limiting alcohol use. Practicing safe sex. Taking low-dose aspirin every day. Taking vitamin and mineral supplements as recommended by your health care provider. What happens during an annual well check? The services and screenings done by your health care provider during your annual well check will depend on your age, overall health, lifestyle risk factors, and family history of disease. Counseling  Your health care provider may ask you questions about your: Alcohol use. Tobacco use. Drug use. Emotional well-being. Home and relationship well-being. Sexual activity. Eating habits. History of falls. Memory and ability to understand (cognition). Work and work Statistician. Reproductive health. Screening  You may have the following tests or measurements: Height, weight, and BMI. Blood pressure. Lipid and cholesterol levels. These may be checked every 5 years, or more frequently if you are over 83 years old. Skin check. Lung cancer screening. You may have this screening every year starting at age 78 if you have a 30-pack-year history of smoking and currently smoke or have quit within the past 15 years. Fecal occult blood test (FOBT) of the stool. You may have this test every year starting at age 44. Flexible sigmoidoscopy or colonoscopy. You may have a sigmoidoscopy every 5 years or a colonoscopy every 10 years starting at age 10. Hepatitis C  blood test. Hepatitis B blood test. Sexually transmitted disease (STD) testing. Diabetes screening. This is done by checking your blood sugar (glucose) after you have not eaten for a while (fasting). You may have this done every 1-3 years. Bone density scan. This is done to screen for osteoporosis. You may have this done starting at age 65. Mammogram. This may be done every 1-2  years. Talk to your health care provider about how often you should have regular mammograms. Talk with your health care provider about your test results, treatment options, and if necessary, the need for more tests. Vaccines  Your health care provider may recommend certain vaccines, such as: Influenza vaccine. This is recommended every year. Tetanus, diphtheria, and acellular pertussis (Tdap, Td) vaccine. You may need a Td booster every 10 years. Zoster vaccine. You may need this after age 96. Pneumococcal 13-valent conjugate (PCV13) vaccine. One dose is recommended after age 53. Pneumococcal polysaccharide (PPSV23) vaccine. One dose is recommended after age 56. Talk to your health care provider about which screenings and vaccines you need and how often you need them. This information is not intended to replace advice given to you by your health care provider. Make sure you discuss any questions you have with your health care provider. Document Released: 05/01/2015 Document Revised: 12/23/2015 Document Reviewed: 02/03/2015 Elsevier Interactive Patient Education  2017 [place] Prevention in the Home Falls can cause injuries. They can happen to people of all ages. There are many things you can do to make your home safe and to help prevent falls. What can I do on the outside of my home? Regularly fix the edges of walkways and driveways and fix any cracks. Remove anything that might make you trip as you walk through a door, such as a raised step or threshold. Trim any bushes or trees on the path to your home. Use bright outdoor lighting. Clear any walking paths of anything that might make someone trip, such as rocks or tools. Regularly check to see if handrails are loose or broken. Make sure that both sides of any steps have handrails. Any raised decks and porches should have guardrails on the edges. Have any leaves, snow, or ice cleared regularly. Use sand or salt on walking paths  during winter. Clean up any spills in your garage right away. This includes oil or grease spills. What can I do in the bathroom? Use night lights. Install grab bars by the toilet and in the tub and shower. Do not use towel bars as grab bars. Use non-skid mats or decals in the tub or shower. If you need to sit down in the shower, use a plastic, non-slip stool. Keep the floor dry. Clean up any water that spills on the floor as soon as it happens. Remove soap buildup in the tub or shower regularly. Attach bath mats securely with double-sided non-slip rug tape. Do not have throw rugs and other things on the floor that can make you trip. What can I do in the bedroom? Use night lights. Make sure that you have a light by your bed that is easy to reach. Do not use any sheets or blankets that are too big for your bed. They should not hang down onto the floor. Have a firm chair that has side arms. You can use this for support while you get dressed. Do not have throw rugs and other things on the floor that can make you trip. What can I do in the kitchen?  Clean up any spills right away. Avoid walking on wet floors. Keep items that you use a lot in easy-to-reach places. If you need to reach something above you, use a strong step stool that has a grab bar. Keep electrical cords out of the way. Do not use floor polish or wax that makes floors slippery. If you must use wax, use non-skid floor wax. Do not have throw rugs and other things on the floor that can make you trip. What can I do with my stairs? Do not leave any items on the stairs. Make sure that there are handrails on both sides of the stairs and use them. Fix handrails that are broken or loose. Make sure that handrails are as long as the stairways. Check any carpeting to make sure that it is firmly attached to the stairs. Fix any carpet that is loose or worn. Avoid having throw rugs at the top or bottom of the stairs. If you do have throw  rugs, attach them to the floor with carpet tape. Make sure that you have a light switch at the top of the stairs and the bottom of the stairs. If you do not have them, ask someone to add them for you. What else can I do to help prevent falls? Wear shoes that: Do not have high heels. Have rubber bottoms. Are comfortable and fit you well. Are closed at the toe. Do not wear sandals. If you use a stepladder: Make sure that it is fully opened. Do not climb a closed stepladder. Make sure that both sides of the stepladder are locked into place. Ask someone to hold it for you, if possible. Clearly mark and make sure that you can see: Any grab bars or handrails. First and last steps. Where the edge of each step is. Use tools that help you move around (mobility aids) if they are needed. These include: Canes. Walkers. Scooters. Crutches. Turn on the lights when you go into a dark area. Replace any light bulbs as soon as they burn out. Set up your furniture so you have a clear path. Avoid moving your furniture around. If any of your floors are uneven, fix them. If there are any pets around you, be aware of where they are. Review your medicines with your doctor. Some medicines can make you feel dizzy. This can increase your chance of falling. Ask your doctor what other things that you can do to help prevent falls. This information is not intended to replace advice given to you by your health care provider. Make sure you discuss any questions you have with your health care provider. Document Released: 01/29/2009 Document Revised: 09/10/2015 Document Reviewed: 05/09/2014 Elsevier Interactive Patient Education  2017 [employer].

## 2022-06-02 NOTE — Progress Notes (Signed)
Subjective:   Tiffany Wells is a 67 y.o. female who presents for Medicare Annual (Subsequent) preventive examination.  Review of Systems    No ROS.  Medicare Wellness Virtual Visit.  Visual/audio telehealth visit, UTA vital signs.   See social history for additional risk factors.   Cardiac Risk Factors include: advanced age (>14mn, >>77women);diabetes mellitus;hypertension     Objective:    Today's Vitals   06/02/22 1305  Weight: 278 lb (126.1 kg)  Height: 5' 8"$  (1.727 m)   Body mass index is 42.27 kg/m.     06/02/2022    1:07 PM 05/31/2021    1:35 PM 05/28/2020    1:43 PM  Advanced Directives  Does Patient Have a Medical Advance Directive? Yes Yes Yes  Type of AParamedicof [place]ake [place] will [place] will [place] will  Does patient want to make changes to medical advance directive? No - Patient declined No - Patient declined No - Patient declined  Copy of [place] Chart? No - copy requested No - copy requested No - copy requested    Current Medications (verified) Outpatient Encounter Medications as of 06/02/2022  Medication Sig   amoxicillin-clavulanate (AUGMENTIN) 875-125 MG tablet Take 1 tablet by mouth 2 (two) times daily.   benzonatate (TESSALON) 200 MG capsule Take 1 capsule (200 mg total) by mouth 3 (three) times daily as needed for cough.   chlorpheniramine-HYDROcodone (TUSSIONEX) 10-8 MG/5ML Take 5 mLs by mouth at bedtime as needed for cough.   clindamycin (CLEOCIN T) 1 % external solution Apply to affected sites on scalp twice a day until clear, then as needed   dicyclomine (BENTYL) 10 MG capsule TAKE 1 CAPSULE BY MOUTH 3 TIMES DAILY BEFORE MEALS.   diltiazem (CARDIZEM CD) 240 MG 24 hr capsule TAKE 1 CAPSULE BY MOUTH ONCE DAILY   diltiazem (CARDIZEM CD) 240 MG 24 hr capsule Take 1 capsule (240 mg total) by mouth daily.   diltiazem (CARDIZEM CD) 240 MG 24  hr capsule Take 1 capsule (240 mg total) by mouth daily.   diltiazem (CARDIZEM) 30 MG tablet Take 1 tablet (30 mg total) by mouth daily as needed (SVT).   diphenhydrAMINE (BENADRYL) 25 MG tablet As needed for allergies.   diphenoxylate-atropine (LOMOTIL) 2.5-0.025 MG tablet TAKE ONE TABLET BY MOUTH AS NEEDED   estradiol (ESTRACE) 0.1 MG/GM vaginal cream PLACE 1/4 APPLICATORFUL VAGINALLY DAILY.   fluconazole (DIFLUCAN) 150 MG tablet TAKE 1 TABLET (150 MG TOTAL) BY MOUTH DAILY FOR 2 DAYS   hyoscyamine (LEVSIN SL) 0.125 MG SL tablet Place 1 tablet (0.125 mg total) under the tongue every 4 (four) hours as needed for cramping.   levothyroxine (SYNTHROID) 25 MCG tablet Take 1 tablet (25 mcg total) by mouth daily before breakfast.   metoprolol succinate (TOPROL-XL) 25 MG 24 hr tablet TAKE 1 TABLET (25 MG TOTAL) BY MOUTH 2 (TWO) TIMES DAILY   metoprolol succinate (TOPROL-XL) 25 MG 24 hr tablet TAKE 1 TABLET (25 MG TOTAL) BY MOUTH 2 (TWO) TIMES DAILY   metoprolol succinate (TOPROL-XL) 25 MG 24 hr tablet Take 1 tablet (25 mg total) by mouth 2 (two) times daily.   Na Sulfate-K Sulfate-Mg Sulf 17.5-3.13-1.6 GM/177ML SOLN Take both bottles at the times instructed by your provider.   omeprazole (PRILOSEC OTC) 20 MG tablet Take 20 mg by mouth daily.   ondansetron (ZOFRAN ODT) 4 MG disintegrating tablet Take 1 tablet (4 mg total) by mouth every  8 (eight) hours as needed for nausea or vomiting.   OZEMPIC, 0.25 OR 0.5 MG/DOSE, 2 MG/3ML SOPN INJECT 0.5 MG INTO THE SKIN ONCE A WEEK.   predniSONE (DELTASONE) 10 MG tablet 6 tablets on Day 1 , then reduce by 1 tablet daily until gone   promethazine (PHENERGAN) 12.5 MG tablet Take 1 tablet (12.5 mg total) by mouth every 8 (eight) hours as needed for nausea or vomiting.   rosuvastatin (CRESTOR) 5 MG tablet Take 1 tablet (5 mg total) by mouth daily.   Semaglutide,0.25 or 0.5MG/DOS, 2 MG/1.5ML SOPN Inject 0.5 mg into the skin once a week.   vancomycin (VANCOCIN) 125 MG  capsule Take 1 capsule (125 mg total) by mouth 4 (four) times daily for 10 days   No facility-administered encounter medications on file as of 06/02/2022.    Allergies (verified) Codeine, Codeine sulfate, Glucophage xr [metformin], Sulfa antibiotics, and Tape   History: Past Medical History:  Diagnosis Date   Acute pulmonary embolism ([place]) 08/06/2013   Diagnosed during hospitalization for TKR.  Patient received heparin followed by Xarelto whic hwill continue for a total of 6 months.   [facility] has recommended IVC placement prior to next TKR and bridging with Lovenox (June 17  [place])     Arthritis    osteoarthritis of knee   Back pain    Diabetes mellitus without complication ([place])    DVT (deep venous thrombosis) ([place]) 12/28/2016   Dysrhythmia 12/28/2016   atrial fibrillation, SVT   Headache    Migraines   History of Salmonella gastroenteritis 2012   Hypertension    IBS (irritable bowel syndrome)    Internal hemorrhoids    Ischemic colitis ([place]) 02/21/2014   Normal cardiac stress test    2006, nromal stress cardiolite, EF 65%   Pulmonary embolism ([place])    post knee surgery   Past Surgical History:  Procedure Laterality Date   COLONOSCOPY  03/07/2011   Int Hemms   COLONOSCOPY WITH PROPOFOL N/A 02/20/2017   Procedure: COLONOSCOPY WITH PROPOFOL;  Surgeon: Manya Silvas, MD;  Location: [facility] ENDOSCOPY;  Service: Endoscopy;  Laterality: N/A;   HYSTEROSCOPY     JOINT REPLACEMENT     Bilateral TKR   ROTATOR CUFF REPAIR     TUBAL LIGATION     UPPER GASTROINTESTINAL ENDOSCOPY  03/07/2011   HH Multip Gastric Polyps/ Dr Vira Agar   Family History  Problem Relation Age of Onset   Hypertension Mother    Diabetes Mother    Cancer Mother        glioma   Hypertension Father    Heart disease Father 14       CAD with CABG   Cancer Father        multiple myeloma   Arthritis Sister    Hypertension Sister    Cancer Brother        Pre-cancerous lip lesion, testicular CA    Diabetes  Daughter    Cancer Maternal Grandmother        Lung Ca, nonsmoker    Social History   Socioeconomic History   Marital status: Married    Spouse name: Not on file   Number of children: 2   Years of education: Not on file   Highest education level: Not on file  Occupational History   Occupation: Glass blower/designer, [employer]  Tobacco Use   Smoking status: Never   Smokeless tobacco: Never  Vaping Use   Vaping Use: Never used  Substance and Sexual  Activity   Alcohol use: Yes    Alcohol/week: 2.0 standard drinks of alcohol    Types: 2 Glasses of wine per week    Comment: very rare   Drug use: No   Sexual activity: Not on file  Other Topics Concern   Not on file  Social History Narrative   Lives with husband.   Social Determinants of Health   Financial Resource Strain: Low Risk  (06/02/2022)   Overall Financial Resource Strain (CARDIA)    Difficulty of Paying Living Expenses: Not hard at all  Food Insecurity: No Food Insecurity (06/02/2022)   Hunger Vital Sign    Worried About Running Out of Food in the Last Year: Never true    Ran Out of Food in the Last Year: Never true  Transportation Needs: No Transportation Needs (06/02/2022)   PRAPARE - Hydrologist (Medical): No    Lack of Transportation (Non-Medical): No  Physical Activity: Unknown (06/02/2022)   Exercise Vital Sign    Days of Exercise per Week: 0 days    Minutes of Exercise per Session: Not on file  Stress: No Stress Concern Present (06/02/2022)   [place]    Feeling of Stress : Not at all  Social Connections: Unknown (06/02/2022)   Social Connection and Isolation Panel [NHANES]    Frequency of Communication with Friends and Family: More than three times a week    Frequency of Social Gatherings with Friends and Family: More than three times a week    Attends Religious Services: Not on Advertising copywriter  or Organizations: Not on file    Attends Archivist Meetings: Not on file    Marital Status: Married    Tobacco Counseling Counseling given: Not Answered   Clinical Intake:  Pre-visit preparation completed: Yes        Diabetes: Yes (Followed by PCP)  How often do you need to have someone help you when you read instructions, pamphlets, or other written materials from your doctor or pharmacy?: 1 - Never  Nutrition Risk Assessment: Does the patient have any non-healing wounds?  No  Has the patient had any unintentional weight loss or weight gain?  No   Diabetes: Is the patient diabetic?  Yes  If diabetic, was a CBG obtained today?  No  Did the patient bring in their glucometer from home?  No  How often do you monitor your CBG's? Twice weekly.   Financial Strains and Diabetes Management: Are you having any financial strains with the device, your supplies or your medication? No .  Does the patient want to be seen by Chronic Care Management for management of their diabetes?  No  Would the patient like to be referred to a Nutritionist or for Diabetic Management?  No     Interpreter Needed?: No      Activities of Daily Living    06/02/2022    1:10 PM  In your present state of health, do you have any difficulty performing the following activities:  Hearing? 0  Vision? 0  Difficulty concentrating or making decisions? 0  Walking or climbing stairs? 0  Dressing or bathing? 0  Doing errands, shopping? 0  Preparing Food and eating ? N  Using the Toilet? N  In the past six months, have you accidently leaked urine? N  Do you have problems with loss of bowel control? N  Managing your  Medications? N  Managing your Finances? N  Housekeeping or managing your Housekeeping? N    Patient Care Team: Crecencio Mc, MD as PCP - General (Internal Medicine)  Indicate any recent Medical Services you may have received from other than [facility] providers in the past year (date  may be approximate).     Assessment:   This is a routine wellness examination for [facility].  I connected with  [name] on 06/02/22 by a audio enabled telemedicine application and verified that I am speaking with the correct person using two identifiers.  Patient Location: Home  Provider Location: Office/Clinic  I discussed the limitations of evaluation and management by telemedicine. The patient expressed understanding and agreed to proceed.   Hearing/Vision screen Hearing Screening - Comments:: Patient is able to hear conversational tones without difficulty. No issues reported.  Vision Screening - Comments:: Wears corrective lenses They have seen their ophthalmologist in the last 12 months.  Dietary issues and exercise activities discussed: Current Exercise Habits: The patient does not participate in regular exercise at present She tries to have a healthy diet.    Goals Addressed             This Visit's Progress    Follow up with Primary Care Provider       As needed Increase physical activity  Lower glucose level       Depression Screen    06/02/2022    1:10 PM 06/28/2021    1:42 PM 05/31/2021    1:20 PM 12/28/2020    4:51 PM 05/28/2020    1:42 PM 05/11/2017   10:04 AM 04/26/2012    9:18 AM  PHQ 2/9 Scores  PHQ - 2 Score 0 0 0 0 0 0 0  PHQ- 9 Score      3     Fall Risk    06/02/2022    1:09 PM 06/28/2021    1:41 PM 05/31/2021    2:01 PM 12/28/2020    4:51 PM 05/28/2020    1:44 PM  [place] in the past year? 0 0 0 0 0  Number falls in past yr: 0  0  0  Injury with Fall? 0    0  Risk for fall due to :  No Fall Risks     Follow up Falls evaluation completed;Falls prevention discussed Falls evaluation completed Falls evaluation completed Falls evaluation completed Falls evaluation completed    FALL RISK PREVENTION PERTAINING TO THE HOME: Home free of loose throw rugs in walkways, pet beds, electrical cords, etc? Yes  Adequate lighting in your  home to reduce risk of falls? Yes   ASSISTIVE DEVICES UTILIZED TO PREVENT FALLS: Life alert? No  Use of a cane, walker or w/c? No   TIMED UP AND GO: Was the test performed? No .   Cognitive Function:        06/02/2022    1:21 PM  6CIT Screen  What Year? 0 points  What month? 0 points  What time? 0 points  Count back from 20 0 points  Months in reverse 0 points  Repeat phrase 0 points  Total Score 0 points    Immunizations Immunization History  Administered Date(s) Administered   Fluad Quad(high Dose 65+) 01/31/2020   Influenza Split 01/01/2013, 01/05/2014   Influenza-Unspecified 01/17/2012, 01/18/2017, 01/21/2018, 01/24/2019   PFIZER(Purple Top)SARS-COV-2 Vaccination 05/02/2019, 05/23/2019, 04/27/2020   PNEUMOCOCCAL CONJUGATE-20 09/07/2020   Pneumococcal Polysaccharide-23 06/22/2018   Zoster Recombinat (Shingrix)  09/05/2018, 11/20/2018   Zoster, Live 10/22/2013   TDAP status: Due, Education has been provided regarding the importance of this vaccine. Advised may receive this vaccine at local pharmacy or Health Dept. Aware to provide a copy of the vaccination record if obtained from local pharmacy or Health Dept. Verbalized acceptance and understanding.  Flu Vaccine status: Declined, Education has been provided regarding the importance of this vaccine but patient still declined. Advised may receive this vaccine at local pharmacy or Health Dept. Aware to provide a copy of the vaccination record if obtained from local pharmacy or Health Dept. Verbalized acceptance and understanding.  Covid-19 vaccine status: Completed vaccines  Screening Tests Health Maintenance  Topic Date Due   DTaP/Tdap/Td (1 - Tdap) Never done   HEMOGLOBIN A1C  01/21/2022   OPHTHALMOLOGY EXAM  05/18/2022   MAMMOGRAM  06/15/2022 (Originally 01/14/2022)   COVID-19 Vaccine (4 - 2023-24 season) 06/18/2022 (Originally 12/17/2021)   INFLUENZA VACCINE  07/17/2022 (Originally 11/16/2021)   Hepatitis C Screening   06/03/2023 (Originally 10/22/1973)   FOOT EXAM  06/29/2022   Diabetic kidney evaluation - eGFR measurement  07/23/2022   Diabetic kidney evaluation - Urine ACR  07/23/2022   Medicare Annual Wellness (AWV)  06/03/2023   COLONOSCOPY (Pts 45-41yr Insurance coverage will need to be confirmed)  02/21/2027   Pneumonia Vaccine 67 Years old  Completed   DEXA SCAN  Completed   Zoster Vaccines- Shingrix  Completed   HPV VACCINES  Aged Out    Health Maintenance Health Maintenance Due  Topic Date Due   DTaP/Tdap/Td (1 - Tdap) Never done   HEMOGLOBIN A1C  01/21/2022   OPHTHALMOLOGY EXAM  05/18/2022   Mammogram- agrees to schedule with [facility].  Unsure if additional order is needed. Deferred. Follow up with pcp next ov on 06/15/22.   Lung Cancer Screening: (Low Dose CT Chest recommended if Age 69-80years, 30 pack-year currently smoking OR have quit w/in 15years.) does not qualify.   Hepatitis C Screening: deferred per patient.   Vision Screening: Recommended annual ophthalmology exams for early detection of glaucoma and other disorders of the eye.  Dental Screening: Recommended annual dental exams for proper oral hygiene  Community Resource Referral / Chronic Care Management: CRR required this visit?  No   CCM required this visit?  No      Plan:     I have personally reviewed and noted the following in the patient's chart:   Medical and social history Use of alcohol, tobacco or illicit drugs  Current medications and supplements including opioid prescriptions. Patient is not currently taking opioid prescriptions. Functional ability and status Nutritional status Physical activity Advanced directives List of other physicians Hospitalizations, surgeries, and ER visits in previous 12 months Vitals Screenings to include cognitive, depression, and falls Referrals and appointments  In addition, I have reviewed and discussed with patient certain preventive protocols, quality  metrics, and best practice recommendations. A written personalized care plan for preventive services as well as general preventive health recommendations were provided to patient.     DLeta Jungling LPN   [id number]

## 2022-06-06 LAB — HM MAMMOGRAPHY

## 2022-06-15 ENCOUNTER — Ambulatory Visit: Payer: Medicare Other | Admitting: Internal Medicine

## 2022-06-23 ENCOUNTER — Other Ambulatory Visit: Payer: Self-pay | Admitting: Internal Medicine

## 2022-06-23 ENCOUNTER — Other Ambulatory Visit: Payer: Self-pay

## 2022-06-23 MED ORDER — FOSFOMYCIN TROMETHAMINE 3 G PO PACK
3.0000 g | PACK | Freq: Once | ORAL | 0 refills | Status: AC
  Filled 2022-06-23: qty 3, 1d supply, fill #0

## 2022-07-04 ENCOUNTER — Encounter: Payer: Self-pay | Admitting: Internal Medicine

## 2022-07-04 ENCOUNTER — Ambulatory Visit (INDEPENDENT_AMBULATORY_CARE_PROVIDER_SITE_OTHER): Payer: Medicare Other | Admitting: Internal Medicine

## 2022-07-04 VITALS — BP 118/66 | HR 68 | Temp 97.5°F | Ht 68.0 in | Wt 279.6 lb

## 2022-07-04 DIAGNOSIS — E039 Hypothyroidism, unspecified: Secondary | ICD-10-CM | POA: Diagnosis not present

## 2022-07-04 DIAGNOSIS — E1169 Type 2 diabetes mellitus with other specified complication: Secondary | ICD-10-CM

## 2022-07-04 DIAGNOSIS — I152 Hypertension secondary to endocrine disorders: Secondary | ICD-10-CM

## 2022-07-04 DIAGNOSIS — E669 Obesity, unspecified: Secondary | ICD-10-CM | POA: Diagnosis not present

## 2022-07-04 DIAGNOSIS — I959 Hypotension, unspecified: Secondary | ICD-10-CM

## 2022-07-04 DIAGNOSIS — E785 Hyperlipidemia, unspecified: Secondary | ICD-10-CM

## 2022-07-04 DIAGNOSIS — I471 Supraventricular tachycardia, unspecified: Secondary | ICD-10-CM

## 2022-07-04 DIAGNOSIS — E1159 Type 2 diabetes mellitus with other circulatory complications: Secondary | ICD-10-CM

## 2022-07-04 DIAGNOSIS — Z8719 Personal history of other diseases of the digestive system: Secondary | ICD-10-CM

## 2022-07-04 DIAGNOSIS — K529 Noninfective gastroenteritis and colitis, unspecified: Secondary | ICD-10-CM

## 2022-07-04 DIAGNOSIS — R5383 Other fatigue: Secondary | ICD-10-CM

## 2022-07-04 LAB — POCT GLYCOSYLATED HEMOGLOBIN (HGB A1C): Hemoglobin A1C: 6.2 % — AB (ref 4.0–5.6)

## 2022-07-04 MED ORDER — METOPROLOL SUCCINATE ER 25 MG PO TB24: 25.0000 mg | ORAL_TABLET | Freq: Every day | ORAL | 1 refills | Status: DC

## 2022-07-04 NOTE — Assessment & Plan Note (Signed)
Diagnosed by flex sig in 2015 during hospitalization,  repeat episode presumed Jan 2024.  For colonoscopy May8

## 2022-07-04 NOTE — Progress Notes (Signed)
Subjective:  Patient ID: Tiffany Wells, female    DOB: 10/30/1955  Age: 67 y.o. MRN: [id number]  CC: The primary encounter diagnosis was Obesity, diabetes, and hypertension syndrome ([place]). Diagnoses of Acquired hypothyroidism, Hyperlipidemia, unspecified hyperlipidemia type, Other fatigue, Colitis, SVT (supraventricular tachycardia), Morbid obesity ([place]), History of ischemic colitis, and Hypotension, unspecified hypotension type were also pertinent to this visit.   HPI SHELENE HOGLEN presents for  Chief Complaint  Patient presents with   Medical Management of Chronic Issues    Yearly follow up    1) Type 2 DM with obesity:   SHE HAS been taking ozempic and Crestor.  Weight is  Down 30 lbs since starting GLP 1 agonist  in Sept 2022 , but has been unable to advance dose to 0.5 mg weekly due to persistent side effects , although she notes that the higher dose kept her fastings < 120.  She did not tolerate metformin due to persistent incontrolled diarrhea in the setting of IBS and ischemic colitis    2) recurrent episode of crampy abdominal pain,  followed by [facility] .  Recent episode in January while vacationing in [place],   post COVID  recovery,  presented with abdominal bloating which progressed to severe abdominal pain, then hematochezia.     Most recent episode recurrent with bloating,  pain, and blurred vision .  Thought it May be associated with ozempic (0.5 dose) so stopped it 9 days ago was her last dose.  h/o ischemic colitis by 2015 flex sig,  normal colonoscopy in  2018.  Repeat scheduled May 8 [place], [facility]).    3) Also feeling cold all the time,  BP runs low ; at home readings are 90/60 on toprol xl 25 m bid and diltiazem 240 mg for SVT.  Taking 25 mcg levothyroxine   Lab Results  Component Value Date   TSH 4.04 07/22/2021  '    Outpatient Medications Prior to Visit  Medication Sig Dispense Refill   clindamycin (CLEOCIN T) 1 % external solution Apply to affected sites on  scalp twice a day until clear, then as needed 30 mL 2   diltiazem (CARDIZEM CD) 240 MG 24 hr capsule Take 1 capsule (240 mg total) by mouth daily. 90 capsule 3   diltiazem (CARDIZEM) 30 MG tablet Take 1 tablet (30 mg total) by mouth daily as needed (SVT). 30 tablet 3   diphenhydrAMINE (BENADRYL) 25 MG tablet As needed for allergies.     diphenoxylate-atropine (LOMOTIL) 2.5-0.025 MG tablet TAKE ONE TABLET BY MOUTH AS NEEDED 30 tablet 2   estradiol (ESTRACE) 0.1 MG/GM vaginal cream PLACE 1/4 APPLICATORFUL VAGINALLY DAILY. 42.5 g 9   fluconazole (DIFLUCAN) 150 MG tablet TAKE 1 TABLET (150 MG TOTAL) BY MOUTH DAILY FOR 2 DAYS 2 tablet 2   hyoscyamine (LEVSIN SL) 0.125 MG SL tablet Place 1 tablet (0.125 mg total) under the tongue every 4 (four) hours as needed for cramping. 60 tablet 5   levothyroxine (SYNTHROID) 25 MCG tablet Take 1 tablet (25 mcg total) by mouth daily before breakfast. 90 tablet 0   omeprazole (PRILOSEC OTC) 20 MG tablet Take 20 mg by mouth daily.     ondansetron (ZOFRAN ODT) 4 MG disintegrating tablet Take 1 tablet (4 mg total) by mouth every 8 (eight) hours as needed for nausea or vomiting. 20 tablet 0   promethazine (PHENERGAN) 12.5 MG tablet Take 1 tablet (12.5 mg total) by mouth every 8 (eight) hours as needed for nausea or  vomiting. 20 tablet 0   rosuvastatin (CRESTOR) 5 MG tablet Take 1 tablet (5 mg total) by mouth daily. 90 tablet 3   Semaglutide,0.25 or 0.5MG /DOS, 2 MG/1.5ML SOPN Inject 0.5 mg into the skin once a week. 9 mL 0   metoprolol succinate (TOPROL-XL) 25 MG 24 hr tablet Take 1 tablet (25 mg total) by mouth 2 (two) times daily. 60 tablet 11   Na Sulfate-K Sulfate-Mg Sulf 17.5-3.13-1.6 GM/177ML SOLN Take both bottles at the times instructed by your provider. (Patient not taking: Reported on 07/04/2022) 354 mL 0   amoxicillin-clavulanate (AUGMENTIN) 875-125 MG tablet Take 1 tablet by mouth 2 (two) times daily. (Patient not taking: Reported on 07/04/2022) 14 tablet 0    benzonatate (TESSALON) 200 MG capsule Take 1 capsule (200 mg total) by mouth 3 (three) times daily as needed for cough. (Patient not taking: Reported on 07/04/2022) 60 capsule 1   chlorpheniramine-HYDROcodone (TUSSIONEX) 10-8 MG/5ML Take 5 mLs by mouth at bedtime as needed for cough. (Patient not taking: Reported on 07/04/2022) 180 mL 0   dicyclomine (BENTYL) 10 MG capsule TAKE 1 CAPSULE BY MOUTH 3 TIMES DAILY BEFORE MEALS. (Patient not taking: Reported on 07/04/2022) 120 capsule 3   diltiazem (CARDIZEM CD) 240 MG 24 hr capsule TAKE 1 CAPSULE BY MOUTH ONCE DAILY (Patient not taking: Reported on 07/04/2022) 90 capsule 3   diltiazem (CARDIZEM CD) 240 MG 24 hr capsule Take 1 capsule (240 mg total) by mouth daily. (Patient not taking: Reported on 07/04/2022) 90 capsule 3   metoprolol succinate (TOPROL-XL) 25 MG 24 hr tablet TAKE 1 TABLET (25 MG TOTAL) BY MOUTH 2 (TWO) TIMES DAILY 60 tablet 11   metoprolol succinate (TOPROL-XL) 25 MG 24 hr tablet TAKE 1 TABLET (25 MG TOTAL) BY MOUTH 2 (TWO) TIMES DAILY (Patient not taking: Reported on 07/04/2022) 60 tablet 11   OZEMPIC, 0.25 OR 0.5 MG/DOSE, 2 MG/3ML SOPN INJECT 0.5 MG INTO THE SKIN ONCE A WEEK. (Patient not taking: Reported on 07/04/2022) 3 mL 2   predniSONE (DELTASONE) 10 MG tablet 6 tablets on Day 1 , then reduce by 1 tablet daily until gone (Patient not taking: Reported on 07/04/2022) 21 tablet 0   vancomycin (VANCOCIN) 125 MG capsule Take 1 capsule (125 mg total) by mouth 4 (four) times daily for 10 days (Patient not taking: Reported on 07/04/2022) 40 capsule 0   No facility-administered medications prior to visit.    Review of Systems;  Patient denies headache, fevers, malaise, unintentional weight loss, skin rash, eye pain, sinus congestion and sinus pain, sore throat, dysphagia,  hemoptysis , cough, dyspnea, wheezing, chest pain, palpitations, orthopnea, edema, abdominal pain, nausea, melena, diarrhea, constipation, flank pain, dysuria, hematuria, urinary   Frequency, nocturia, numbness, tingling, seizures,  Focal weakness, Loss of consciousness,  Tremor, insomnia, depression, anxiety, and suicidal ideation.      Objective:  BP 118/66   Pulse 68   Temp (!) 97.5 F (36.4 C) (Oral)   Ht 5\' 8"  (1.727 m)   Wt 279 lb 9.6 oz (126.8 kg)   SpO2 97%   BMI 42.51 kg/m   BP Readings from Last 3 Encounters:  07/04/22 118/66  06/28/21 132/80  12/28/20 95/61    Wt Readings from Last 3 Encounters:  07/04/22 279 lb 9.6 oz (126.8 kg)  06/02/22 278 lb (126.1 kg)  06/28/21 278 lb (126.1 kg)    Physical Exam Vitals reviewed.  Constitutional:      General: She is not in acute distress.  Appearance: Normal appearance. She is normal weight. She is not ill-appearing, toxic-appearing or diaphoretic.  HENT:     Head: Normocephalic.  Eyes:     General: No scleral icterus.       Right eye: No discharge.        Left eye: No discharge.     Conjunctiva/sclera: Conjunctivae normal.  Cardiovascular:     Rate and Rhythm: Normal rate and regular rhythm.     Heart sounds: Normal heart sounds.  Pulmonary:     Effort: Pulmonary effort is normal. No respiratory distress.     Breath sounds: Normal breath sounds.  Musculoskeletal:        General: Normal range of motion.  Skin:    General: Skin is warm and dry.  Neurological:     General: No focal deficit present.     Mental Status: She is alert and oriented to person, place, and time. Mental status is at baseline.  Psychiatric:        Mood and Affect: Mood normal.        Behavior: Behavior normal.        Thought Content: Thought content normal.        Judgment: Judgment normal.    Lab Results  Component Value Date   HGBA1C 6.2 (A) 07/04/2022   HGBA1C 6.0 07/22/2021   HGBA1C 6.1 06/22/2021    Lab Results  Component Value Date   CREATININE 1.10 (H) 10/28/2021   CREATININE 0.88 07/22/2021   CREATININE 0.70 06/22/2021    Lab Results  Component Value Date   WBC 8.5 08/22/2019   HGB 13.5  08/22/2019   HCT 40.5 08/22/2019   PLT 231.0 08/22/2019   GLUCOSE 142 (H) 07/22/2021   CHOL 155 07/22/2021   TRIG 107.0 07/22/2021   HDL 47.30 07/22/2021   LDLDIRECT 146 (H) 05/24/2019   LDLCALC 86 07/22/2021   ALT 10 07/22/2021   AST 15 07/22/2021   NA 140 07/22/2021   K 4.1 07/22/2021   CL 104 07/22/2021   CREATININE 1.10 (H) 10/28/2021   BUN 14 07/22/2021   CO2 29 07/22/2021   TSH 4.04 07/22/2021   INR 1.1 02/13/2014   HGBA1C 6.2 (A) 07/04/2022   MICROALBUR <0.7 07/22/2021    CT ABDOMEN PELVIS W CONTRAST  Result Date: 10/28/2021 CLINICAL DATA:  Patient complains of episode about 3 weeks ago of 4 days of diarrhea and low-grade fever. She reports that then resolved. Now complains of episode 5 days ago of diarrhea with lower abdominal pain and fever. History of ischemic colitis. No history of cancer. EXAM: CT ABDOMEN AND PELVIS WITH CONTRAST TECHNIQUE: Multidetector CT imaging of the abdomen and pelvis was performed using the standard protocol following bolus administration of intravenous contrast. RADIATION DOSE REDUCTION: This exam was performed according to the departmental dose-optimization program which includes automated exposure control, adjustment of the mA and/or kV according to patient size and/or use of iterative reconstruction technique. CONTRAST:  138mL OMNIPAQUE IOHEXOL 300 MG/ML  SOLN COMPARISON:  CTA abdomen and pelvis 08/22/2019 FINDINGS: Lower chest: No acute abnormality. Coronary artery calcifications are noted. Hepatobiliary: There is diffuse decreased density throughout the liver suggesting fatty infiltration, similar to prior. Smooth liver contours. No focal liver lesion is seen. The gallbladder is unremarkable. No intrahepatic or extrahepatic biliary ductal dilatation. Pancreas: Mild pancreatic atrophy is similar to prior. No inflammatory change is seen. Spleen: Normal in size without focal abnormality. Adrenals/Urinary Tract: Normal adrenals. The kidneys enhance  uniformly and are symmetric in size  without hydronephrosis. 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 This was better seen on prior CTA abdomen 08/22/2019 and reportedly also stable from more remote 2015 CT as well. The bilateral ureters are normal in caliber. No focal urinary bladder wall thickening. Stomach/Bowel: Mild sigmoid diverticulosis. No surrounding inflammatory fat stranding to indicate acute diverticulitis. There is underdistention of the proximal sigmoid colon and distal descending colon. The terminal ileum is unremarkable. Normal appendix (axial image 66 and coronal images 71 through 85). Oral contrast is seen as distal as the ascending colon. No dilated loops of bowel to indicate bowel obstruction. Vascular/Lymphatic: No abdominal aortic aneurysm. Mild atherosclerotic calcifications. No mesenteric retroperitoneal, or pelvic lymphadenopathy. Reproductive: The uterus is present.  No gross adnexal abnormality. Other: No free air or free fluid is seen within the abdomen or pelvis. No ventral abdominal wall hernia. Musculoskeletal: Mild multilevel degenerative disc changes of the lumbar spine. Diffuse anterior bridging osteophytes throughout the T8-9 through T11-12 levels. A sclerotic focus within the posterior left L3 vertebral body is unchanged from prior likely a benign bone island. 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 IMPRESSION: 1. Redemonstration of mild sigmoid colon diverticulosis without inflammatory changes to indicate acute diverticulitis. 2. Fatty infiltration of the liver. Electronically Signed   By: Yvonne Kendall M.D.   On: 10/28/2021 14:19    Assessment & Plan:  .Obesity, diabetes, and hypertension syndrome ([place]) Assessment & Plan: Well controlled M by today's A1c.  Continue ozempic at lowest  dose.  She has documented intolerance to metformin.  Given her frequent UTI's,  SGLT 2 inhibitor would likely not be tolerated   Lab Results  Component Value Date   HGBA1C 6.2 (A) 07/04/2022     Orders: -     Comprehensive metabolic panel; Future -     Microalbumin / creatinine urine ratio; Future -     POCT glycosylated hemoglobin (Hb A1C)  Acquired hypothyroidism  Hyperlipidemia, unspecified hyperlipidemia type -     Lipid panel; Future -     LDL cholesterol, direct; Future  Other fatigue -     CBC with Differential/Platelet; Future -     Cortisol-am, blood; Future -     Thyroglobulin antibody; Future -     Thyroid peroxidase antibody; Future -     Thyroglobulin Level; Future -     Thyroid Panel With TSH; Future  Colitis -     C-reactive protein; Future  SVT (supraventricular tachycardia) Assessment & Plan: On dual therapy for years , now with hypotension since losing 10% of body weight.  Reduce metoprolol succinate to 25 mg daily or in divided doses.    Orders: -     Metoprolol Succinate ER; Take 1 tablet (25 mg total) by mouth daily.  Dispense: 90 tablet; Refill: 1  Morbid obesity ([place]) Assessment & Plan: Continue lower dose of ozempic (0.25 mg weekly) if tolerated    History of ischemic colitis Assessment & Plan: Diagnosed by flex sig in 2015 during hospitalization,  repeat episode presumed Jan 2024.  For colonoscopy May8    Hypotension, unspecified hypotension type Assessment & Plan: REduce metoprolol.  Chack AM cortisol , thyroid panel and thyroid antibiodies       I provided 40 minutes of face-to-face time during this encounter reviewing patient's last visit with me, patient's  most recent visit with  GI,  last several GI procedures,  last visit with cardiology,  previous  labs and imaging studies, counseling on currently addressed issues,  and post visit ordering to diagnostics and therapeutics .   Follow-up: Return in about 3 months (around  10/03/2022) for follow up diabetes.   Crecencio Mc, MD

## 2022-07-04 NOTE — Patient Instructions (Addendum)
Reduce metoprolol dose to 25 mg DAILY  REDUCE OZEMPIC TO 0.25 mg weekly  A1c is 6.2  excellent!  I agree with  a BFL.  Try benefiber since it has a prebiotic

## 2022-07-04 NOTE — Assessment & Plan Note (Signed)
Well controlled M by today's A1c.  Continue ozempic at lowest dose.  She has documented intolerance to metformin.  Given her frequent UTI's,  SGLT 2 inhibitor would likely not be tolerated   Lab Results  Component Value Date   HGBA1C 6.2 (A) 07/04/2022

## 2022-07-04 NOTE — Assessment & Plan Note (Signed)
Continue lower dose of ozempic (0.25 mg weekly) if tolerated

## 2022-07-04 NOTE — Assessment & Plan Note (Signed)
On dual therapy for years , now with hypotension since losing 10% of body weight.  Reduce metoprolol succinate to 25 mg daily or in divided doses.

## 2022-07-04 NOTE — Assessment & Plan Note (Signed)
REduce metoprolol.  Chack AM cortisol , thyroid panel and thyroid antibiodies

## 2022-07-11 ENCOUNTER — Other Ambulatory Visit (INDEPENDENT_AMBULATORY_CARE_PROVIDER_SITE_OTHER): Payer: Medicare Other

## 2022-07-11 DIAGNOSIS — K529 Noninfective gastroenteritis and colitis, unspecified: Secondary | ICD-10-CM | POA: Diagnosis not present

## 2022-07-11 DIAGNOSIS — I152 Hypertension secondary to endocrine disorders: Secondary | ICD-10-CM

## 2022-07-11 DIAGNOSIS — E669 Obesity, unspecified: Secondary | ICD-10-CM | POA: Diagnosis not present

## 2022-07-11 DIAGNOSIS — E1169 Type 2 diabetes mellitus with other specified complication: Secondary | ICD-10-CM

## 2022-07-11 DIAGNOSIS — E1159 Type 2 diabetes mellitus with other circulatory complications: Secondary | ICD-10-CM

## 2022-07-11 DIAGNOSIS — E785 Hyperlipidemia, unspecified: Secondary | ICD-10-CM | POA: Diagnosis not present

## 2022-07-11 DIAGNOSIS — R5383 Other fatigue: Secondary | ICD-10-CM

## 2022-07-11 LAB — COMPREHENSIVE METABOLIC PANEL
ALT: 13 U/L (ref 0–35)
AST: 14 U/L (ref 0–37)
Albumin: 3.7 g/dL (ref 3.5–5.2)
Alkaline Phosphatase: 71 U/L (ref 39–117)
BUN: 16 mg/dL (ref 6–23)
CO2: 25 mEq/L (ref 19–32)
Calcium: 8.6 mg/dL (ref 8.4–10.5)
Chloride: 103 mEq/L (ref 96–112)
Creatinine, Ser: 0.9 mg/dL (ref 0.40–1.20)
GFR: 65.59 mL/min (ref >60.00)
Glucose, Bld: 141 mg/dL — ABNORMAL HIGH (ref 70–99)
Potassium: 4 mEq/L (ref 3.5–5.1)
Sodium: 141 mEq/L (ref 135–145)
Total Bilirubin: 0.5 mg/dL (ref 0.2–1.2)
Total Protein: 6 g/dL (ref 6.0–8.3)

## 2022-07-11 LAB — CBC WITH DIFFERENTIAL/PLATELET
Basophils Absolute: 0.1 10*3/uL (ref 0.0–0.1)
Basophils Relative: 0.6 % (ref 0.0–3.0)
Eosinophils Absolute: 0.2 10*3/uL (ref 0.0–0.7)
Eosinophils Relative: 1.6 % (ref 0.0–5.0)
HCT: 41 % (ref 36.0–46.0)
Hemoglobin: 13.7 g/dL (ref 12.0–15.0)
Lymphocytes Relative: 25 % (ref 12.0–46.0)
Lymphs Abs: 2.5 10*3/uL (ref 0.7–4.0)
MCHC: 33.4 g/dL (ref 30.0–36.0)
MCV: 81.3 fl (ref 78.0–100.0)
Monocytes Absolute: 0.7 10*3/uL (ref 0.1–1.0)
Monocytes Relative: 6.7 % (ref 3.0–12.0)
Neutro Abs: 6.7 10*3/uL (ref 1.4–7.7)
Neutrophils Relative %: 66.1 % (ref 43.0–77.0)
Platelets: 232 10*3/uL (ref 150.0–400.0)
RBC: 5.04 Mil/uL (ref 3.87–5.11)
RDW: 14.5 % (ref 11.5–15.5)
WBC: 10.1 10*3/uL (ref 4.0–10.5)

## 2022-07-11 LAB — LIPID PANEL
Cholesterol: 138 mg/dL (ref 0–200)
HDL: 40.2 mg/dL (ref >39.00)
LDL Cholesterol: 60 mg/dL (ref 0–99)
NonHDL: 97.89
Total CHOL/HDL Ratio: 3
Triglycerides: 188 mg/dL — ABNORMAL HIGH (ref 0.0–149.0)
VLDL: 37.6 mg/dL (ref 0.0–40.0)

## 2022-07-11 LAB — LDL CHOLESTEROL, DIRECT: Direct LDL: 74 mg/dL

## 2022-07-11 LAB — C-REACTIVE PROTEIN: CRP: 1.3 mg/dL (ref 0.5–20.0)

## 2022-07-11 LAB — MICROALBUMIN / CREATININE URINE RATIO
Creatinine,U: 184.7 mg/dL
Microalb Creat Ratio: 0.4 mg/g (ref 0.0–30.0)
Microalb, Ur: 0.7 mg/dL (ref 0.0–1.9)

## 2022-07-13 ENCOUNTER — Encounter: Payer: Self-pay | Admitting: Internal Medicine

## 2022-07-13 LAB — THYROID PANEL WITH TSH
Free Thyroxine Index: 2.4 (ref 1.4–3.8)
T3 Uptake: 28 % (ref 22–35)
T4, Total: 8.4 ug/dL (ref 5.1–11.9)
TSH: 5.03 mIU/L — ABNORMAL HIGH (ref 0.40–4.50)

## 2022-07-13 LAB — CORTISOL-AM, BLOOD: Cortisol - AM: 13.5 ug/dL

## 2022-07-13 LAB — THYROGLOBULIN LEVEL: Thyroglobulin: 17.6 ng/mL

## 2022-07-13 LAB — THYROGLOBULIN ANTIBODY: Thyroglobulin Ab: <1 IU/mL (ref <=1)

## 2022-07-13 LAB — THYROID PEROXIDASE ANTIBODY: Thyroperoxidase Ab SerPl-aCnc: 3 IU/mL (ref <9)

## 2022-07-17 ENCOUNTER — Other Ambulatory Visit: Payer: Self-pay | Admitting: Internal Medicine

## 2022-07-17 MED ORDER — LEVOTHYROXINE SODIUM 50 MCG PO TABS: 50.0000 ug | ORAL_TABLET | Freq: Every day | ORAL | 0 refills | Status: DC

## 2022-08-16 ENCOUNTER — Other Ambulatory Visit: Payer: Self-pay | Admitting: Internal Medicine

## 2022-08-16 MED FILL — Estradiol Vaginal Cream 0.1 MG/GM: VAGINAL | 90 days supply | Qty: 42.5 | Fill #1 | Status: AC

## 2022-08-17 ENCOUNTER — Other Ambulatory Visit: Payer: Self-pay

## 2022-08-17 MED ORDER — ONDANSETRON 4 MG PO TBDP
4.0000 mg | ORAL_TABLET | Freq: Three times a day (TID) | ORAL | 0 refills | Status: DC | PRN
  Filled 2022-08-17: qty 20, 7d supply, fill #0

## 2022-08-18 ENCOUNTER — Other Ambulatory Visit: Payer: Self-pay | Admitting: Internal Medicine

## 2022-08-18 ENCOUNTER — Other Ambulatory Visit: Payer: Self-pay

## 2022-08-18 MED ORDER — SEMAGLUTIDE(0.25 OR 0.5MG/DOS) 2 MG/3ML ~~LOC~~ SOPN
0.2500 mg | PEN_INJECTOR | SUBCUTANEOUS | 0 refills | Status: DC
  Filled 2022-08-18: qty 3, 56d supply, fill #0
  Filled 2022-08-25: qty 3, 28d supply, fill #0
  Filled 2022-08-26: qty 3, 56d supply, fill #0
  Filled 2022-11-22: qty 3, 56d supply, fill #1
  Filled 2022-12-24: qty 3, 56d supply, fill #2

## 2022-08-19 ENCOUNTER — Other Ambulatory Visit: Payer: Self-pay

## 2022-08-22 ENCOUNTER — Other Ambulatory Visit: Payer: Self-pay

## 2022-08-23 ENCOUNTER — Encounter: Payer: Self-pay | Admitting: Internal Medicine

## 2022-08-24 ENCOUNTER — Ambulatory Visit: Payer: Medicare Other | Admitting: Anesthesiology

## 2022-08-24 ENCOUNTER — Encounter
Admission: RE | Disposition: A | Discharge status: Home / Self Care | Payer: Self-pay | Source: Home / Self Care | Attending: Internal Medicine

## 2022-08-24 ENCOUNTER — Ambulatory Visit
Admission: RE | Admit: 2022-08-24 | Discharge: 2022-08-24 | Disposition: A | Discharge status: Home / Self Care | Payer: Medicare Other | Attending: Internal Medicine | Admitting: Internal Medicine

## 2022-08-24 DIAGNOSIS — K640 First degree hemorrhoids: Secondary | ICD-10-CM | POA: Insufficient documentation

## 2022-08-24 DIAGNOSIS — K589 Irritable bowel syndrome without diarrhea: Secondary | ICD-10-CM | POA: Insufficient documentation

## 2022-08-24 DIAGNOSIS — E119 Type 2 diabetes mellitus without complications: Secondary | ICD-10-CM | POA: Insufficient documentation

## 2022-08-24 DIAGNOSIS — K921 Melena: Secondary | ICD-10-CM | POA: Diagnosis not present

## 2022-08-24 DIAGNOSIS — I4891 Unspecified atrial fibrillation: Secondary | ICD-10-CM | POA: Diagnosis not present

## 2022-08-24 DIAGNOSIS — I10 Essential (primary) hypertension: Secondary | ICD-10-CM | POA: Insufficient documentation

## 2022-08-24 DIAGNOSIS — Z86718 Personal history of other venous thrombosis and embolism: Secondary | ICD-10-CM | POA: Diagnosis not present

## 2022-08-24 DIAGNOSIS — R194 Change in bowel habit: Secondary | ICD-10-CM | POA: Diagnosis present

## 2022-08-24 DIAGNOSIS — Z86711 Personal history of pulmonary embolism: Secondary | ICD-10-CM | POA: Diagnosis not present

## 2022-08-24 DIAGNOSIS — E039 Hypothyroidism, unspecified: Secondary | ICD-10-CM | POA: Insufficient documentation

## 2022-08-24 DIAGNOSIS — G709 Myoneural disorder, unspecified: Secondary | ICD-10-CM | POA: Insufficient documentation

## 2022-08-24 HISTORY — PX: COLONOSCOPY: SHX5424

## 2022-08-24 LAB — GLUCOSE, CAPILLARY: Glucose-Capillary: 158 mg/dL — ABNORMAL HIGH (ref 70–99)

## 2022-08-24 SURGERY — COLONOSCOPY
Anesthesia: General

## 2022-08-24 MED ORDER — LIDOCAINE HCL (CARDIAC) PF 100 MG/5ML IV SOSY
PREFILLED_SYRINGE | INTRAVENOUS | Status: DC | PRN
  Administered 2022-08-24: 100 mg via INTRAVENOUS

## 2022-08-24 MED ORDER — PROPOFOL 10 MG/ML IV BOLUS
INTRAVENOUS | Status: AC
  Filled 2022-08-24: qty 40

## 2022-08-24 MED ORDER — PROPOFOL 10 MG/ML IV BOLUS
INTRAVENOUS | Status: DC | PRN
  Administered 2022-08-24: 100 mg via INTRAVENOUS
  Administered 2022-08-24: 100 ug/kg/min via INTRAVENOUS

## 2022-08-24 MED ORDER — SODIUM CHLORIDE 0.9 % IV SOLN
INTRAVENOUS | Status: DC
  Administered 2022-08-24: 20 mL/h via INTRAVENOUS

## 2022-08-24 MED ORDER — LIDOCAINE HCL (PF) 2 % IJ SOLN
INTRAMUSCULAR | Status: AC
  Filled 2022-08-24: qty 5

## 2022-08-24 NOTE — Interval H&P Note (Signed)
History and Physical Interval Note:  08/24/2022 9:07 AM  Tiffany Wells  has presented today for surgery, with the diagnosis of 578.1 (ICD-9-CM) - K92.1 (ICD-10-CM) - Hematochezia 787.99 (ICD-9-CM) - R19.4 (ICD-10-CM) - Change in bowel habits V12.79 (ICD-9-CM) - Z87.19 (ICD-10-CM) - History of ischemic colitis.  The various methods of treatment have been discussed with the patient and family. After consideration of risks, benefits and other options for treatment, the patient has consented to  Procedure(s): COLONOSCOPY (N/A) as a surgical intervention.  The patient's history has been reviewed, patient examined, no change in status, stable for surgery.  I have reviewed the patient's chart and labs.  Questions were answered to the patient's satisfaction.     [place], [place]

## 2022-08-24 NOTE — Anesthesia Postprocedure Evaluation (Signed)
Anesthesia Post Note  Patient: Tiffany Wells  Procedure(s) Performed: COLONOSCOPY  Patient location during evaluation: Endoscopy Anesthesia Type: General Level of consciousness: awake and alert Pain management: pain level controlled Vital Signs Assessment: post-procedure vital signs reviewed and stable Respiratory status: spontaneous breathing, nonlabored ventilation, respiratory function stable and patient connected to nasal cannula oxygen Cardiovascular status: blood pressure returned to baseline and stable Postop Assessment: no apparent nausea or vomiting Anesthetic complications: no   No notable events documented.   Last Vitals:  Vitals:   08/24/22 0945 08/24/22 0955  BP: (!) 119/49 109/62  Pulse: (!) 106   Resp: 18   Temp: (!) 36.2 C   SpO2: 95%     Last Pain:  Vitals:   08/24/22 1005  TempSrc:   PainSc: (P) 0-No pain                 Cleda Mccreedy Bryley Chrisman

## 2022-08-24 NOTE — Transfer of Care (Signed)
Immediate Anesthesia Transfer of Care Note  Patient: Tiffany Wells  Procedure(s) Performed: COLONOSCOPY  Patient Location: Endoscopy Unit  Anesthesia Type:General  Level of Consciousness: awake and drowsy  Airway & Oxygen Therapy: Patient Spontanous Breathing  Post-op Assessment: Report given to RN and Post -op Vital signs reviewed and stable  Post vital signs: Reviewed and stable  Last Vitals:  Vitals Value Taken Time  BP 119/49 08/24/22 0946  Temp 35.8 0946  Pulse 104 08/24/22 0946  Resp 24 08/24/22 0946  SpO2 94 % 08/24/22 0946  Vitals shown include unvalidated device data.  Last Pain:  Vitals:   08/24/22 0804  TempSrc: Temporal  PainSc: 0-No pain         Complications: No notable events documented.

## 2022-08-24 NOTE — H&P (Signed)
Outpatient short stay form Pre-procedure 08/24/2022 9:06 AM Tiffany Wells K. Tiffany Wells, M.D.  Primary Physician: Duncan Dull, M.D.  Reason for visit:  Hematochezia, hx of ischemic colitis, cahnge in bowel habits.  History of present illness:  Tiffany Wells is a pleasant 67 year old female with a history of diarrhea predominant irritable bowel syndrome and ischemic colitis who presents 5 days after returning from vacation in [place] with her husband, Dr. Inocente Salles. Patient says that she had significant nausea after eating some coconut shrimp and other food. This seemed to result in some nausea abdominal cramping and diarrhea with a scant amount of bright red blood per rectum. This happened several more times up until 3 days ago. Patient says that her diarrhea for the most part had resolved over the last 24 to 48 hours. She denies any fever, vomiting, hematemesis or syncope. She does take antispasmodic dicyclomine as well as Zofran for nausea. These seem to help her symptoms when she was on vacation and currently she is symptom-free. Her last colonoscopy was performed in November 2018 by Dr. Lynnae Prude revealing internal hemorrhoids but was otherwise normal. Flexible sigmoidoscopy October 2015, show below revealed changes consistent with ischemic colitis that was confirmed by biopsy specimen.  Colonoscopy report from 11/91/2012 showed small hemorrhoids.  - Flex sig 02/13/2014 showed patchy severe ischemic colitis in the sigmoid. Biopsies revealed colonic mucosa with changes consistent with ischemic colitis negative for dysplasia malignancy. -04/22/2016 colonoscopy: Indic: Heme pos stool: Findings: IH small, medium-sized and Grade I (internal hemorrhoids that do not  prolapse). The exam was otherwise without abnormality.Impression: - Internal hemorrhoids     Current Facility-Administered Medications:    0.9 %  sodium chloride infusion, , Intravenous, Continuous, Ehtan Delfavero, Boykin Nearing, MD, Last Rate: 20  mL/hr at 08/24/22 0819, 20 mL/hr at 08/24/22 0819  Medications Prior to Admission  Medication Sig Dispense Refill Last Dose   diltiazem (CARDIZEM CD) 240 MG 24 hr capsule Take 1 capsule (240 mg total) by mouth daily. 90 capsule 3 08/24/2022   diltiazem (CARDIZEM) 30 MG tablet Take 1 tablet (30 mg total) by mouth daily as needed (SVT). 30 tablet 3 08/24/2022 at 0615   diphenhydrAMINE (BENADRYL) 25 MG tablet As needed for allergies.   Past Month   diphenoxylate-atropine (LOMOTIL) 2.5-0.025 MG tablet TAKE ONE TABLET BY MOUTH AS NEEDED 30 tablet 2 Past Week   estradiol (ESTRACE) 0.1 MG/GM vaginal cream PLACE 1/4 APPLICATORFUL VAGINALLY DAILY. 42.5 g 9 08/23/2022   fluconazole (DIFLUCAN) 150 MG tablet TAKE 1 TABLET (150 MG TOTAL) BY MOUTH DAILY FOR 2 DAYS 2 tablet 2 Past Week   hyoscyamine (LEVSIN SL) 0.125 MG SL tablet Place 1 tablet (0.125 mg total) under the tongue every 4 (four) hours as needed for cramping. 60 tablet 5 Past Week   levothyroxine (SYNTHROID) 50 MCG tablet Take 1 tablet (50 mcg total) by mouth daily before breakfast. 90 tablet 0 08/24/2022 at 0615   metoprolol succinate (TOPROL-XL) 25 MG 24 hr tablet Take 1 tablet (25 mg total) by mouth daily. 90 tablet 1 08/23/2022   omeprazole (PRILOSEC OTC) 20 MG tablet Take 20 mg by mouth daily.   08/23/2022   ondansetron (ZOFRAN-ODT) 4 MG disintegrating tablet Take 1 tablet (4 mg total) by mouth every 8 (eight) hours as needed for nausea or vomiting. 20 tablet 0 Past Week   promethazine (PHENERGAN) 12.5 MG tablet Take 1 tablet (12.5 mg total) by mouth every 8 (eight) hours as needed for nausea or vomiting. 20 tablet 0 Past  Week   rosuvastatin (CRESTOR) 5 MG tablet Take 1 tablet (5 mg total) by mouth daily. 90 tablet 3 08/23/2022   Semaglutide,0.25 or 0.5MG /DOS, 2 MG/3ML SOPN Inject 0.25 mg into the skin once a week. 9 mL 0 Past Month   clindamycin (CLEOCIN T) 1 % external solution Apply to affected sites on scalp twice a day until clear, then as needed  (Patient not taking: Reported on 08/24/2022) 30 mL 2 Completed Course   Na Sulfate-K Sulfate-Mg Sulf 17.5-3.13-1.6 GM/177ML SOLN Take both bottles at the times instructed by your provider. (Patient not taking: Reported on 07/04/2022) 354 mL 0      Allergies  Allergen Reactions   Codeine Itching    REACTION: Itching   Codeine Sulfate     REACTION: Itching   Glucophage Xr [Metformin]     Uncontrolled diarrhea   Sulfa Antibiotics     Facial burning   Tape Rash    Adhesive tape, silicones     Past Medical History:  Diagnosis Date   Acute pulmonary embolism (HCC) 08/06/2013   Diagnosed during hospitalization for TKR.  Patient received heparin followed by Xarelto whic hwill continue for a total of 6 months.   [facility] has recommended IVC placement prior to next TKR and bridging with Lovenox (June 17  [place])     Arthritis    osteoarthritis of knee   Back pain    Diabetes mellitus without complication (HCC)    DVT (deep venous thrombosis) (HCC) 12/28/2016   Dysrhythmia 12/28/2016   atrial fibrillation, SVT   Headache    Migraines   History of Salmonella gastroenteritis 2012   Hypertension    IBS (irritable bowel syndrome)    Internal hemorrhoids    Ischemic colitis (HCC) 02/21/2014   Normal cardiac stress test    2006, nromal stress cardiolite, EF 65%   Pulmonary embolism (HCC)    post knee surgery    Review of systems:  Otherwise negative.    Physical Exam  Gen: Alert, oriented. Appears stated age.  HEENT: [place]/AT. PERRLA. Lungs: CTA, no wheezes. CV: RR nl S1, S2. Abd: soft, benign, no masses. BS+ Ext: No edema. Pulses 2+    Planned procedures: Proceed with colonoscopy. The patient understands the nature of the planned procedure, indications, risks, alternatives and potential complications including but not limited to bleeding, infection, perforation, damage to internal organs and possible oversedation/side effects from anesthesia. The patient agrees and gives consent to  proceed.  Please refer to procedure notes for findings, recommendations and patient disposition/instructions.     Tiffany Wells K. Tiffany Wells, M.D. Gastroenterology 08/24/2022  9:06 AM

## 2022-08-24 NOTE — Anesthesia Preprocedure Evaluation (Addendum)
Anesthesia Evaluation  Patient identified by MRN, date of birth, ID band Patient awake    Reviewed: Allergy & Precautions, NPO status , Patient's Chart, lab work & pertinent test results  History of Anesthesia Complications Negative for: history of anesthetic complications  Airway Mallampati: III  TM Distance: <3 FB Neck ROM: full    Dental  (+) Chipped   Pulmonary neg pulmonary ROS, neg shortness of breath   Pulmonary exam normal        Cardiovascular Exercise Tolerance: Good hypertension, (-) DOE Normal cardiovascular exam+ dysrhythmias      Neuro/Psych  Headaches  Neuromuscular disease  negative psych ROS   GI/Hepatic Neg liver ROS, hiatal hernia,,,  Endo/Other  diabetes, Type 2Hypothyroidism    Renal/GU negative Renal ROS  negative genitourinary   Musculoskeletal   Abdominal   Peds  Hematology negative hematology ROS (+)   Anesthesia Other Findings Past Medical History: 08/06/2013: Acute pulmonary embolism (HCC)     Comment:  Diagnosed during hospitalization for TKR.  Patient               received heparin followed by Xarelto whic hwill continue               for a total of 6 months.   [facility] has recommended IVC               placement prior to next TKR and bridging with Lovenox               (June 17  Schnier)   No date: Arthritis     Comment:  osteoarthritis of knee No date: Back pain No date: Diabetes mellitus without complication (HCC) 12/28/2016: DVT (deep venous thrombosis) (HCC) 12/28/2016: Dysrhythmia     Comment:  atrial fibrillation, SVT No date: Headache     Comment:  Migraines 2012: History of Salmonella gastroenteritis No date: Hypertension No date: IBS (irritable bowel syndrome) No date: Internal hemorrhoids 02/21/2014: Ischemic colitis (HCC) No date: Normal cardiac stress test     Comment:  2006, nromal stress cardiolite, EF 65% No date: Pulmonary embolism (HCC)     Comment:  post knee  surgery  Past Surgical History: 03/07/2011: COLONOSCOPY     Comment:  Int Hemms 02/20/2017: COLONOSCOPY WITH PROPOFOL; N/A     Comment:  Procedure: COLONOSCOPY WITH PROPOFOL;  Surgeon: Scot Jun, MD;  Location: [facility] ENDOSCOPY;  Service:               Endoscopy;  Laterality: N/A; No date: HYSTEROSCOPY No date: JOINT REPLACEMENT     Comment:  Bilateral TKR No date: ROTATOR CUFF REPAIR No date: TUBAL LIGATION 03/07/2011: UPPER GASTROINTESTINAL ENDOSCOPY     Comment:  HH Multip Gastric Polyps/ Dr Mechele Collin  BMI    Body Mass Index: 42.09 kg/m      Reproductive/Obstetrics negative OB ROS                             Anesthesia Physical Anesthesia Plan  ASA: 2  Anesthesia Plan: General   Post-op Pain Management:    Induction: Intravenous  PONV Risk Score and Plan: Propofol infusion and TIVA  Airway Management Planned: Natural Airway and Nasal Cannula  Additional Equipment:   Intra-op Plan:   Post-operative Plan:   Informed Consent: I have reviewed the patients History and Physical, chart, labs and discussed the procedure including  the risks, benefits and alternatives for the proposed anesthesia with the patient or authorized representative who has indicated his/her understanding and acceptance.     Dental Advisory Given  Plan Discussed with: Anesthesiologist, CRNA and Surgeon  Anesthesia Plan Comments: (Patient consented for risks of anesthesia including but not limited to:  - adverse reactions to medications - risk of airway placement if required - damage to eyes, teeth, lips or other oral mucosa - nerve damage due to positioning  - sore throat or hoarseness - Damage to heart, brain, nerves, lungs, other parts of body or loss of life  Patient voiced understanding.)       Anesthesia Quick Evaluation

## 2022-08-24 NOTE — Op Note (Addendum)
[facility] Gastroenterology Patient Name: Tiffany Wells Procedure Date: 08/24/2022 9:14 AM MRN: [id number] Account #: [id number] Date of Birth: 08-07-1955 Admit Type: Outpatient Age: 67 Room: [facility] ENDO ROOM 2 Gender: Female Note Status: Finalized Instrument Name: Nelda Marseille [id number] Procedure:             Colonoscopy Indications:           Hematochezia, Change in bowel habits, history of                         ischemic colitis. Providers:             Boykin Nearing. Paislea Hatton MD, MD Medicines:             Propofol per Anesthesia Complications:         No immediate complications. Procedure:             Pre-Anesthesia Assessment:                        - The risks and benefits of the procedure and the                         sedation options and risks were discussed with the                         patient. All questions were answered and informed                         consent was obtained.                        - Patient identification and proposed procedure were                         verified prior to the procedure by the nurse. The                         procedure was verified in the procedure room.                        - ASA Grade Assessment: III - A patient with severe                         systemic disease.                        - After reviewing the risks and benefits, the patient                         was deemed in satisfactory condition to undergo the                         procedure.                        After obtaining informed consent, the colonoscope was                         passed under direct vision. Throughout the procedure,  the patient's blood pressure, pulse, and oxygen                         saturations were monitored continuously. The                         Colonoscope was introduced through the anus and                         advanced to the the cecum, identified by appendiceal                          orifice and ileocecal valve. The colonoscopy was                         somewhat difficult due to restricted mobility of the                         colon. Successful completion of the procedure was                         aided by withdrawing the scope and replacing with the                         pediatric colonoscope. The patient tolerated the                         procedure well. The quality of the bowel preparation                         was adequate. The ileocecal valve, appendiceal                         orifice, and rectum were photographed. Findings:      The perianal and digital rectal examinations were normal. Pertinent       negatives include normal sphincter tone and no palpable rectal lesions.      Non-bleeding internal hemorrhoids were found during retroflexion. The       hemorrhoids were Grade I (internal hemorrhoids that do not prolapse).      The exam was otherwise without abnormality.      Normal mucosa was found in the entire colon. Impression:            - Non-bleeding internal hemorrhoids.                        - The examination was otherwise normal.                        - Normal mucosa in the entire examined colon.                        - No specimens collected. Recommendation:        - Patient has a contact number available for                         emergencies. The signs and symptoms of potential  delayed complications were discussed with the patient.                         Return to normal activities tomorrow. Written                         discharge instructions were provided to the patient.                        - Continue present medications.                        - Repeat colonoscopy in 10 years for screening                         purposes.                        - Return to GI office PRN.                        - The findings and recommendations were discussed with                         the patient.                         - High fiber diet. Procedure Code(s):     --- Professional ---                        [phone], Colonoscopy, flexible; diagnostic, including                         collection of specimen(s) by brushing or washing, when                         performed (separate procedure) Diagnosis Code(s):     --- Professional ---                        R19.4, Change in bowel habit                        K92.1, Melena (includes Hematochezia)                        K64.0, First degree hemorrhoids CPT copyright 2022 American Medical Association. All rights reserved. The codes documented in this report are preliminary and upon coder review may  be revised to meet current compliance requirements. Stanton Kidney MD, MD 08/24/2022 9:46:01 AM This report has been signed electronically. Number of Addenda: 0 Note Initiated On: 08/24/2022 9:14 AM Scope Withdrawal Time: 0 hours 7 minutes 11 seconds  Total Procedure Duration: 0 hours 21 minutes 9 seconds  Estimated Blood Loss:  Estimated blood loss: none.      [facility]

## 2022-08-25 ENCOUNTER — Encounter: Payer: Self-pay | Admitting: Internal Medicine

## 2022-08-25 ENCOUNTER — Other Ambulatory Visit: Payer: Self-pay

## 2022-08-26 ENCOUNTER — Other Ambulatory Visit: Payer: Self-pay

## 2022-10-04 ENCOUNTER — Encounter: Payer: Self-pay | Admitting: Internal Medicine

## 2022-10-04 ENCOUNTER — Ambulatory Visit (INDEPENDENT_AMBULATORY_CARE_PROVIDER_SITE_OTHER): Payer: Medicare Other | Admitting: Internal Medicine

## 2022-10-04 VITALS — BP 124/70 | HR 98 | Temp 98.1°F | Ht 68.0 in | Wt 279.2 lb

## 2022-10-04 DIAGNOSIS — I471 Supraventricular tachycardia, unspecified: Secondary | ICD-10-CM | POA: Diagnosis not present

## 2022-10-04 DIAGNOSIS — I152 Hypertension secondary to endocrine disorders: Secondary | ICD-10-CM | POA: Diagnosis not present

## 2022-10-04 DIAGNOSIS — E785 Hyperlipidemia, unspecified: Secondary | ICD-10-CM

## 2022-10-04 DIAGNOSIS — E1159 Type 2 diabetes mellitus with other circulatory complications: Secondary | ICD-10-CM | POA: Diagnosis not present

## 2022-10-04 DIAGNOSIS — E669 Obesity, unspecified: Secondary | ICD-10-CM

## 2022-10-04 DIAGNOSIS — Z7985 Long-term (current) use of injectable non-insulin antidiabetic drugs: Secondary | ICD-10-CM | POA: Diagnosis not present

## 2022-10-04 DIAGNOSIS — E1169 Type 2 diabetes mellitus with other specified complication: Secondary | ICD-10-CM

## 2022-10-04 DIAGNOSIS — I959 Hypotension, unspecified: Secondary | ICD-10-CM

## 2022-10-04 DIAGNOSIS — E039 Hypothyroidism, unspecified: Secondary | ICD-10-CM

## 2022-10-04 MED ORDER — METOPROLOL SUCCINATE ER 25 MG PO TB24: 25.0000 mg | ORAL_TABLET | Freq: Every day | ORAL | 1 refills | Status: DC

## 2022-10-04 NOTE — Assessment & Plan Note (Addendum)
Ozempic has been approved through May 2025.  She has lost 30 lbs and currently is taking 0.25 mg dose

## 2022-10-04 NOTE — Assessment & Plan Note (Signed)
Secondary to higher metoprolol dose,   resolved with reduction in dose to 25 mg

## 2022-10-04 NOTE — Progress Notes (Signed)
Subjective:  Patient ID: Tiffany Wells, female    DOB: 1956/01/16  Age: 67 y.o. MRN: [id number]  CC: The primary encounter diagnosis was Obesity, diabetes, and hypertension syndrome (HCC). Diagnoses of SVT (supraventricular tachycardia), Hyperlipidemia, unspecified hyperlipidemia type, Long-term current use of injectable noninsulin antidiabetic medication, Acquired hypothyroidism, and Hypotension, unspecified hypotension type were also pertinent to this visit.   HPI Tiffany Wells presents for  Chief Complaint  Patient presents with   Medical Management of Chronic Issues    3 month follow up    1) Obesity, Diabetes hypertension:  She feels generally well, is walking g several times per week and checking blood sugars once daily at variable times.  BS have been under 130 fasting and < 150 post prandially.  Denies any recent hypoglyemic events.  Taking Ozempic at lowest dose,  trying to gradually increase dose to 0.5 mg but limited by GI symptoms. . Following a carbohydrate modified diet 6 days per week. Denies numbness, burning and tingling of extremities. Appetite is good.  BP improved (less hypotension) with lower dose of metoprolol.     2) SVT:;  has determined that dehydration is her trigger.   Using cardizem prn   3 HM:  mammogram AND mAY 2024 COLONOSCOPY report reviewed.   Outpatient Medications Prior to Visit  Medication Sig Dispense Refill   clindamycin (CLEOCIN T) 1 % external solution Apply to affected sites on scalp twice a day until clear, then as needed 30 mL 2   diltiazem (CARDIZEM CD) 240 MG 24 hr capsule Take 1 capsule (240 mg total) by mouth daily. 90 capsule 3   diltiazem (CARDIZEM) 30 MG tablet Take 1 tablet (30 mg total) by mouth daily as needed (SVT). 30 tablet 3   diphenhydrAMINE (BENADRYL) 25 MG tablet As needed for allergies.     diphenoxylate-atropine (LOMOTIL) 2.5-0.025 MG tablet TAKE ONE TABLET BY MOUTH AS NEEDED 30 tablet 2   estradiol (ESTRACE) 0.1 MG/GM  vaginal cream PLACE 1/4 APPLICATORFUL VAGINALLY DAILY. 42.5 g 9   fluconazole (DIFLUCAN) 150 MG tablet TAKE 1 TABLET (150 MG TOTAL) BY MOUTH DAILY FOR 2 DAYS 2 tablet 2   hyoscyamine (LEVSIN SL) 0.125 MG SL tablet Place 1 tablet (0.125 mg total) under the tongue every 4 (four) hours as needed for cramping. 60 tablet 5   levothyroxine (SYNTHROID) 50 MCG tablet Take 1 tablet (50 mcg total) by mouth daily before breakfast. 90 tablet 0   omeprazole (PRILOSEC OTC) 20 MG tablet Take 20 mg by mouth daily.     ondansetron (ZOFRAN-ODT) 4 MG disintegrating tablet Take 1 tablet (4 mg total) by mouth every 8 (eight) hours as needed for nausea or vomiting. 20 tablet 0   promethazine (PHENERGAN) 12.5 MG tablet Take 1 tablet (12.5 mg total) by mouth every 8 (eight) hours as needed for nausea or vomiting. 20 tablet 0   rosuvastatin (CRESTOR) 5 MG tablet Take 1 tablet (5 mg total) by mouth daily. 90 tablet 3   Semaglutide,0.25 or 0.5MG /DOS, 2 MG/3ML SOPN Inject 0.25 mg into the skin once a week. 9 mL 0   metoprolol succinate (TOPROL-XL) 25 MG 24 hr tablet Take 1 tablet (25 mg total) by mouth daily. 90 tablet 1   Na Sulfate-K Sulfate-Mg Sulf 17.5-3.13-1.6 GM/177ML SOLN Take both bottles at the times instructed by your provider. (Patient not taking: Reported on 07/04/2022) 354 mL 0   No facility-administered medications prior to visit.    Review of Systems;  Patient denies headache,  fevers, malaise, unintentional weight loss, skin rash, eye pain, sinus congestion and sinus pain, sore throat, dysphagia,  hemoptysis , cough, dyspnea, wheezing, chest pain, palpitations, orthopnea, edema, abdominal pain, nausea, melena, diarrhea, constipation, flank pain, dysuria, hematuria, urinary  Frequency, nocturia, numbness, tingling, seizures,  Focal weakness, Loss of consciousness,  Tremor, insomnia, depression, anxiety, and suicidal ideation.      Objective:  BP 124/70   Pulse 98   Temp 98.1 F (36.7 C) (Oral)   Ht 5\' 8"   (1.727 m)   Wt 279 lb 3.2 oz (126.6 kg)   SpO2 99%   BMI 42.45 kg/m   BP Readings from Last 3 Encounters:  10/04/22 124/70  08/24/22 109/62  07/04/22 118/66    Wt Readings from Last 3 Encounters:  10/04/22 279 lb 3.2 oz (126.6 kg)  08/24/22 276 lb 12.8 oz (125.6 kg)  07/04/22 279 lb 9.6 oz (126.8 kg)    Physical Exam Vitals reviewed.  Constitutional:      General: She is not in acute distress.    Appearance: Normal appearance. She is normal weight. She is not ill-appearing, toxic-appearing or diaphoretic.  HENT:     Head: Normocephalic.  Eyes:     General: No scleral icterus.       Right eye: No discharge.        Left eye: No discharge.     Conjunctiva/sclera: Conjunctivae normal.  Cardiovascular:     Rate and Rhythm: Normal rate and regular rhythm.     Heart sounds: Normal heart sounds.  Pulmonary:     Effort: Pulmonary effort is normal. No respiratory distress.     Breath sounds: Normal breath sounds.  Musculoskeletal:        General: Normal range of motion.  Skin:    General: Skin is warm and dry.  Neurological:     General: No focal deficit present.     Mental Status: She is alert and oriented to person, place, and time. Mental status is at baseline.  Psychiatric:        Mood and Affect: Mood normal.        Behavior: Behavior normal.        Thought Content: Thought content normal.        Judgment: Judgment normal.    Lab Results  Component Value Date   HGBA1C 6.2 (A) 07/04/2022   HGBA1C 6.0 07/22/2021   HGBA1C 6.1 06/22/2021    Lab Results  Component Value Date   CREATININE 0.90 07/11/2022   CREATININE 1.10 (H) 10/28/2021   CREATININE 0.88 07/22/2021    Lab Results  Component Value Date   WBC 10.1 07/11/2022   HGB 13.7 07/11/2022   HCT 41.0 07/11/2022   PLT 232.0 07/11/2022   GLUCOSE 141 (H) 07/11/2022   CHOL 138 07/11/2022   TRIG 188.0 (H) 07/11/2022   HDL 40.20 07/11/2022   LDLDIRECT 74.0 07/11/2022   LDLCALC 60 07/11/2022   ALT 13  07/11/2022   AST 14 07/11/2022   NA 141 07/11/2022   K 4.0 07/11/2022   CL 103 07/11/2022   CREATININE 0.90 07/11/2022   BUN 16 07/11/2022   CO2 25 07/11/2022   TSH 5.03 (H) 07/11/2022   INR 1.1 02/13/2014   HGBA1C 6.2 (A) 07/04/2022   MICROALBUR 0.7 07/11/2022    No results found.  Assessment & Plan:  .Obesity, diabetes, and hypertension syndrome (HCC) -     Hemoglobin A1c -     Comprehensive metabolic panel  SVT (supraventricular tachycardia) -  Metoprolol Succinate ER; Take 1 tablet (25 mg total) by mouth daily.  Dispense: 90 tablet; Refill: 1  Hyperlipidemia, unspecified hyperlipidemia type -     Lipid panel -     LDL cholesterol, direct  Long-term current use of injectable noninsulin antidiabetic medication Assessment & Plan: Ozempic has been approved through May 2025.  She has lost 30 lbs and currently is taking 0.25 mg dose    Acquired hypothyroidism -     TSH  Hypotension, unspecified hypotension type Assessment & Plan: Secondary to higher metoprolol dose,   resolved with reduction in dose to 25 mg      Follow-up: Return in about 3 months (around 01/03/2023) for follow up diabetes.   Sherlene Shams, MD

## 2022-10-04 NOTE — Patient Instructions (Addendum)
  You are due for your tetanus-diptheria-pertussis vaccine   (TDaP)  at your pharmacy    [employer] ! Makes a low carb spinach & egg white frittata  (available at [employer] most of the time in the frozen sectio)

## 2022-10-05 ENCOUNTER — Encounter: Payer: Self-pay | Admitting: Internal Medicine

## 2022-10-05 LAB — LIPID PANEL
Cholesterol: 153 mg/dL (ref 0–200)
HDL: 43.5 mg/dL (ref >39.00)
LDL Cholesterol: 86 mg/dL (ref 0–99)
NonHDL: 109.39
Total CHOL/HDL Ratio: 4
Triglycerides: 117 mg/dL (ref 0.0–149.0)
VLDL: 23.4 mg/dL (ref 0.0–40.0)

## 2022-10-05 LAB — COMPREHENSIVE METABOLIC PANEL
ALT: 12 U/L (ref 0–35)
AST: 14 U/L (ref 0–37)
Albumin: 4 g/dL (ref 3.5–5.2)
Alkaline Phosphatase: 87 U/L (ref 39–117)
BUN: 18 mg/dL (ref 6–23)
CO2: 30 mEq/L (ref 19–32)
Calcium: 8.9 mg/dL (ref 8.4–10.5)
Chloride: 102 mEq/L (ref 96–112)
Creatinine, Ser: 0.94 mg/dL (ref 0.40–1.20)
GFR: 62.15 mL/min (ref >60.00)
Glucose, Bld: 127 mg/dL — ABNORMAL HIGH (ref 70–99)
Potassium: 4.3 mEq/L (ref 3.5–5.1)
Sodium: 141 mEq/L (ref 135–145)
Total Bilirubin: 0.4 mg/dL (ref 0.2–1.2)
Total Protein: 6.6 g/dL (ref 6.0–8.3)

## 2022-10-05 LAB — HEMOGLOBIN A1C: Hgb A1c MFr Bld: 6.1 % (ref 4.6–6.5)

## 2022-10-05 LAB — TSH: TSH: 2.69 u[IU]/mL (ref 0.35–5.50)

## 2022-10-05 LAB — LDL CHOLESTEROL, DIRECT: Direct LDL: 100 mg/dL

## 2022-10-06 ENCOUNTER — Other Ambulatory Visit: Payer: Self-pay

## 2022-10-06 MED ORDER — ROSUVASTATIN CALCIUM 10 MG PO TABS
10.0000 mg | ORAL_TABLET | Freq: Every day | ORAL | 1 refills | Status: DC
  Filled 2022-10-06: qty 90, 90d supply, fill #0
  Filled 2023-01-29: qty 90, 90d supply, fill #1

## 2022-10-10 ENCOUNTER — Other Ambulatory Visit: Payer: Self-pay

## 2022-10-13 ENCOUNTER — Other Ambulatory Visit: Payer: Self-pay | Admitting: Internal Medicine

## 2022-11-22 ENCOUNTER — Other Ambulatory Visit: Payer: Self-pay

## 2022-11-24 ENCOUNTER — Other Ambulatory Visit: Payer: Self-pay

## 2022-11-25 ENCOUNTER — Other Ambulatory Visit: Payer: Self-pay | Admitting: Internal Medicine

## 2022-11-25 ENCOUNTER — Other Ambulatory Visit: Payer: Self-pay

## 2022-11-27 ENCOUNTER — Other Ambulatory Visit: Payer: Self-pay

## 2022-11-29 ENCOUNTER — Other Ambulatory Visit: Payer: Self-pay

## 2022-11-29 MED FILL — Fluconazole Tab 150 MG: ORAL | 2 days supply | Qty: 2 | Fill #0 | Status: AC

## 2022-11-29 MED FILL — Estradiol Vaginal Cream 0.1 MG/GM: VAGINAL | 90 days supply | Qty: 42.5 | Fill #2 | Status: AC

## 2022-12-01 ENCOUNTER — Encounter (INDEPENDENT_AMBULATORY_CARE_PROVIDER_SITE_OTHER): Payer: Self-pay

## 2022-12-07 ENCOUNTER — Other Ambulatory Visit: Payer: Self-pay

## 2022-12-07 ENCOUNTER — Other Ambulatory Visit: Payer: Self-pay | Admitting: Internal Medicine

## 2022-12-07 MED ORDER — FOSFOMYCIN TROMETHAMINE 3 G PO PACK
3.0000 g | PACK | Freq: Once | ORAL | 0 refills | Status: AC
  Filled 2022-12-07: qty 3, 2d supply, fill #0

## 2022-12-07 MED ORDER — SCOPOLAMINE 1 MG/3DAYS TD PT72
1.0000 | MEDICATED_PATCH | TRANSDERMAL | 0 refills | Status: DC
  Filled 2022-12-07 – 2022-12-12 (×3): qty 10, 30d supply, fill #0

## 2022-12-08 ENCOUNTER — Other Ambulatory Visit: Payer: Self-pay

## 2022-12-09 ENCOUNTER — Telehealth: Payer: Self-pay | Admitting: Pharmacy Technician

## 2022-12-09 ENCOUNTER — Other Ambulatory Visit (HOSPITAL_COMMUNITY): Payer: Self-pay

## 2022-12-09 NOTE — Telephone Encounter (Signed)
Pharmacy Patient Advocate Encounter   Received notification from CoverMyMeds that prior authorization for Scopolamine 1MG /3DAYS 72 hr patches is required/requested.   Insurance verification completed.   The patient is insured through [employer] .   Per test claim: PA required; PA submitted to [employer] ** via CoverMyMeds Key/confirmation #/EOC [id number] Status is pending

## 2022-12-09 NOTE — Telephone Encounter (Signed)
Pharmacy Patient Advocate Encounter   Received notification from CoverMyMeds that prior authorization for Scopolamine 1MG /3DAYS 72 hr patches is required/requested.   Insurance verification completed.   The patient is insured through [employer].   Per test claim: PA required; PA started via CoverMyMeds. KEY [id number] . Waiting for clinical questions to populate.

## 2022-12-12 ENCOUNTER — Other Ambulatory Visit (HOSPITAL_COMMUNITY): Payer: Self-pay

## 2022-12-12 ENCOUNTER — Other Ambulatory Visit: Payer: Self-pay

## 2022-12-12 NOTE — Telephone Encounter (Signed)
Pharmacy Patient Advocate Encounter  Received notification from [facility] medicare  that Prior Authorization for Scopolamine has been APPROVED from 12/10/22 to 12/09/23   PA #/Case ID/Reference #: [id number]

## 2022-12-12 NOTE — Telephone Encounter (Signed)
noted 

## 2022-12-25 ENCOUNTER — Other Ambulatory Visit: Payer: Self-pay

## 2022-12-27 ENCOUNTER — Other Ambulatory Visit: Payer: Self-pay

## 2022-12-27 ENCOUNTER — Encounter: Payer: Self-pay | Admitting: Internal Medicine

## 2022-12-27 ENCOUNTER — Ambulatory Visit (INDEPENDENT_AMBULATORY_CARE_PROVIDER_SITE_OTHER): Payer: Medicare Other | Admitting: Internal Medicine

## 2022-12-27 VITALS — BP 96/66 | HR 104 | Temp 97.2°F | Ht 68.0 in | Wt 281.6 lb

## 2022-12-27 DIAGNOSIS — E039 Hypothyroidism, unspecified: Secondary | ICD-10-CM | POA: Diagnosis not present

## 2022-12-27 DIAGNOSIS — I471 Supraventricular tachycardia, unspecified: Secondary | ICD-10-CM

## 2022-12-27 DIAGNOSIS — Z01818 Encounter for other preprocedural examination: Secondary | ICD-10-CM | POA: Insufficient documentation

## 2022-12-27 DIAGNOSIS — E669 Obesity, unspecified: Secondary | ICD-10-CM

## 2022-12-27 DIAGNOSIS — Z7984 Long term (current) use of oral hypoglycemic drugs: Secondary | ICD-10-CM

## 2022-12-27 DIAGNOSIS — E1169 Type 2 diabetes mellitus with other specified complication: Secondary | ICD-10-CM

## 2022-12-27 DIAGNOSIS — I152 Hypertension secondary to endocrine disorders: Secondary | ICD-10-CM

## 2022-12-27 DIAGNOSIS — I700 Atherosclerosis of aorta: Secondary | ICD-10-CM

## 2022-12-27 DIAGNOSIS — E1159 Type 2 diabetes mellitus with other circulatory complications: Secondary | ICD-10-CM

## 2022-12-27 MED ORDER — SEMAGLUTIDE(0.25 OR 0.5MG/DOS) 2 MG/3ML ~~LOC~~ SOPN
0.5000 mg | PEN_INJECTOR | SUBCUTANEOUS | 2 refills | Status: DC
  Filled 2022-12-27: qty 3, fill #0
  Filled 2023-01-03: qty 3, 28d supply, fill #0
  Filled 2023-01-29: qty 3, 28d supply, fill #1

## 2022-12-27 NOTE — Assessment & Plan Note (Signed)
Reviewed findings of prior CT scan today..  Patient is tolerating  statin therapy with 5 mg crestor

## 2022-12-27 NOTE — Progress Notes (Signed)
Subjective:  Patient ID: Tiffany Wells, female    DOB: March 10, 1956  Age: 67 y.o. MRN: [id number]  CC: The primary encounter diagnosis was Preoperative evaluation to rule out surgical contraindication. Diagnoses of Obesity, diabetes, and hypertension syndrome (HCC), Acquired hypothyroidism, SVT (supraventricular tachycardia), Aortic atherosclerosis (HCC), and Preoperative evaluation of a medical condition to rule out surgical contraindications (TAR required) were also pertinent to this visit.   HPI Tiffany Wells presents for  Chief Complaint  Patient presents with   Medical Management of Chronic Issues   1) Type 2 DM:  finally tolerating  0. 5. Mg ozempic   as of this week.    Vision changes:Eye exam with Bell.  Early cataracts, progressive vision loss.  Saw Bell, much worse now  sees porfilio on sept 19 .    Concerned that the change is a med side effect   2) perioperative evaluation:  she anticipates cataract surgery in the near future by Porfilio..  she has not had any chest pain . She has normal renal function and is controlled her diabetes without insulin .     Outpatient Medications Prior to Visit  Medication Sig Dispense Refill   clindamycin (CLEOCIN T) 1 % external solution Apply to affected sites on scalp twice a day until clear, then as needed 30 mL 2   diltiazem (CARDIZEM CD) 240 MG 24 hr capsule Take 1 capsule (240 mg total) by mouth daily. 90 capsule 3   diltiazem (CARDIZEM) 30 MG tablet Take 1 tablet (30 mg total) by mouth daily as needed (SVT). 30 tablet 3   diphenhydrAMINE (BENADRYL) 25 MG tablet As needed for allergies.     diphenoxylate-atropine (LOMOTIL) 2.5-0.025 MG tablet TAKE ONE TABLET BY MOUTH AS NEEDED 30 tablet 2   estradiol (ESTRACE) 0.1 MG/GM vaginal cream PLACE 1/4 APPLICATORFUL VAGINALLY DAILY. 42.5 g 9   fluconazole (DIFLUCAN) 150 MG tablet Take 1 tablet (150 mg total) by mouth daily for 2 days. 2 tablet 2   hyoscyamine (LEVSIN SL) 0.125 MG SL tablet  Place 1 tablet (0.125 mg total) under the tongue every 4 (four) hours as needed for cramping. 60 tablet 5   levothyroxine (SYNTHROID) 50 MCG tablet TAKE 1 TABLET BY MOUTH DAILY BEFORE BREAKFAST 90 tablet 3   metoprolol succinate (TOPROL-XL) 25 MG 24 hr tablet Take 1 tablet (25 mg total) by mouth daily. 90 tablet 1   omeprazole (PRILOSEC OTC) 20 MG tablet Take 20 mg by mouth daily.     ondansetron (ZOFRAN-ODT) 4 MG disintegrating tablet Take 1 tablet (4 mg total) by mouth every 8 (eight) hours as needed for nausea or vomiting. 20 tablet 0   promethazine (PHENERGAN) 12.5 MG tablet Take 1 tablet (12.5 mg total) by mouth every 8 (eight) hours as needed for nausea or vomiting. 20 tablet 0   rosuvastatin (CRESTOR) 10 MG tablet Take 1 tablet (10 mg total) by mouth daily. 90 tablet 1   scopolamine (TRANSDERM-SCOP) 1 MG/3DAYS Place 1 patch (1.5 mg total) onto the skin every 3 (three) days. 10 patch 0   Semaglutide,0.25 or 0.5MG /DOS, 2 MG/3ML SOPN Inject 0.25 mg into the skin once a week. (Patient taking differently: Inject 0.5 mg into the skin once a week.) 9 mL 0   No facility-administered medications prior to visit.    Review of Systems;  Patient denies headache, fevers, malaise, unintentional weight loss, skin rash, eye pain, sinus congestion and sinus pain, sore throat, dysphagia,  hemoptysis , cough, dyspnea, wheezing, chest pain,  palpitations, orthopnea, edema, abdominal pain, nausea, melena, diarrhea, constipation, flank pain, dysuria, hematuria, urinary  Frequency, nocturia, numbness, tingling, seizures,  Focal weakness, Loss of consciousness,  Tremor, insomnia, depression, anxiety, and suicidal ideation.      Objective:  BP 96/66   Pulse (!) 104   Temp (!) 97.2 F (36.2 C) (Oral)   Ht 5\' 8"  (1.727 m)   Wt 281 lb 9.6 oz (127.7 kg)   SpO2 97%   BMI 42.82 kg/m   BP Readings from Last 3 Encounters:  12/27/22 96/66  10/04/22 124/70  08/24/22 109/62    Wt Readings from Last 3  Encounters:  12/27/22 281 lb 9.6 oz (127.7 kg)  10/04/22 279 lb 3.2 oz (126.6 kg)  08/24/22 276 lb 12.8 oz (125.6 kg)    Physical Exam Vitals reviewed.  Constitutional:      General: She is not in acute distress.    Appearance: Normal appearance. She is normal weight. She is not ill-appearing, toxic-appearing or diaphoretic.  HENT:     Head: Normocephalic.  Eyes:     General: No scleral icterus.       Right eye: No discharge.        Left eye: No discharge.     Conjunctiva/sclera: Conjunctivae normal.  Cardiovascular:     Rate and Rhythm: Normal rate and regular rhythm.     Heart sounds: Normal heart sounds.  Pulmonary:     Effort: Pulmonary effort is normal. No respiratory distress.     Breath sounds: Normal breath sounds.  Musculoskeletal:        General: Normal range of motion.  Skin:    General: Skin is warm and dry.  Neurological:     General: No focal deficit present.     Mental Status: She is alert and oriented to person, place, and time. Mental status is at baseline.  Psychiatric:        Mood and Affect: Mood normal.        Behavior: Behavior normal.        Thought Content: Thought content normal.        Judgment: Judgment normal.    Lab Results  Component Value Date   HGBA1C 6.1 10/04/2022   HGBA1C 6.2 (A) 07/04/2022   HGBA1C 6.0 07/22/2021    Lab Results  Component Value Date   CREATININE 0.94 10/04/2022   CREATININE 0.90 07/11/2022   CREATININE 1.10 (H) 10/28/2021    Lab Results  Component Value Date   WBC 10.1 07/11/2022   HGB 13.7 07/11/2022   HCT 41.0 07/11/2022   PLT 232.0 07/11/2022   GLUCOSE 127 (H) 10/04/2022   CHOL 153 10/04/2022   TRIG 117.0 10/04/2022   HDL 43.50 10/04/2022   LDLDIRECT 100.0 10/04/2022   LDLCALC 86 10/04/2022   ALT 12 10/04/2022   AST 14 10/04/2022   NA 141 10/04/2022   K 4.3 10/04/2022   CL 102 10/04/2022   CREATININE 0.94 10/04/2022   BUN 18 10/04/2022   CO2 30 10/04/2022   TSH 2.69 10/04/2022   INR 1.1  02/13/2014   HGBA1C 6.1 10/04/2022   MICROALBUR 0.7 07/11/2022    No results found.  Assessment & Plan:  .Preoperative evaluation to rule out surgical contraindication -     EKG 12-Lead  Obesity, diabetes, and hypertension syndrome (HCC) Assessment & Plan: Well controlled M by today's A1c.  Continue ozempic at lowest dose.  She has documented intolerance to metformin.  Given her frequent UTI's,  SGLT 2 inhibitor  would likely not be tolerated   Lab Results  Component Value Date   HGBA1C 6.1 10/04/2022     Orders: -     Hemoglobin A1c; Future -     Lipid Panel w/reflex Direct LDL; Future -     Comprehensive metabolic panel; Future  Acquired hypothyroidism -     TSH; Future  SVT (supraventricular tachycardia) Assessment & Plan: On dual therapy for years , now with hypotension since losing 10% of body weight.  Reduce metoprolol succinate to 25 mg daily or in divided doses.     Aortic atherosclerosis (HCC) Assessment & Plan: Reviewed findings of prior CT scan today..  Patient is tolerating  statin therapy with 5 mg crestor    Preoperative evaluation of a medical condition to rule out surgical contraindications (TAR required) Assessment & Plan: I have ordered and reviewed a 12 lead EKG and find that there are no acute changes and patient is in sinus rhythm.     Screening labs have been ordered    Other orders -     Semaglutide(0.25 or 0.5MG /DOS); Inject 0.5 mg into the skin once a week.  Dispense: 3 mL; Refill: 2     I provided 30 minutes of face-to-face time during this encounter reviewing patient's last visit with me, patient's  most recent visit with cardiology,  nephrology,  and neurology,  recent surgical and non surgical procedures, previous  labs and imaging studies, counseling on currently addressed issues,  and post visit ordering to diagnostics and therapeutics .   Follow-up: Return in about 6 months (around 06/26/2023).   Sherlene Shams, MD

## 2022-12-27 NOTE — Patient Instructions (Signed)
You can return for your labs on or after Sept 18th (AIC TSH, LIPIDS COMP MET)  HOLD YOUR BIOTIN FOR AT LEAST 72 HOURS SO WE CAN CHECK THYROID  PREOPERATIVE LABS INCLUDED FOR UPCOMING SURGERY,   EKG WAS DONE TODAY

## 2022-12-27 NOTE — Assessment & Plan Note (Signed)
I have ordered and reviewed a 12 lead EKG and find that there are no acute changes and patient is in sinus rhythm.     Screening labs have been ordered

## 2022-12-27 NOTE — Assessment & Plan Note (Signed)
On dual therapy for years , now with hypotension since losing 10% of body weight.  Reduce metoprolol succinate to 25 mg daily or in divided doses.

## 2022-12-27 NOTE — Assessment & Plan Note (Signed)
Well controlled M by today's A1c.  Continue ozempic at lowest dose.  She has documented intolerance to metformin.  Given her frequent UTI's,  SGLT 2 inhibitor would likely not be tolerated   Lab Results  Component Value Date   HGBA1C 6.1 10/04/2022

## 2023-01-03 ENCOUNTER — Other Ambulatory Visit: Payer: Self-pay

## 2023-01-05 LAB — HM DIABETES EYE EXAM

## 2023-01-06 ENCOUNTER — Other Ambulatory Visit (INDEPENDENT_AMBULATORY_CARE_PROVIDER_SITE_OTHER): Payer: Medicare Other

## 2023-01-06 DIAGNOSIS — E669 Obesity, unspecified: Secondary | ICD-10-CM | POA: Diagnosis not present

## 2023-01-06 DIAGNOSIS — E1169 Type 2 diabetes mellitus with other specified complication: Secondary | ICD-10-CM

## 2023-01-06 DIAGNOSIS — E039 Hypothyroidism, unspecified: Secondary | ICD-10-CM

## 2023-01-06 DIAGNOSIS — E1159 Type 2 diabetes mellitus with other circulatory complications: Secondary | ICD-10-CM

## 2023-01-06 DIAGNOSIS — I152 Hypertension secondary to endocrine disorders: Secondary | ICD-10-CM

## 2023-01-06 LAB — COMPREHENSIVE METABOLIC PANEL
ALT: 12 U/L (ref 0–35)
AST: 14 U/L (ref 0–37)
Albumin: 3.7 g/dL (ref 3.5–5.2)
Alkaline Phosphatase: 73 U/L (ref 39–117)
BUN: 13 mg/dL (ref 6–23)
CO2: 28 mEq/L (ref 19–32)
Calcium: 8.8 mg/dL (ref 8.4–10.5)
Chloride: 104 mEq/L (ref 96–112)
Creatinine, Ser: 0.87 mg/dL (ref 0.40–1.20)
GFR: 68.08 mL/min (ref >60.00)
Glucose, Bld: 122 mg/dL — ABNORMAL HIGH (ref 70–99)
Potassium: 4 mEq/L (ref 3.5–5.1)
Sodium: 140 mEq/L (ref 135–145)
Total Bilirubin: 0.4 mg/dL (ref 0.2–1.2)
Total Protein: 6.3 g/dL (ref 6.0–8.3)

## 2023-01-06 LAB — HEMOGLOBIN A1C: Hgb A1c MFr Bld: 6.2 % (ref 4.6–6.5)

## 2023-01-06 LAB — TSH: TSH: 2.72 u[IU]/mL (ref 0.35–5.50)

## 2023-01-07 LAB — LIPID PANEL W/REFLEX DIRECT LDL
Cholesterol: 147 mg/dL (ref <200)
HDL: 45 mg/dL — ABNORMAL LOW (ref >=50)
LDL Cholesterol (Calc): 75 mg/dL (calc)
Non-HDL Cholesterol (Calc): 102 mg/dL (calc) (ref <130)
Total CHOL/HDL Ratio: 3.3 (calc) (ref <5.0)
Triglycerides: 171 mg/dL — ABNORMAL HIGH (ref <150)

## 2023-01-11 ENCOUNTER — Other Ambulatory Visit: Payer: Self-pay | Admitting: Internal Medicine

## 2023-01-19 ENCOUNTER — Encounter: Payer: Self-pay | Admitting: Internal Medicine

## 2023-01-20 MED ORDER — NYSTATIN-TRIAMCINOLONE 100000-0.1 UNIT/GM-% EX OINT: 1.0000 | TOPICAL_OINTMENT | Freq: Two times a day (BID) | CUTANEOUS | 2 refills | Status: AC

## 2023-01-29 MED FILL — Estradiol Vaginal Cream 0.1 MG/GM: VAGINAL | 90 days supply | Qty: 42.5 | Fill #3 | Status: AC

## 2023-01-30 ENCOUNTER — Other Ambulatory Visit: Payer: Self-pay

## 2023-01-30 MED FILL — Fluconazole Tab 150 MG: ORAL | 2 days supply | Qty: 2 | Fill #1 | Status: AC

## 2023-01-31 ENCOUNTER — Other Ambulatory Visit: Payer: Self-pay

## 2023-01-31 MED ORDER — SEMAGLUTIDE(0.25 OR 0.5MG/DOS) 2 MG/3ML ~~LOC~~ SOPN
0.5000 mg | PEN_INJECTOR | SUBCUTANEOUS | 0 refills | Status: DC
  Filled 2023-01-31: qty 3, 28d supply, fill #0
  Filled 2023-02-19: qty 3, 28d supply, fill #1
  Filled 2023-03-25: qty 3, 28d supply, fill #2

## 2023-02-03 ENCOUNTER — Other Ambulatory Visit: Payer: Self-pay

## 2023-02-06 ENCOUNTER — Other Ambulatory Visit: Payer: Self-pay

## 2023-02-07 ENCOUNTER — Encounter: Payer: Self-pay | Admitting: Ophthalmology

## 2023-02-10 NOTE — Discharge Instructions (Signed)

## 2023-02-13 NOTE — Anesthesia Preprocedure Evaluation (Signed)
Anesthesia Evaluation  Patient identified by MRN, date of birth, ID band Patient awake    Reviewed: Allergy & Precautions, H&P , NPO status , Patient's Chart, lab work & pertinent test results  Airway Mallampati: III  TM Distance: >3 FB Neck ROM: Full    Dental no notable dental hx. (+) Caps   Pulmonary neg pulmonary ROS   Pulmonary exam normal breath sounds clear to auscultation       Cardiovascular hypertension, Normal cardiovascular exam+ dysrhythmias  Rhythm:Regular Rate:Normal  05-25-17 PMH:  Pulmonary embolism, Dysrhythmia, DVT.  Risk  factors:  Hypertension.   -------------------------------------------------------------------  Study Conclusions   - Left ventricle: Wall thickness was increased in a pattern of mild    LVH. Systolic function was normal. The estimated ejection    fraction was in the range of 60% to 65%. Doppler parameters are    consistent with abnormal left ventricular relaxation (grade 1    diastolic dysfunction).  - Aortic valve: Valve area (VTI): 2.37 cm^2. Valve area (Vmax):    2.61 cm^2. Valve area (Vmean): 2.22 cm^2.  - Mitral valve: Valve area by continuity equation (using LVOT    flow): 3.04 cm^2.  - Right ventricle: The cavity size was mildly dilated.     Neuro/Psych  Headaches  Neuromuscular disease  negative psych ROS   GI/Hepatic Neg liver ROS, hiatal hernia,,,  Endo/Other  diabetesHypothyroidism    Renal/GU negative Renal ROS  negative genitourinary   Musculoskeletal  (+) Arthritis ,    Abdominal   Peds negative pediatric ROS (+)  Hematology negative hematology ROS (+)   Anesthesia Other Findings   Back pain  Hypertension Normal cardiac stress test  Internal hemorrhoids History of Salmonella gastroenteritis  DVT (deep venous thrombosis) (HCC) IBS (irritable bowel syndrome) Headache Arthritis  Pulmonary embolism Dysrhythmia  Acute pulmonary embolism (HCC) Ischemic  colitis (HCC)  Diabetes mellitus Vertigo  Anxiety about procedure today    Reproductive/Obstetrics negative OB ROS                              Anesthesia Physical Anesthesia Plan  ASA: 3  Anesthesia Plan: MAC   Post-op Pain Management:    Induction: Intravenous  PONV Risk Score and Plan:   Airway Management Planned: Natural Airway and Nasal Cannula  Additional Equipment:   Intra-op Plan:   Post-operative Plan:   Informed Consent: I have reviewed the patients History and Physical, chart, labs and discussed the procedure including the risks, benefits and alternatives for the proposed anesthesia with the patient or authorized representative who has indicated his/her understanding and acceptance.     Dental Advisory Given  Plan Discussed with: Anesthesiologist, CRNA and Surgeon  Anesthesia Plan Comments: (Patient consented for risks of anesthesia including but not limited to:  - adverse reactions to medications - damage to eyes, teeth, lips or other oral mucosa - nerve damage due to positioning  - sore throat or hoarseness - Damage to heart, brain, nerves, lungs, other parts of body or loss of life  Patient voiced understanding and assent.)         Anesthesia Quick Evaluation

## 2023-02-14 ENCOUNTER — Encounter: Payer: Self-pay | Admitting: Ophthalmology

## 2023-02-14 ENCOUNTER — Ambulatory Visit: Payer: Medicare Other | Admitting: Anesthesiology

## 2023-02-14 ENCOUNTER — Ambulatory Visit
Admission: RE | Admit: 2023-02-14 | Discharge: 2023-02-14 | Disposition: A | Discharge status: Home / Self Care | Payer: Medicare Other | Attending: Ophthalmology | Admitting: Ophthalmology

## 2023-02-14 ENCOUNTER — Encounter
Admission: RE | Disposition: A | Discharge status: Home / Self Care | Payer: Self-pay | Source: Home / Self Care | Attending: Ophthalmology

## 2023-02-14 ENCOUNTER — Other Ambulatory Visit: Payer: Self-pay

## 2023-02-14 DIAGNOSIS — I4891 Unspecified atrial fibrillation: Secondary | ICD-10-CM | POA: Diagnosis not present

## 2023-02-14 DIAGNOSIS — Z7985 Long-term (current) use of injectable non-insulin antidiabetic drugs: Secondary | ICD-10-CM | POA: Insufficient documentation

## 2023-02-14 DIAGNOSIS — H2512 Age-related nuclear cataract, left eye: Secondary | ICD-10-CM | POA: Insufficient documentation

## 2023-02-14 DIAGNOSIS — Z86711 Personal history of pulmonary embolism: Secondary | ICD-10-CM | POA: Insufficient documentation

## 2023-02-14 DIAGNOSIS — E1136 Type 2 diabetes mellitus with diabetic cataract: Secondary | ICD-10-CM | POA: Insufficient documentation

## 2023-02-14 DIAGNOSIS — Z86718 Personal history of other venous thrombosis and embolism: Secondary | ICD-10-CM | POA: Insufficient documentation

## 2023-02-14 DIAGNOSIS — I10 Essential (primary) hypertension: Secondary | ICD-10-CM | POA: Diagnosis not present

## 2023-02-14 HISTORY — PX: CATARACT EXTRACTION W/PHACO: SHX586

## 2023-02-14 HISTORY — DX: Dizziness and giddiness: R42

## 2023-02-14 LAB — GLUCOSE, CAPILLARY: Glucose-Capillary: 123 mg/dL — ABNORMAL HIGH (ref 70–99)

## 2023-02-14 SURGERY — PHACOEMULSIFICATION, CATARACT, WITH IOL INSERTION
Anesthesia: Monitor Anesthesia Care | Site: Eye | Laterality: Left

## 2023-02-14 MED ORDER — SIGHTPATH DOSE#1 BSS IO SOLN: INTRAOCULAR | Status: DC | PRN

## 2023-02-14 MED ORDER — TETRACAINE HCL 0.5 % OP SOLN
1.0000 [drp] | OPHTHALMIC | Status: DC | PRN
  Administered 2023-02-14 (×3): 1 [drp] via OPHTHALMIC

## 2023-02-14 MED ORDER — FENTANYL CITRATE (PF) 100 MCG/2ML IJ SOLN
INTRAMUSCULAR | Status: DC | PRN
  Administered 2023-02-14: 50 ug via INTRAVENOUS

## 2023-02-14 MED ORDER — BRIMONIDINE TARTRATE-TIMOLOL 0.2-0.5 % OP SOLN
OPHTHALMIC | Status: DC | PRN
  Administered 2023-02-14: 1 [drp] via OPHTHALMIC

## 2023-02-14 MED ORDER — MIDAZOLAM HCL 2 MG/2ML IJ SOLN
INTRAMUSCULAR | Status: AC
  Filled 2023-02-14: qty 2

## 2023-02-14 MED ORDER — ARMC OPHTHALMIC DILATING DROPS
1.0000 | OPHTHALMIC | Status: DC | PRN
  Administered 2023-02-14 (×3): 1 via OPHTHALMIC

## 2023-02-14 MED ORDER — MOXIFLOXACIN HCL 0.5 % OP SOLN
OPHTHALMIC | Status: DC | PRN
  Administered 2023-02-14: .2 mL via OPHTHALMIC

## 2023-02-14 MED ORDER — LACTATED RINGERS IV SOLN: INTRAVENOUS | Status: DC

## 2023-02-14 MED ORDER — SIGHTPATH DOSE#1 BSS IO SOLN
INTRAOCULAR | Status: DC | PRN
  Administered 2023-02-14: 15 mL

## 2023-02-14 MED ORDER — SIGHTPATH DOSE#1 NA CHONDROIT SULF-NA HYALURON 40-17 MG/ML IO SOLN
INTRAOCULAR | Status: DC | PRN
  Administered 2023-02-14: 1 mL via INTRAOCULAR

## 2023-02-14 MED ORDER — SODIUM CHLORIDE 0.9% FLUSH: 10.0000 mL | INTRAVENOUS | Status: DC | PRN

## 2023-02-14 MED ORDER — MIDAZOLAM HCL 2 MG/2ML IJ SOLN
INTRAMUSCULAR | Status: DC | PRN
  Administered 2023-02-14: 2 mg via INTRAVENOUS

## 2023-02-14 MED ORDER — TETRACAINE HCL 0.5 % OP SOLN
OPHTHALMIC | Status: AC
  Filled 2023-02-14: qty 4

## 2023-02-14 MED ORDER — SIGHTPATH DOSE#1 BSS IO SOLN
INTRAOCULAR | Status: DC | PRN
  Administered 2023-02-14: 90 mL via OPHTHALMIC

## 2023-02-14 MED ORDER — FENTANYL CITRATE (PF) 100 MCG/2ML IJ SOLN
INTRAMUSCULAR | Status: AC
  Filled 2023-02-14: qty 2

## 2023-02-14 MED ORDER — ARMC OPHTHALMIC DILATING DROPS
OPHTHALMIC | Status: AC
  Filled 2023-02-14: qty 0.5

## 2023-02-14 SURGICAL SUPPLY — 18 items
ANGLE REVERSE CUT SHRT 25GA (CUTTER) ×1
CANNULA ANT/CHMB 27G (MISCELLANEOUS) IMPLANT
CANNULA ANT/CHMB 27GA (MISCELLANEOUS)
CATARACT SUITE SIGHTPATH (MISCELLANEOUS) ×1
CYSTOTOME ANGL RVRS SHRT 25G (CUTTER) ×1 IMPLANT
FEE CATARACT SUITE SIGHTPATH (MISCELLANEOUS) ×1 IMPLANT
GLOVE BIOGEL PI IND STRL 8 (GLOVE) ×1 IMPLANT
GLOVE SURG LX STRL 8.0 MICRO (GLOVE) ×1 IMPLANT
LENS CLAREON VIVITY TORIC 15.5 ×1 IMPLANT
LENS CLRN VIVITY TORIC 3 15.5 ×1 IMPLANT
LENS IOL CLRN VT TRC 3 15.5 IMPLANT
NDL FILTER BLUNT 18X1 1/2 (NEEDLE) ×1 IMPLANT
NEEDLE FILTER BLUNT 18X1 1/2 (NEEDLE) ×1
PACK VIT ANT 23G (MISCELLANEOUS) IMPLANT
RING MALYGIN (MISCELLANEOUS) IMPLANT
SUT ETHILON 10-0 CS-B-6CS-B-6 (SUTURE)
SUTURE EHLN 10-0 CS-B-6CS-B-6 (SUTURE) IMPLANT
SYR 3ML LL SCALE MARK (SYRINGE) ×1 IMPLANT

## 2023-02-14 NOTE — Anesthesia Postprocedure Evaluation (Signed)
Anesthesia Post Note  Patient: Tiffany Wells  Procedure(s) Performed: CATARACT EXTRACTION PHACO AND INTRAOCULAR LENS PLACEMENT (IOC) LEFT CLAREON VIVITY TORIC DIABETIC (Left: Eye)  Patient location during evaluation: PACU Anesthesia Type: MAC Level of consciousness: awake and alert Pain management: pain level controlled Vital Signs Assessment: post-procedure vital signs reviewed and stable Respiratory status: spontaneous breathing, nonlabored ventilation, respiratory function stable and patient connected to nasal cannula oxygen Cardiovascular status: stable and blood pressure returned to baseline Postop Assessment: no apparent nausea or vomiting Anesthetic complications: no   No notable events documented.   Last Vitals:  Vitals:   02/14/23 0819 02/14/23 0825  BP: 126/89 132/78  Pulse: 88   Resp: 18   Temp: 36.6 C (!) 36.4 C  SpO2: 95% 96%    Last Pain:  Vitals:   02/14/23 0825  TempSrc:   PainSc: 0-No pain                 Kaiyla Stahly C Saige Busby

## 2023-02-14 NOTE — H&P (Signed)
[facility]   Primary Care Physician:  Sherlene Shams, MD Ophthalmologist: Dr. Druscilla Brownie  Pre-Procedure History & Physical: HPI:  Tiffany Wells is a 67 y.o. female here for cataract surgery.   Past Medical History:  Diagnosis Date   Acute pulmonary embolism (HCC) 08/06/2013   Diagnosed during hospitalization for TKR.  Patient received heparin followed by Xarelto whic hwill continue for a total of 6 months.   [facility] has recommended IVC placement prior to next TKR and bridging with Lovenox (June 17  Schnier)     Arthritis    osteoarthritis of knee   Back pain    Diabetes mellitus without complication (HCC)    DVT (deep venous thrombosis) (HCC) 12/28/2016   Dysrhythmia 12/28/2016   atrial fibrillation, SVT   Headache    Migraines   History of Salmonella gastroenteritis 2012   Hypertension    IBS (irritable bowel syndrome)    Internal hemorrhoids    Ischemic colitis (HCC) 02/21/2014   Normal cardiac stress test    2006, nromal stress cardiolite, EF 65%   Pulmonary embolism (HCC)    post knee surgery   Vertigo    few episodes    Past Surgical History:  Procedure Laterality Date   COLONOSCOPY  03/07/2011   Int Hemms   COLONOSCOPY N/A 08/24/2022   Procedure: COLONOSCOPY;  Surgeon: Toledo, Boykin Nearing, MD;  Location: [facility] ENDOSCOPY;  Service: Gastroenterology;  Laterality: N/A;   COLONOSCOPY WITH PROPOFOL N/A 02/20/2017   Procedure: COLONOSCOPY WITH PROPOFOL;  Surgeon: Scot Jun, MD;  Location: [facility] ENDOSCOPY;  Service: Endoscopy;  Laterality: N/A;   HYSTEROSCOPY     JOINT REPLACEMENT     Bilateral TKR   ROTATOR CUFF REPAIR     TUBAL LIGATION     UPPER GASTROINTESTINAL ENDOSCOPY  03/07/2011   HH Multip Gastric Polyps/ Dr Mechele Collin    Prior to Admission medications   Medication Sig Start Date End Date Taking? Authorizing Provider  clindamycin (CLEOCIN T) 1 % external solution Apply to affected sites on scalp twice a day until clear, then as needed 05/27/21  Yes    cyclobenzaprine (FLEXERIL) 10 MG tablet Take 10 mg by mouth at bedtime.   Yes [provider]  diltiazem (CARDIZEM CD) 240 MG 24 hr capsule Take 1 capsule (240 mg total) by mouth daily. 05/19/22  Yes   diltiazem (CARDIZEM) 30 MG tablet Take 1 tablet (30 mg total) by mouth daily as needed (SVT). 07/28/21  Yes Sherlene Shams, MD  diphenhydrAMINE (BENADRYL) 25 MG tablet As needed for allergies.   Yes [provider]  estradiol (ESTRACE) 0.1 MG/GM vaginal cream PLACE 1/4 APPLICATORFUL VAGINALLY DAILY. 04/26/22  Yes Sherlene Shams, MD  hyoscyamine (LEVSIN SL) 0.125 MG SL tablet PLACE 1 TABLET UNDER THE TONGUE EVERY 4 HOURS AS NEEDED FOR CRAMPING. 01/11/23  Yes Sherlene Shams, MD  levothyroxine (SYNTHROID) 50 MCG tablet TAKE 1 TABLET BY MOUTH DAILY BEFORE BREAKFAST 10/13/22  Yes Sherlene Shams, MD  metoprolol succinate (TOPROL-XL) 25 MG 24 hr tablet Take 1 tablet (25 mg total) by mouth daily. 10/04/22  Yes Sherlene Shams, MD  nystatin-triamcinolone ointment [facility]) Apply 1 Application topically 2 (two) times daily. 01/20/23  Yes Sherlene Shams, MD  omeprazole (PRILOSEC OTC) 20 MG tablet Take 20 mg by mouth daily.   Yes [provider]  ondansetron (ZOFRAN-ODT) 4 MG disintegrating tablet Take 1 tablet (4 mg total) by mouth every 8 (eight) hours as needed for nausea  or vomiting. 08/17/22  Yes Sherlene Shams, MD  promethazine (PHENERGAN) 12.5 MG tablet Take 1 tablet (12.5 mg total) by mouth every 8 (eight) hours as needed for nausea or vomiting. 08/22/19  Yes Sherlene Shams, MD  rosuvastatin (CRESTOR) 10 MG tablet Take 1 tablet (10 mg total) by mouth daily. 10/06/22  Yes Sherlene Shams, MD  Semaglutide,0.25 or 0.5MG /DOS, 2 MG/3ML SOPN Inject 0.5 mg into the skin once a week. 01/31/23  Yes Sherlene Shams, MD  diphenoxylate-atropine (LOMOTIL) 2.5-0.025 MG tablet TAKE ONE TABLET BY MOUTH AS NEEDED 08/03/18   Sherlene Shams, MD  fluconazole (DIFLUCAN) 150 MG tablet Take 1 tablet (150 mg  total) by mouth daily for 2 days. Patient not taking: Reported on 02/07/2023 11/29/22   Sherlene Shams, MD  scopolamine (TRANSDERM-SCOP) 1 MG/3DAYS Place 1 patch (1.5 mg total) onto the skin every 3 (three) days. 12/07/22   Sherlene Shams, MD    Allergies as of 01/10/2023 - Review Complete 10/04/2022  Allergen Reaction Noted   Codeine Itching 07/21/2017   Codeine sulfate     Glucophage xr [metformin]  12/29/2020   Sulfa antibiotics  02/03/2014   Tape Rash 02/17/2017    Family History  Problem Relation Age of Onset   Hypertension Mother    Diabetes Mother    Cancer Mother        glioma   Hypertension Father    Heart disease Father 27       CAD with CABG   Cancer Father        multiple myeloma   Arthritis Sister    Hypertension Sister    Cancer Brother        Pre-cancerous lip lesion, testicular CA    Diabetes Daughter    Cancer Maternal Grandmother        Lung Ca, nonsmoker     Social History   Socioeconomic History   Marital status: Married    Spouse name: Not on file   Number of children: 2   Years of education: Not on file   Highest education level: Bachelor's degree (e.g., BA, AB, BS)  Occupational History   Occupation: Print production planner, 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   Social Determinants of Health   Financial Resource Strain: Low Risk  (09/30/2022)   Overall Financial Resource Strain (CARDIA)    Difficulty of Paying Living Expenses: Not hard at all  Food Insecurity: No Food Insecurity (09/30/2022)   Hunger Vital Sign    Worried About Running Out of Food in the Last  Year: Never true    Ran Out of Food in the Last Year: Never true  Transportation Needs: No Transportation Needs (09/30/2022)   PRAPARE - Administrator, Civil Service (Medical): No    Lack of Transportation (Non-Medical): No  Physical Activity: Unknown (09/30/2022)   Exercise Vital Sign    Days of Exercise per Week: 0 days    Minutes of Exercise per Session: Not on file  Stress: No Stress Concern Present (09/30/2022)   [employer] of Occupational Health - Occupational Stress Questionnaire    Feeling of Stress : Only a little  Social Connections: Socially Integrated (09/30/2022)   Social Connection and Isolation Panel [NHANES]    Frequency of Communication with Friends and Family: More than three times a week    Frequency of Social Gatherings with Friends and Family: Once a week    Attends Religious Services: More than 4 times per year    Active Member of [employer] or Organizations: Yes    Attends Engineer, structural: More than 4 times per year    Marital Status: Married  Catering manager Violence: Not At Risk (05/31/2021)   Humiliation, Afraid, Rape, and Kick questionnaire    Fear of Current or Ex-Partner: No    Emotionally Abused: No    Physically Abused: No    Sexually Abused: No    Review of Systems: See HPI, otherwise negative ROS  Physical Exam: BP (!) 146/81   Pulse 95   Temp (!) 97.2 F (36.2 C) (Temporal)   Resp (!) 22   Ht 5\' 8"  (1.727 m)   Wt 127 kg   SpO2 98%   BMI 42.57 kg/m  General:   Alert, cooperative in NAD Head:  Normocephalic and atraumatic. Respiratory:  Normal work of breathing. Cardiovascular:  RRR  Impression/Plan: Tiffany Wells is here for cataract surgery.  Risks, benefits, limitations, and alternatives regarding cataract surgery have been reviewed with the patient.  Questions have been answered.  All parties agreeable.   Galen Manila, MD  02/14/2023, 7:49 AM

## 2023-02-14 NOTE — Op Note (Signed)
PREOPERATIVE DIAGNOSIS:  Nuclear sclerotic cataract of the left eye.   POSTOPERATIVE DIAGNOSIS:  Nuclear sclerotic cataract of the left eye.   OPERATIVE PROCEDURE: Procedure(s): CATARACT EXTRACTION PHACO AND INTRAOCULAR LENS PLACEMENT (IOC) LEFT CLAREON VIVITY TORIC DIABETIC   SURGEON:  Tiffany Manila, MD.   ANESTHESIA: 1.      Managed anesthesia care. 2.     0.42ml os Shugarcaine was instilled following the paracentesis [email or web address]   COMPLICATIONS:  None.   TECHNIQUE:   Stop and chop    DESCRIPTION OF PROCEDURE:  The patient was examined and consented in the preoperative holding area where the aforementioned topical anesthesia was applied to the left eye.  The patient was brought back to the Operating Room where he was sat upright on the gurney and given a target to fixate upon while the eye was marked at the 3:00 and 9:00 position.  The patient was then reclined on the operating table.  The eye was prepped and draped in the usual sterile ophthalmic fashion and a lid speculum was placed. A paracentesis was created with the side port blade and the anterior chamber was filled with viscoelastic. A near clear corneal incision was performed with the steel keratome. A continuous curvilinear capsulorrhexis was performed with a cystotome followed by the capsulorrhexis forceps. Hydrodissection and hydrodelineation were carried out with BSS on a blunt cannula. The lens was removed in a stop and chop technique and the remaining cortical material was removed with the irrigation-aspiration handpiece. The eye was inflated with viscoelastic and the ZCT lens was placed in the eye and rotated to within a few degrees of the predetermined orientation.  The remaining viscoelastic was removed from the eye.  The Sinskey hook was used to rotate the toric lens into its final resting place at 157 degrees.  0.1 ml of Vigamox was placed in the anterior chamber. The eye was inflated to a physiologic pressure and found to  be watertight.  The eye was dressed with Vigamox.and Combigan. The patient was given protective glasses to wear throughout the day and a shield with which to sleep tonight. The patient was also given drops with which to begin a drop regimen today and will follow-up with me in one day. Implant Name Type Inv. Item Serial No. Manufacturer Lot No. LRB No. Used Action  LENS CLAREON VIVITY TORIC 15.5 - [id number]  LENS CLAREON VIVITY TORIC 15.5 [id number] S[employer]  Left 1 Implanted   Procedure(s) with comments: CATARACT EXTRACTION PHACO AND INTRAOCULAR LENS PLACEMENT (IOC) LEFT CLAREON VIVITY TORIC DIABETIC (Left) - 8.26 0:49.3  Electronically signed: Chrissie Noa Afia Messenger 10/29/20248:18 AM 157

## 2023-02-14 NOTE — Transfer of Care (Signed)
Immediate Anesthesia Transfer of Care Note  Patient: Tiffany Wells  Procedure(s) Performed: CATARACT EXTRACTION PHACO AND INTRAOCULAR LENS PLACEMENT (IOC) LEFT CLAREON VIVITY TORIC DIABETIC (Left: Eye)  Patient Location: PACU  Anesthesia Type: MAC  Level of Consciousness: awake, alert  and patient cooperative  Airway and Oxygen Therapy: Patient Spontanous Breathing and Patient connected to supplemental oxygen  Post-op Assessment: Post-op Vital signs reviewed, Patient's Cardiovascular Status Stable, Respiratory Function Stable, Patent Airway and No signs of Nausea or vomiting  Post-op Vital Signs: Reviewed and stable  Complications: No notable events documented.

## 2023-02-15 ENCOUNTER — Encounter: Payer: Self-pay | Admitting: Ophthalmology

## 2023-02-17 NOTE — Discharge Instructions (Signed)

## 2023-02-19 ENCOUNTER — Other Ambulatory Visit: Payer: Self-pay

## 2023-02-19 ENCOUNTER — Other Ambulatory Visit: Payer: Self-pay | Admitting: Internal Medicine

## 2023-02-20 ENCOUNTER — Encounter
Admission: RE | Disposition: A | Discharge status: Home / Self Care | Payer: Self-pay | Source: Home / Self Care | Attending: Ophthalmology

## 2023-02-20 ENCOUNTER — Encounter: Payer: Self-pay | Admitting: Ophthalmology

## 2023-02-20 ENCOUNTER — Ambulatory Visit
Admission: RE | Admit: 2023-02-20 | Discharge: 2023-02-20 | Disposition: A | Discharge status: Home / Self Care | Payer: Medicare Other | Attending: Ophthalmology | Admitting: Ophthalmology

## 2023-02-20 ENCOUNTER — Other Ambulatory Visit: Payer: Self-pay

## 2023-02-20 ENCOUNTER — Ambulatory Visit: Payer: Medicare Other | Admitting: Anesthesiology

## 2023-02-20 DIAGNOSIS — H2511 Age-related nuclear cataract, right eye: Secondary | ICD-10-CM | POA: Insufficient documentation

## 2023-02-20 DIAGNOSIS — I10 Essential (primary) hypertension: Secondary | ICD-10-CM | POA: Insufficient documentation

## 2023-02-20 DIAGNOSIS — Z86718 Personal history of other venous thrombosis and embolism: Secondary | ICD-10-CM | POA: Diagnosis not present

## 2023-02-20 DIAGNOSIS — Z86711 Personal history of pulmonary embolism: Secondary | ICD-10-CM | POA: Diagnosis not present

## 2023-02-20 DIAGNOSIS — I4891 Unspecified atrial fibrillation: Secondary | ICD-10-CM | POA: Insufficient documentation

## 2023-02-20 DIAGNOSIS — Z96653 Presence of artificial knee joint, bilateral: Secondary | ICD-10-CM | POA: Diagnosis not present

## 2023-02-20 HISTORY — PX: CATARACT EXTRACTION W/PHACO: SHX586

## 2023-02-20 LAB — GLUCOSE, CAPILLARY: Glucose-Capillary: 128 mg/dL — ABNORMAL HIGH (ref 70–99)

## 2023-02-20 SURGERY — PHACOEMULSIFICATION, CATARACT, WITH IOL INSERTION
Anesthesia: Monitor Anesthesia Care | Site: Eye | Laterality: Right

## 2023-02-20 MED ORDER — TETRACAINE HCL 0.5 % OP SOLN
1.0000 [drp] | OPHTHALMIC | Status: DC | PRN
  Administered 2023-02-20 (×3): 1 [drp] via OPHTHALMIC

## 2023-02-20 MED ORDER — SIGHTPATH DOSE#1 BSS IO SOLN
INTRAOCULAR | Status: DC | PRN
  Administered 2023-02-20: 55 mL via OPHTHALMIC

## 2023-02-20 MED ORDER — ARMC OPHTHALMIC DILATING DROPS
OPHTHALMIC | Status: AC
Start: 2023-02-20 — End: ?
  Filled 2023-02-20: qty 0.5

## 2023-02-20 MED ORDER — SIGHTPATH DOSE#1 BSS IO SOLN
INTRAOCULAR | Status: DC | PRN
  Administered 2023-02-20: 2 mL

## 2023-02-20 MED ORDER — ARMC OPHTHALMIC DILATING DROPS
1.0000 | OPHTHALMIC | Status: DC | PRN
  Administered 2023-02-20 (×3): 1 via OPHTHALMIC

## 2023-02-20 MED ORDER — SIGHTPATH DOSE#1 BSS IO SOLN
INTRAOCULAR | Status: DC | PRN
  Administered 2023-02-20: 15 mL via INTRAOCULAR

## 2023-02-20 MED ORDER — MIDAZOLAM HCL 2 MG/2ML IJ SOLN
INTRAMUSCULAR | Status: DC | PRN
  Administered 2023-02-20: 2 mg via INTRAVENOUS

## 2023-02-20 MED ORDER — FENTANYL CITRATE (PF) 100 MCG/2ML IJ SOLN
INTRAMUSCULAR | Status: AC
  Filled 2023-02-20: qty 2

## 2023-02-20 MED ORDER — BRIMONIDINE TARTRATE-TIMOLOL 0.2-0.5 % OP SOLN
OPHTHALMIC | Status: DC | PRN
  Administered 2023-02-20: 1 [drp] via OPHTHALMIC

## 2023-02-20 MED ORDER — FENTANYL CITRATE (PF) 100 MCG/2ML IJ SOLN
INTRAMUSCULAR | Status: DC | PRN
  Administered 2023-02-20 (×2): 50 ug via INTRAVENOUS

## 2023-02-20 MED ORDER — SODIUM CHLORIDE 0.9% FLUSH: 10.0000 mL | Freq: Two times a day (BID) | INTRAVENOUS | Status: DC

## 2023-02-20 MED ORDER — MIDAZOLAM HCL 2 MG/2ML IJ SOLN
INTRAMUSCULAR | Status: AC
  Filled 2023-02-20: qty 2

## 2023-02-20 MED ORDER — TETRACAINE HCL 0.5 % OP SOLN
OPHTHALMIC | Status: AC
Start: 2023-02-20 — End: ?
  Filled 2023-02-20: qty 4

## 2023-02-20 MED ORDER — SIGHTPATH DOSE#1 NA CHONDROIT SULF-NA HYALURON 40-17 MG/ML IO SOLN
INTRAOCULAR | Status: DC | PRN
  Administered 2023-02-20: 1 mL via INTRAOCULAR

## 2023-02-20 MED ORDER — MOXIFLOXACIN HCL 0.5 % OP SOLN
OPHTHALMIC | Status: DC | PRN
  Administered 2023-02-20: .2 mL via OPHTHALMIC

## 2023-02-20 SURGICAL SUPPLY — 14 items
ANGLE REVERSE CUT SHRT 25GA (CUTTER) ×1
CANNULA ANT/CHMB 27G (MISCELLANEOUS) IMPLANT
CANNULA ANT/CHMB 27GA (MISCELLANEOUS)
CATARACT SUITE SIGHTPATH (MISCELLANEOUS) ×1
CYSTOTOME ANGL RVRS SHRT 25G (CUTTER) ×1 IMPLANT
FEE CATARACT SUITE SIGHTPATH (MISCELLANEOUS) ×1 IMPLANT
GLOVE BIOGEL PI IND STRL 8 (GLOVE) ×1 IMPLANT
GLOVE SURG LX STRL 8.0 MICRO (GLOVE) ×1 IMPLANT
LENS CLAREON VIVITY TORIC 14.0 ×1 IMPLANT
LENS CLRN VIVITY TORIC 3 14.0 ×1 IMPLANT
LENS IOL CLRN VT TRC 3 14.0 IMPLANT
NDL FILTER BLUNT 18X1 1/2 (NEEDLE) ×1 IMPLANT
NEEDLE FILTER BLUNT 18X1 1/2 (NEEDLE) ×1
SYR 3ML LL SCALE MARK (SYRINGE) ×1 IMPLANT

## 2023-02-20 NOTE — H&P (Signed)
[facility]   Primary Care Physician:  Sherlene Shams, MD Ophthalmologist: Dr. Druscilla Brownie  Pre-Procedure History & Physical: HPI:  NATALIN BIBLE is a 67 y.o. female here for cataract surgery.   Past Medical History:  Diagnosis Date   Acute pulmonary embolism (HCC) 08/06/2013   Diagnosed during hospitalization for TKR.  Patient received heparin followed by Xarelto whic hwill continue for a total of 6 months.   [facility] has recommended IVC placement prior to next TKR and bridging with Lovenox (June 17  Schnier)     Arthritis    osteoarthritis of knee   Back pain    Diabetes mellitus without complication (HCC)    DVT (deep venous thrombosis) (HCC) 12/28/2016   Dysrhythmia 12/28/2016   atrial fibrillation, SVT   Headache    Migraines   History of Salmonella gastroenteritis 2012   Hypertension    IBS (irritable bowel syndrome)    Internal hemorrhoids    Ischemic colitis (HCC) 02/21/2014   Normal cardiac stress test    2006, nromal stress cardiolite, EF 65%   Pulmonary embolism (HCC)    post knee surgery   Vertigo    few episodes    Past Surgical History:  Procedure Laterality Date   CATARACT EXTRACTION W/PHACO Left 02/14/2023   Procedure: CATARACT EXTRACTION PHACO AND INTRAOCULAR LENS PLACEMENT (IOC) LEFT CLAREON VIVITY TORIC DIABETIC;  Surgeon: Galen Manila, MD;  Location: [facility];  Service: Ophthalmology;  Laterality: Left;  8.26 0:49.3   COLONOSCOPY  03/07/2011   Int Hemms   COLONOSCOPY N/A 08/24/2022   Procedure: COLONOSCOPY;  Surgeon: Toledo, Boykin Nearing, MD;  Location: [facility] ENDOSCOPY;  Service: Gastroenterology;  Laterality: N/A;   COLONOSCOPY WITH PROPOFOL N/A 02/20/2017   Procedure: COLONOSCOPY WITH PROPOFOL;  Surgeon: Scot Jun, MD;  Location: [facility] ENDOSCOPY;  Service: Endoscopy;  Laterality: N/A;   HYSTEROSCOPY     JOINT REPLACEMENT     Bilateral TKR   ROTATOR CUFF REPAIR     TUBAL LIGATION     UPPER GASTROINTESTINAL ENDOSCOPY   03/07/2011   HH Multip Gastric Polyps/ Dr Mechele Collin    Prior to Admission medications   Medication Sig Start Date End Date Taking? Authorizing Provider  clindamycin (CLEOCIN T) 1 % external solution Apply to affected sites on scalp twice a day until clear, then as needed 05/27/21  Yes   cyclobenzaprine (FLEXERIL) 10 MG tablet Take 10 mg by mouth at bedtime.   Yes [provider]  diltiazem (CARDIZEM CD) 240 MG 24 hr capsule Take 1 capsule (240 mg total) by mouth daily. 05/19/22  Yes   diltiazem (CARDIZEM) 30 MG tablet Take 1 tablet (30 mg total) by mouth daily as needed (SVT). 07/28/21  Yes Sherlene Shams, MD  diphenhydrAMINE (BENADRYL) 25 MG tablet As needed for allergies.   Yes [provider]  diphenoxylate-atropine (LOMOTIL) 2.5-0.025 MG tablet TAKE ONE TABLET BY MOUTH AS NEEDED 08/03/18  Yes Sherlene Shams, MD  estradiol (ESTRACE) 0.1 MG/GM vaginal cream PLACE 1/4 APPLICATORFUL VAGINALLY DAILY. 04/26/22  Yes Sherlene Shams, MD  fluconazole (DIFLUCAN) 150 MG tablet Take 1 tablet (150 mg total) by mouth daily for 2 days. 11/29/22  Yes Sherlene Shams, MD  hyoscyamine (LEVSIN SL) 0.125 MG SL tablet PLACE 1 TABLET UNDER THE TONGUE EVERY 4 HOURS AS NEEDED FOR CRAMPING. 01/11/23  Yes Sherlene Shams, MD  levothyroxine (SYNTHROID) 50 MCG tablet TAKE 1 TABLET BY MOUTH DAILY BEFORE BREAKFAST 10/13/22  Yes Sherlene Shams,  MD  metoprolol succinate (TOPROL-XL) 25 MG 24 hr tablet Take 1 tablet (25 mg total) by mouth daily. 10/04/22  Yes Sherlene Shams, MD  nystatin-triamcinolone ointment [facility]) Apply 1 Application topically 2 (two) times daily. 01/20/23  Yes Sherlene Shams, MD  omeprazole (PRILOSEC OTC) 20 MG tablet Take 20 mg by mouth daily.   Yes [provider]  ondansetron (ZOFRAN-ODT) 4 MG disintegrating tablet Take 1 tablet (4 mg total) by mouth every 8 (eight) hours as needed for nausea or vomiting. 08/17/22  Yes Sherlene Shams, MD  promethazine (PHENERGAN) 12.5 MG tablet Take  1 tablet (12.5 mg total) by mouth every 8 (eight) hours as needed for nausea or vomiting. 08/22/19  Yes Sherlene Shams, MD  rosuvastatin (CRESTOR) 10 MG tablet Take 1 tablet (10 mg total) by mouth daily. 10/06/22  Yes Sherlene Shams, MD  scopolamine (TRANSDERM-SCOP) 1 MG/3DAYS Place 1 patch (1.5 mg total) onto the skin every 3 (three) days. 12/07/22  Yes Sherlene Shams, MD  Semaglutide,0.25 or 0.5MG /DOS, 2 MG/3ML SOPN Inject 0.5 mg into the skin once a week. 01/31/23  Yes Sherlene Shams, MD    Allergies as of 01/10/2023 - Review Complete 10/04/2022  Allergen Reaction Noted   Codeine Itching 07/21/2017   Codeine sulfate     Glucophage xr [metformin]  12/29/2020   Sulfa antibiotics  02/03/2014   Tape Rash 02/17/2017    Family History  Problem Relation Age of Onset   Hypertension Mother    Diabetes Mother    Cancer Mother        glioma   Hypertension Father    Heart disease Father 80       CAD with CABG   Cancer Father        multiple myeloma   Arthritis Sister    Hypertension Sister    Cancer Brother        Pre-cancerous lip lesion, testicular CA    Diabetes Daughter    Cancer Maternal Grandmother        Lung Ca, nonsmoker     Social History   Socioeconomic History   Marital status: Married    Spouse name: Not on file   Number of children: 2   Years of education: Not on file   Highest education level: Bachelor's degree (e.g., BA, AB, BS)  Occupational History   Occupation: Print production planner, 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   Social Determinants of Health   Financial Resource  Strain: Low Risk  (09/30/2022)   Overall Financial Resource Strain (CARDIA)    Difficulty of Paying Living Expenses: Not hard at all  Food Insecurity: No Food Insecurity (09/30/2022)   Hunger Vital Sign    Worried About Running Out of Food in the Last Year: Never true    Ran Out of Food in the Last Year: Never true  Transportation Needs: No Transportation Needs (09/30/2022)   PRAPARE - Administrator, Civil Service (Medical): No    Lack  of Transportation (Non-Medical): No  Physical Activity: Unknown (09/30/2022)   Exercise Vital Sign    Days of Exercise per Week: 0 days    Minutes of Exercise per Session: Not on file  Stress: No Stress Concern Present (09/30/2022)   [employer] of Occupational Health - Occupational Stress Questionnaire    Feeling of Stress : Only a little  Social Connections: Socially Integrated (09/30/2022)   Social Connection and Isolation Panel [NHANES]    Frequency of Communication with Friends and Family: More than three times a week    Frequency of Social Gatherings with Friends and Family: Once a week    Attends Religious Services: More than 4 times per year    Active Member of [employer] or Organizations: Yes    Attends Engineer, structural: More than 4 times per year    Marital Status: Married  Catering manager Violence: Not At Risk (05/31/2021)   Humiliation, Afraid, Rape, and Kick questionnaire    Fear of Current or Ex-Partner: No    Emotionally Abused: No    Physically Abused: No    Sexually Abused: No    Review of Systems: See HPI, otherwise negative ROS  Physical Exam: BP 130/69   Pulse 86   Temp (!) 97.5 F (36.4 C) (Temporal)   Resp 13   Ht 5\' 8"  (1.727 m)   Wt 127.7 kg   SpO2 100%   BMI 42.80 kg/m  General:   Alert, cooperative in NAD Head:  Normocephalic and atraumatic. Respiratory:  Normal work of breathing. Cardiovascular:  RRR  Impression/Plan: Volney Presser is here for cataract surgery.  Risks, benefits,  limitations, and alternatives regarding cataract surgery have been reviewed with the patient.  Questions have been answered.  All parties agreeable.   Galen Manila, MD  02/20/2023, 8:26 AM

## 2023-02-20 NOTE — Anesthesia Preprocedure Evaluation (Addendum)
Anesthesia Evaluation  Patient identified by MRN, date of birth, ID band Patient awake    Reviewed: Allergy & Precautions, H&P , NPO status , Patient's Chart, lab work & pertinent test results  Airway Mallampati: III  TM Distance: >3 FB Neck ROM: Full    Dental no notable dental hx. (+) Caps   Pulmonary neg pulmonary ROS   Pulmonary exam normal breath sounds clear to auscultation       Cardiovascular hypertension, Normal cardiovascular exam+ dysrhythmias  Rhythm:Regular Rate:Normal  05-25-17 PMH:  Pulmonary embolism, Dysrhythmia, DVT.  Risk  factors:  Hypertension.    -------------------------------------------------------------------  Study Conclusions    - Left ventricle: Wall thickness was increased in a pattern of mild    LVH. Systolic function was normal. The estimated ejection    fraction was in the range of 60% to 65%. Doppler parameters are    consistent with abnormal left ventricular relaxation (grade 1    diastolic dysfunction).  - Aortic valve: Valve area (VTI): 2.37 cm^2. Valve area (Vmax):    2.61 cm^2. Valve area (Vmean): 2.22 cm^2.  - Mitral valve: Valve area by continuity equation (using LVOT    flow): 3.04 cm^2.  - Right ventricle: The cavity size was mildly dilated.       Neuro/Psych  Headaches  Neuromuscular disease negative neurological ROS  negative psych ROS   GI/Hepatic negative GI ROS, Neg liver ROS, hiatal hernia,,,  Endo/Other  diabetesHypothyroidism    Renal/GU negative Renal ROS  negative genitourinary   Musculoskeletal negative musculoskeletal ROS (+) Arthritis ,    Abdominal   Peds negative pediatric ROS (+)  Hematology negative hematology ROS (+)   Anesthesia Other Findings Back pain Hypertension Normal cardiac stress testInternal hemorrhoidsHistory of Salmonella gastroenteritis DVT (deep venous thrombosis) (HCC) IBS (irritable bowel syndrome)HeadacheArthritis Pulmonary  embolism (HCC)Dysrhythmia Acute pulmonary embolism (HCC )Ischemic colitis (HCC) Diabetes mellitus Vertigo  Previous cataract surgery 02-14-23   Reproductive/Obstetrics negative OB ROS                              Anesthesia Physical Anesthesia Plan  ASA: 3  Anesthesia Plan: MAC   Post-op Pain Management:    Induction: Intravenous  PONV Risk Score and Plan:   Airway Management Planned: Natural Airway and Nasal Cannula  Additional Equipment:   Intra-op Plan:   Post-operative Plan:   Informed Consent: I have reviewed the patients History and Physical, chart, labs and discussed the procedure including the risks, benefits and alternatives for the proposed anesthesia with the patient or authorized representative who has indicated his/her understanding and acceptance.     Dental Advisory Given  Plan Discussed with: Anesthesiologist, CRNA and Surgeon  Anesthesia Plan Comments: (Patient consented for risks of anesthesia including but not limited to:  - adverse reactions to medications - damage to eyes, teeth, lips or other oral mucosa - nerve damage due to positioning  - sore throat or hoarseness - Damage to heart, brain, nerves, lungs, other parts of body or loss of life  Patient voiced understanding and assent.)         Anesthesia Quick Evaluation

## 2023-02-20 NOTE — Op Note (Signed)
PREOPERATIVE DIAGNOSIS:  Nuclear sclerotic cataract of the right eye.   POSTOPERATIVE DIAGNOSIS:  Nuclear sclerotic cataract of the right eye.   OPERATIVE PROCEDURE: Procedure(s): CATARACT EXTRACTION PHACO AND INTRAOCULAR LENS PLACEMENT (IOC) RIGHT CLAREON VIVITY TORIC  DIABETIC 4.68 00:36.9   SURGEON:  Galen Manila, MD.   ANESTHESIA: 1.      Managed anesthesia care. 2.     0.24ml of Shugarcaine was instilled following the paracentesis  Anesthesiologist: Marisue Humble, MD CRNA: Andee Poles, CRNA  COMPLICATIONS:  None.   TECHNIQUE:   Stop and chop    DESCRIPTION OF PROCEDURE:  The patient was examined and consented in the preoperative holding area where the aforementioned topical anesthesia was applied to the right eye.  The patient was brought back to the Operating Room where he was sat upright on the gurney and given a target to fixate upon while the eye was marked at the 3:00 and 9:00 position.  The patient was then reclined on the operating table.  The eye was prepped and draped in the usual sterile ophthalmic fashion and a lid speculum was placed. A paracentesis was created with the side port blade and the anterior chamber was filled with viscoelastic. A near clear corneal incision was performed with the steel keratome. A continuous curvilinear capsulorrhexis was performed with a cystotome followed by the capsulorrhexis forceps. Hydrodissection and hydrodelineation were carried out with BSS on a blunt cannula. The lens was removed in a stop and chop technique and the remaining cortical material was removed with the irrigation-aspiration handpiece. The eye was inflated with viscoelastic and the ZCT  lens  was placed in the eye and rotated to within a few degrees of the predetermined orientation.  The remaining viscoelastic was removed from the eye.  The Sinskey hook was used to rotate the toric lens into its final resting place at 070 degrees.  0. The eye was inflated to a physiologic  pressure and found to be watertight. 0.37ml of Vigamox was placed in the anterior chamber.  The eye was dressed with Vigamox.and Combigan. The patient was given protective glasses to wear throughout the day and a shield with which to sleep tonight. The patient was also given drops with which to begin a drop regimen today and will follow-up with me in one day. Implant Name Type Inv. Item Serial No. Manufacturer Lot No. LRB No. Used Action  LENS CLAREON VIVITY TORIC 14.0 - [id number]  LENS CLAREON VIVITY TORIC 14.0 [id number] [employer]  Right 1 Implanted   Procedure(s): CATARACT EXTRACTION PHACO AND INTRAOCULAR LENS PLACEMENT (IOC) RIGHT CLAREON VIVITY TORIC  DIABETIC 4.68 00:36.9 (Right)  Electronically signed: Galen Manila 02/20/2023 10:42 AM

## 2023-02-20 NOTE — Transfer of Care (Signed)
Immediate Anesthesia Transfer of Care Note  Patient: Tiffany Wells  Procedure(s) Performed: CATARACT EXTRACTION PHACO AND INTRAOCULAR LENS PLACEMENT (IOC) RIGHT CLAREON VIVITY TORIC  DIABETIC 4.68 00:36.9 (Right: Eye)  Patient Location: PACU  Anesthesia Type: MAC  Level of Consciousness: awake, alert  and patient cooperative  Airway and Oxygen Therapy: Patient Spontanous Breathing and Patient connected to supplemental oxygen  Post-op Assessment: Post-op Vital signs reviewed, Patient's Cardiovascular Status Stable, Respiratory Function Stable, Patent Airway and No signs of Nausea or vomiting  Post-op Vital Signs: Reviewed and stable  Complications: No notable events documented.

## 2023-02-20 NOTE — Anesthesia Postprocedure Evaluation (Signed)
Anesthesia Post Note  Patient: Tiffany Wells  Procedure(s) Performed: CATARACT EXTRACTION PHACO AND INTRAOCULAR LENS PLACEMENT (IOC) RIGHT CLAREON VIVITY TORIC  DIABETIC 4.68 00:36.9 (Right: Eye)  Patient location during evaluation: PACU Anesthesia Type: MAC Level of consciousness: awake and alert Pain management: pain level controlled Vital Signs Assessment: post-procedure vital signs reviewed and stable Respiratory status: spontaneous breathing, nonlabored ventilation, respiratory function stable and patient connected to nasal cannula oxygen Cardiovascular status: stable and blood pressure returned to baseline Postop Assessment: no apparent nausea or vomiting Anesthetic complications: no   No notable events documented.   Last Vitals:  Vitals:   02/20/23 1043 02/20/23 1048  BP: 117/72 (!) 127/57  Pulse: 90 87  Resp: 12 16  Temp: (!) 36.1 C (!) 36.1 C  SpO2: 96% 95%    Last Pain:  Vitals:   02/20/23 1048  TempSrc:   PainSc: 0-No pain                 Noella Kipnis C Odysseus Cada

## 2023-02-21 ENCOUNTER — Encounter: Payer: Self-pay | Admitting: Ophthalmology

## 2023-02-22 ENCOUNTER — Other Ambulatory Visit: Payer: Self-pay

## 2023-02-22 MED ORDER — CYCLOBENZAPRINE HCL 10 MG PO TABS
10.0000 mg | ORAL_TABLET | Freq: Every day | ORAL | 1 refills | Status: DC
  Filled 2023-02-22: qty 90, 90d supply, fill #0
  Filled 2023-05-24: qty 90, 90d supply, fill #1

## 2023-02-23 ENCOUNTER — Other Ambulatory Visit: Payer: Self-pay

## 2023-03-27 ENCOUNTER — Other Ambulatory Visit: Payer: Self-pay

## 2023-03-31 ENCOUNTER — Other Ambulatory Visit (HOSPITAL_BASED_OUTPATIENT_CLINIC_OR_DEPARTMENT_OTHER): Payer: Self-pay

## 2023-04-03 ENCOUNTER — Other Ambulatory Visit: Payer: Medicare Other

## 2023-04-03 ENCOUNTER — Other Ambulatory Visit: Payer: Self-pay | Admitting: Internal Medicine

## 2023-04-03 ENCOUNTER — Ambulatory Visit
Admission: RE | Admit: 2023-04-03 | Discharge: 2023-04-03 | Disposition: A | Discharge status: Home / Self Care | Payer: Medicare Other | Attending: Internal Medicine | Admitting: Internal Medicine

## 2023-04-03 ENCOUNTER — Ambulatory Visit (INDEPENDENT_AMBULATORY_CARE_PROVIDER_SITE_OTHER): Payer: Medicare Other | Admitting: Internal Medicine

## 2023-04-03 ENCOUNTER — Encounter: Payer: Self-pay | Admitting: Internal Medicine

## 2023-04-03 ENCOUNTER — Ambulatory Visit
Admission: RE | Admit: 2023-04-03 | Discharge: 2023-04-03 | Disposition: A | Discharge status: Home / Self Care | Payer: Medicare Other | Source: Ambulatory Visit | Attending: Internal Medicine | Admitting: Internal Medicine

## 2023-04-03 VITALS — BP 130/78 | HR 113 | Ht 68.0 in | Wt 282.4 lb

## 2023-04-03 DIAGNOSIS — K5903 Drug induced constipation: Secondary | ICD-10-CM

## 2023-04-03 DIAGNOSIS — R1030 Lower abdominal pain, unspecified: Secondary | ICD-10-CM

## 2023-04-03 DIAGNOSIS — R1031 Right lower quadrant pain: Secondary | ICD-10-CM

## 2023-04-03 DIAGNOSIS — R1032 Left lower quadrant pain: Secondary | ICD-10-CM

## 2023-04-03 LAB — CBC WITH DIFFERENTIAL/PLATELET
Basophils Absolute: 0.1 10*3/uL (ref 0.0–0.1)
Basophils Relative: 0.7 % (ref 0.0–3.0)
Eosinophils Absolute: 0.1 10*3/uL (ref 0.0–0.7)
Eosinophils Relative: 1.1 % (ref 0.0–5.0)
HCT: 42.5 % (ref 36.0–46.0)
Hemoglobin: 13.9 g/dL (ref 12.0–15.0)
Lymphocytes Relative: 19.8 % (ref 12.0–46.0)
Lymphs Abs: 2.3 10*3/uL (ref 0.7–4.0)
MCHC: 32.7 g/dL (ref 30.0–36.0)
MCV: 83.6 fL (ref 78.0–100.0)
Monocytes Absolute: 0.9 10*3/uL (ref 0.1–1.0)
Monocytes Relative: 8 % (ref 3.0–12.0)
Neutro Abs: 8.2 10*3/uL — ABNORMAL HIGH (ref 1.4–7.7)
Neutrophils Relative %: 70.4 % (ref 43.0–77.0)
Platelets: 223 10*3/uL (ref 150.0–400.0)
RBC: 5.08 Mil/uL (ref 3.87–5.11)
RDW: 14.1 % (ref 11.5–15.5)
WBC: 11.7 10*3/uL — ABNORMAL HIGH (ref 4.0–10.5)

## 2023-04-03 LAB — COMPREHENSIVE METABOLIC PANEL
ALT: 51 U/L — ABNORMAL HIGH (ref 0–35)
AST: 45 U/L — ABNORMAL HIGH (ref 0–37)
Albumin: 3.7 g/dL (ref 3.5–5.2)
Alkaline Phosphatase: 74 U/L (ref 39–117)
BUN: 13 mg/dL (ref 6–23)
CO2: 29 meq/L (ref 19–32)
Calcium: 8.6 mg/dL (ref 8.4–10.5)
Chloride: 100 meq/L (ref 96–112)
Creatinine, Ser: 0.96 mg/dL (ref 0.40–1.20)
GFR: 60.39 mL/min (ref >60.00)
Glucose, Bld: 132 mg/dL — ABNORMAL HIGH (ref 70–99)
Potassium: 3.9 meq/L (ref 3.5–5.1)
Sodium: 137 meq/L (ref 135–145)
Total Bilirubin: 0.5 mg/dL (ref 0.2–1.2)
Total Protein: 6.5 g/dL (ref 6.0–8.3)

## 2023-04-03 LAB — SEDIMENTATION RATE: Sed Rate: 18 mm/h (ref 0–30)

## 2023-04-03 LAB — LIPASE: Lipase: 9 U/L — ABNORMAL LOW (ref 11.0–59.0)

## 2023-04-03 LAB — C-REACTIVE PROTEIN: CRP: 9.8 mg/dL (ref 0.5–20.0)

## 2023-04-03 MED ORDER — GOLYTELY 236 G PO SOLR: ORAL | 0 refills | Status: DC

## 2023-04-03 NOTE — Progress Notes (Signed)
Subjective:  Patient ID: Tiffany Wells, female    DOB: 28-Dec-1955  Age: 67 y.o. MRN: [id number]  CC: The encounter diagnosis was Bilateral lower abdominal pain.   HPI JENNIA KIEL presents for  Chief Complaint  Patient presents with   Medical Management of Chronic Issues    67 yr old female with history of diverticulosis (by 2023 CT,  no itis)  history of ischemic colitis,  and current Ozempic use for mgmt of diabetes presents with constipation and one day history of severe  lower abdominal crampy pain, bilateral, and new onset passing of mucous per rectum.  . Last BM was small on Saturday  Pain started on Sunday,  no BM or flatulence  .  Monday started passing gas and having mucoid discharge without feces or blood from rectum . Had several episodes of nausea without vomiting on Sunday when pain was most severe,  none today.  No fevers  Has been maintaining oral hydration.     Outpatient Medications Prior to Visit  Medication Sig Dispense Refill   clindamycin (CLEOCIN T) 1 % external solution Apply to affected sites on scalp twice a day until clear, then as needed 30 mL 2   cyclobenzaprine (FLEXERIL) 10 MG tablet Take 1 tablet (10 mg total) by mouth at bedtime. 90 tablet 1   diltiazem (CARDIZEM CD) 240 MG 24 hr capsule Take 1 capsule (240 mg total) by mouth daily. 90 capsule 3   diltiazem (CARDIZEM) 30 MG tablet Take 1 tablet (30 mg total) by mouth daily as needed (SVT). 30 tablet 3   diphenhydrAMINE (BENADRYL) 25 MG tablet As needed for allergies.     diphenoxylate-atropine (LOMOTIL) 2.5-0.025 MG tablet TAKE ONE TABLET BY MOUTH AS NEEDED 30 tablet 2   estradiol (ESTRACE) 0.1 MG/GM vaginal cream PLACE 1/4 APPLICATORFUL VAGINALLY DAILY. 42.5 g 9   fluconazole (DIFLUCAN) 150 MG tablet Take 1 tablet (150 mg total) by mouth daily for 2 days. 2 tablet 2   hyoscyamine (LEVSIN SL) 0.125 MG SL tablet PLACE 1 TABLET UNDER THE TONGUE EVERY 4 HOURS AS NEEDED FOR CRAMPING. 60 tablet 5    levothyroxine (SYNTHROID) 50 MCG tablet TAKE 1 TABLET BY MOUTH DAILY BEFORE BREAKFAST 90 tablet 3   metoprolol succinate (TOPROL-XL) 25 MG 24 hr tablet Take 1 tablet (25 mg total) by mouth daily. 90 tablet 1   nystatin-triamcinolone ointment (MYCOLOG) Apply 1 Application topically 2 (two) times daily. 30 g 2   omeprazole (PRILOSEC OTC) 20 MG tablet Take 20 mg by mouth daily.     ondansetron (ZOFRAN-ODT) 4 MG disintegrating tablet Take 1 tablet (4 mg total) by mouth every 8 (eight) hours as needed for nausea or vomiting. 20 tablet 0   promethazine (PHENERGAN) 12.5 MG tablet Take 1 tablet (12.5 mg total) by mouth every 8 (eight) hours as needed for nausea or vomiting. 20 tablet 0   rosuvastatin (CRESTOR) 10 MG tablet Take 1 tablet (10 mg total) by mouth daily. 90 tablet 1   scopolamine (TRANSDERM-SCOP) 1 MG/3DAYS Place 1 patch (1.5 mg total) onto the skin every 3 (three) days. 10 patch 0   Semaglutide,0.25 or 0.5MG /DOS, 2 MG/3ML SOPN Inject 0.5 mg into the skin once a week. 9 mL 0   No facility-administered medications prior to visit.    Review of Systems;  Patient denies headache, fevers, malaise, unintentional weight loss, skin rash, eye pain, sinus congestion and sinus pain, sore throat, dysphagia,  hemoptysis , cough, dyspnea, wheezing, chest pain, palpitations,  orthopnea, edema, , melena, diarrhea,  flank pain, dysuria, hematuria, urinary  Frequency, nocturia, numbness, tingling, seizures,  Focal weakness, Loss of consciousness,  Tremor, insomnia, depression, anxiety, and suicidal ideation.      Objective:  BP 130/78   Pulse (!) 113   Ht 5\' 8"  (1.727 m)   Wt 282 lb 6.4 oz (128.1 kg)   SpO2 96%   BMI 42.94 kg/m   BP Readings from Last 3 Encounters:  04/03/23 130/78  02/20/23 (!) 127/57  02/14/23 132/78    Wt Readings from Last 3 Encounters:  04/03/23 282 lb 6.4 oz (128.1 kg)  02/20/23 281 lb 8 oz (127.7 kg)  02/14/23 280 lb (127 kg)    Physical Exam Vitals reviewed.   Constitutional:      General: She is not in acute distress.    Appearance: Normal appearance. She is normal weight. She is not ill-appearing, toxic-appearing or diaphoretic.  HENT:     Head: Normocephalic.  Eyes:     General: No scleral icterus.       Right eye: No discharge.        Left eye: No discharge.     Conjunctiva/sclera: Conjunctivae normal.  Cardiovascular:     Rate and Rhythm: Normal rate and regular rhythm.  Pulmonary:     Effort: Pulmonary effort is normal.     Breath sounds: Normal breath sounds.  Abdominal:     General: Bowel sounds are decreased.     Palpations: Abdomen is soft.     Tenderness: There is abdominal tenderness in the right lower quadrant, suprapubic area and left lower quadrant. There is no guarding or rebound.  Musculoskeletal:        General: Normal range of motion.  Skin:    General: Skin is warm and dry.  Neurological:     General: No focal deficit present.     Mental Status: She is alert and oriented to person, place, and time. Mental status is at baseline.  Psychiatric:        Mood and Affect: Mood normal.        Behavior: Behavior normal.        Thought Content: Thought content normal.        Judgment: Judgment normal.    Lab Results  Component Value Date   HGBA1C 6.2 01/06/2023   HGBA1C 6.1 10/04/2022   HGBA1C 6.2 (A) 07/04/2022    Lab Results  Component Value Date   CREATININE 0.96 04/03/2023   CREATININE 0.87 01/06/2023   CREATININE 0.94 10/04/2022    Lab Results  Component Value Date   WBC 11.7 (H) 04/03/2023   HGB 13.9 04/03/2023   HCT 42.5 04/03/2023   PLT 223.0 04/03/2023   GLUCOSE 132 (H) 04/03/2023   CHOL 147 01/06/2023   TRIG 171 (H) 01/06/2023   HDL 45 (L) 01/06/2023   LDLDIRECT 100.0 10/04/2022   LDLCALC 75 01/06/2023   ALT 51 (H) 04/03/2023   AST 45 (H) 04/03/2023   NA 137 04/03/2023   K 3.9 04/03/2023   CL 100 04/03/2023   CREATININE 0.96 04/03/2023   BUN 13 04/03/2023   CO2 29 04/03/2023   TSH  2.72 01/06/2023   INR 1.1 02/13/2014   HGBA1C 6.2 01/06/2023   MICROALBUR 0.7 07/11/2022    No results found.  Assessment & Plan:  .Bilateral lower abdominal pain Assessment & Plan: Exam, history and plain films done today consistent with severe constipation. Patient has history of diverticulosis,  IBS and ischemic colitis  and is taking ozempic.  Advised to susepdn Ozempic for 2 weeks.  Polyethylene glycol osmotic laxative prescribed and advised to use glycerin suppositories as wel. .  Continue use of levsin and dicylomine prn pain . Patient will advise if she develops fever,  vomiting bloody discharge and STAT CT abd pelvis will be ordered    Other orders -     Golytely; Drink 8 ounces every  30 minutes until constipation has resolved.  Dispense: 4000 mL; Refill: 0     I provided 30 minutes on the day of this face-to-face time during this encounter reviewing patient's last hospitalization  previous surgical and non surgical procedures, previous  labs and imaging studies, counseling on currently addressed issues,  and post visit review of diagnostics and ordering of therapeutics .   Follow-up: No follow-ups on file.   Sherlene Shams, MD

## 2023-04-04 DIAGNOSIS — R1031 Right lower quadrant pain: Secondary | ICD-10-CM | POA: Insufficient documentation

## 2023-04-04 NOTE — Assessment & Plan Note (Signed)
Exam, history and plain films done today consistent with severe constipation. Patient has history of diverticulosis,  IBS and ischemic colitis and is taking ozempic.  Advised to susepdn Ozempic for 2 weeks.  Polyethylene glycol osmotic laxative prescribed and advised to use glycerin suppositories as wel. .  Continue use of levsin and dicylomine prn pain . Patient will advise if Tiffany Wells develops fever,  vomiting bloody discharge and STAT CT abd pelvis will be ordered

## 2023-04-05 ENCOUNTER — Other Ambulatory Visit
Admission: RE | Admit: 2023-04-05 | Discharge: 2023-04-05 | Disposition: A | Discharge status: Home / Self Care | Payer: Medicare Other | Source: Ambulatory Visit | Attending: Internal Medicine | Admitting: Internal Medicine

## 2023-04-05 ENCOUNTER — Other Ambulatory Visit: Payer: Self-pay | Admitting: Internal Medicine

## 2023-04-05 ENCOUNTER — Ambulatory Visit
Admission: RE | Admit: 2023-04-05 | Discharge: 2023-04-05 | Disposition: A | Discharge status: Home / Self Care | Payer: Medicare Other | Source: Ambulatory Visit | Attending: Internal Medicine | Admitting: Internal Medicine

## 2023-04-05 DIAGNOSIS — D72823 Leukemoid reaction: Secondary | ICD-10-CM | POA: Insufficient documentation

## 2023-04-05 DIAGNOSIS — R1031 Right lower quadrant pain: Secondary | ICD-10-CM | POA: Insufficient documentation

## 2023-04-05 DIAGNOSIS — Z8719 Personal history of other diseases of the digestive system: Secondary | ICD-10-CM

## 2023-04-05 DIAGNOSIS — R1032 Left lower quadrant pain: Secondary | ICD-10-CM | POA: Insufficient documentation

## 2023-04-05 DIAGNOSIS — K5900 Constipation, unspecified: Secondary | ICD-10-CM

## 2023-04-05 DIAGNOSIS — K5790 Diverticulosis of intestine, part unspecified, without perforation or abscess without bleeding: Secondary | ICD-10-CM

## 2023-04-05 DIAGNOSIS — K529 Noninfective gastroenteritis and colitis, unspecified: Secondary | ICD-10-CM

## 2023-04-05 LAB — CBC WITH DIFFERENTIAL/PLATELET
Abs Immature Granulocytes: 0.06 10*3/uL (ref 0.00–0.07)
Basophils Absolute: 0.1 10*3/uL (ref 0.0–0.1)
Basophils Relative: 1 %
Eosinophils Absolute: 0.1 10*3/uL (ref 0.0–0.5)
Eosinophils Relative: 1 %
HCT: 41.8 % (ref 36.0–46.0)
Hemoglobin: 13.9 g/dL (ref 12.0–15.0)
Immature Granulocytes: 0 %
Lymphocytes Relative: 18 %
Lymphs Abs: 2.5 10*3/uL (ref 0.7–4.0)
MCH: 27.4 pg (ref 26.0–34.0)
MCHC: 33.3 g/dL (ref 30.0–36.0)
MCV: 82.4 fL (ref 80.0–100.0)
Monocytes Absolute: 1.1 10*3/uL — ABNORMAL HIGH (ref 0.1–1.0)
Monocytes Relative: 8 %
Neutro Abs: 10.2 10*3/uL — ABNORMAL HIGH (ref 1.7–7.7)
Neutrophils Relative %: 72 %
Platelets: 255 10*3/uL (ref 150–400)
RBC: 5.07 MIL/uL (ref 3.87–5.11)
RDW: 13.2 % (ref 11.5–15.5)
WBC: 14.1 10*3/uL — ABNORMAL HIGH (ref 4.0–10.5)
nRBC: 0 % (ref 0.0–0.2)

## 2023-04-05 LAB — SEDIMENTATION RATE: Sed Rate: 16 mm/h (ref 0–30)

## 2023-04-05 MED ORDER — IOHEXOL 9 MG/ML PO SOLN
500.0000 mL | ORAL | Status: AC
  Administered 2023-04-05 (×2): 500 mL via ORAL

## 2023-04-05 MED ORDER — HYDROMORPHONE HCL 2 MG PO TABS: 2.0000 mg | ORAL_TABLET | ORAL | 0 refills | Status: DC | PRN

## 2023-04-05 MED ORDER — IOHEXOL 300 MG/ML  SOLN
100.0000 mL | Freq: Once | INTRAMUSCULAR | Status: AC | PRN
  Administered 2023-04-05: 100 mL via INTRAVENOUS

## 2023-04-05 MED ORDER — KETOROLAC TROMETHAMINE 10 MG PO TABS: 10.0000 mg | ORAL_TABLET | Freq: Four times a day (QID) | ORAL | 0 refills | Status: DC | PRN

## 2023-04-05 NOTE — Assessment & Plan Note (Addendum)
Suggested by CT abd pelvis done Dec 18  due to increased pain overnight and less than anticipated fecal output despite use of bowel cathartic and stimulant laxative.  Repeating CBC, ESR CRP.  ,  sending toradol and dilaudid to [facility] for pain management

## 2023-04-06 LAB — C-REACTIVE PROTEIN: CRP: 8.9 mg/dL — ABNORMAL HIGH (ref <1.0)

## 2023-04-09 ENCOUNTER — Other Ambulatory Visit: Payer: Self-pay | Admitting: Internal Medicine

## 2023-04-09 DIAGNOSIS — I471 Supraventricular tachycardia, unspecified: Secondary | ICD-10-CM

## 2023-04-25 ENCOUNTER — Other Ambulatory Visit: Payer: Self-pay | Admitting: Internal Medicine

## 2023-04-25 DIAGNOSIS — J988 Other specified respiratory disorders: Secondary | ICD-10-CM

## 2023-04-26 ENCOUNTER — Ambulatory Visit: Payer: Medicare Other

## 2023-04-26 ENCOUNTER — Ambulatory Visit (INDEPENDENT_AMBULATORY_CARE_PROVIDER_SITE_OTHER): Payer: Medicare Other | Admitting: Internal Medicine

## 2023-04-26 VITALS — BP 118/72 | HR 100 | Temp 98.0°F | Resp 16 | Ht 68.0 in | Wt 279.0 lb

## 2023-04-26 DIAGNOSIS — R051 Acute cough: Secondary | ICD-10-CM

## 2023-04-26 DIAGNOSIS — J22 Unspecified acute lower respiratory infection: Secondary | ICD-10-CM | POA: Diagnosis not present

## 2023-04-26 DIAGNOSIS — B348 Other viral infections of unspecified site: Secondary | ICD-10-CM | POA: Diagnosis not present

## 2023-04-26 DIAGNOSIS — J40 Bronchitis, not specified as acute or chronic: Secondary | ICD-10-CM

## 2023-04-26 MED ORDER — FLUCONAZOLE 150 MG PO TABS: 150.0000 mg | ORAL_TABLET | Freq: Every day | ORAL | 0 refills | Status: DC

## 2023-04-26 MED ORDER — PREDNISONE 10 MG PO TABS: ORAL_TABLET | ORAL | 0 refills | Status: DC

## 2023-04-26 MED ORDER — LEVALBUTEROL TARTRATE 45 MCG/ACT IN AERO: 1.0000 | INHALATION_SPRAY | Freq: Three times a day (TID) | RESPIRATORY_TRACT | 12 refills | Status: DC | PRN

## 2023-04-26 MED ORDER — AZITHROMYCIN 500 MG PO TABS
500.0000 mg | ORAL_TABLET | Freq: Every day | ORAL | 0 refills | Status: DC
Start: 2023-04-26 — End: 2023-06-16

## 2023-04-26 MED ORDER — HYDROCOD POLI-CHLORPHE POLI ER 10-8 MG/5ML PO SUER: 5.0000 mL | Freq: Two times a day (BID) | ORAL | 0 refills | Status: DC | PRN

## 2023-04-26 NOTE — Progress Notes (Addendum)
 Subjective:  Patient ID: Tiffany Wells, female    DOB: 05-Dec-1955  Age: 68 y.o. MRN: [id number]  CC: The primary encounter diagnosis was Acute cough. Diagnoses of Lower respiratory infection, Bronchitis, and Infection due to parainfluenza virus 3 were also pertinent to this visit.   HPI Tiffany Wells presents for  Chief Complaint  Patient presents with   Cough   SYMPTOMS OF RHINITIS,  COUGH SINCE JAN 3 . HUSBAND ALSO ILL SINCE JAN 1 .  Symptoms included t to 100.2,  wheezing,   drops in sats to 91% . Using tessalon  perles during day and tussionex at night.  No signficant change since Saturday    Outpatient Medications Prior to Visit  Medication Sig Dispense Refill   clindamycin  (CLEOCIN  T) 1 % external solution Apply to affected sites on scalp twice a day until clear, then as needed 30 mL 2   cyclobenzaprine  (FLEXERIL ) 10 MG tablet Take 1 tablet (10 mg total) by mouth at bedtime. 90 tablet 1   diltiazem  (CARDIZEM  CD) 240 MG 24 hr capsule Take 1 capsule (240 mg total) by mouth daily. 90 capsule 3   diltiazem  (CARDIZEM ) 30 MG tablet Take 1 tablet (30 mg total) by mouth daily as needed (SVT). 30 tablet 3   diphenhydrAMINE (BENADRYL) 25 MG tablet As needed for allergies.     diphenoxylate -atropine  (LOMOTIL ) 2.5-0.025 MG tablet TAKE ONE TABLET BY MOUTH AS NEEDED 30 tablet 2   estradiol  (ESTRACE ) 0.1 MG/GM vaginal cream PLACE 1/4 APPLICATORFUL VAGINALLY DAILY. 42.5 g 9   fluconazole  (DIFLUCAN ) 150 MG tablet Take 1 tablet (150 mg total) by mouth daily for 2 days. 2 tablet 2   hyoscyamine  (LEVSIN  SL) 0.125 MG SL tablet PLACE 1 TABLET UNDER THE TONGUE EVERY 4 HOURS AS NEEDED FOR CRAMPING. 60 tablet 5   levothyroxine  (SYNTHROID ) 50 MCG tablet TAKE 1 TABLET BY MOUTH DAILY BEFORE BREAKFAST 90 tablet 3   metoprolol  succinate (TOPROL -XL) 25 MG 24 hr tablet TAKE 1 TABLET (25 MG TOTAL) BY MOUTH DAILY. 90 tablet 1   nystatin -triamcinolone  ointment (MYCOLOG) Apply 1 Application topically 2 (two)  times daily. 30 g 2   omeprazole (PRILOSEC OTC) 20 MG tablet Take 20 mg by mouth daily.     ondansetron  (ZOFRAN -ODT) 4 MG disintegrating tablet Take 1 tablet (4 mg total) by mouth every 8 (eight) hours as needed for nausea or vomiting. 20 tablet 0   polyethylene glycol (GOLYTELY ) 236 g solution Drink 8 ounces every  30 minutes until constipation has resolved. 4000 mL 0   promethazine  (PHENERGAN ) 12.5 MG tablet Take 1 tablet (12.5 mg total) by mouth every 8 (eight) hours as needed for nausea or vomiting. 20 tablet 0   rosuvastatin  (CRESTOR ) 10 MG tablet Take 1 tablet (10 mg total) by mouth daily. 90 tablet 1   Semaglutide ,0.25 or 0.5MG /DOS, 2 MG/3ML SOPN Inject 0.5 mg into the skin once a week. 9 mL 0   HYDROmorphone  (DILAUDID ) 2 MG tablet Take 1 tablet (2 mg total) by mouth every 4 (four) hours as needed for severe pain (pain score 7-10). 20 tablet 0   ketorolac  (TORADOL ) 10 MG tablet Take 1 tablet (10 mg total) by mouth every 6 (six) hours as needed. 20 tablet 0   No facility-administered medications prior to visit.    Review of Systems;  Patient denies headache, fevers, malaise, unintentional weight loss, skin rash, eye pain, sinus congestion and sinus pain, sore throat, dysphagia,  hemoptysis , cough, dyspnea, wheezing, chest pain, palpitations,  orthopnea, edema, abdominal pain, nausea, melena, diarrhea, constipation, flank pain, dysuria, hematuria, urinary  Frequency, nocturia, numbness, tingling, seizures,  Focal weakness, Loss of consciousness,  Tremor, insomnia, depression, anxiety, and suicidal ideation.      Objective:  BP 118/72   Pulse 100   Temp 98 F (36.7 C)   Resp 16   Ht 5' 8 (1.727 m)   Wt 279 lb (126.6 kg)   SpO2 98%   BMI 42.42 kg/m   BP Readings from Last 3 Encounters:  04/26/23 118/72  04/03/23 130/78  02/20/23 (!) 127/57    Wt Readings from Last 3 Encounters:  04/26/23 279 lb (126.6 kg)  04/03/23 282 lb 6.4 oz (128.1 kg)  02/20/23 281 lb 8 oz (127.7 kg)     Physical Exam Vitals reviewed.  Constitutional:      General: She is not in acute distress.    Appearance: Normal appearance. She is normal weight. She is not ill-appearing, toxic-appearing or diaphoretic.  HENT:     Head: Normocephalic.  Eyes:     General: No scleral icterus.       Right eye: No discharge.        Left eye: No discharge.     Conjunctiva/sclera: Conjunctivae normal.  Cardiovascular:     Rate and Rhythm: Normal rate and regular rhythm.     Heart sounds: Normal heart sounds.  Pulmonary:     Effort: Pulmonary effort is normal.     Breath sounds: Examination of the right-upper field reveals wheezing. Examination of the left-upper field reveals rhonchi. Examination of the right-lower field reveals wheezing. Examination of the left-lower field reveals rhonchi. Wheezing and rhonchi present.    Musculoskeletal:        General: Normal range of motion.  Skin:    General: Skin is warm and dry.  Neurological:     General: No focal deficit present.     Mental Status: She is alert and oriented to person, place, and time. Mental status is at baseline.  Psychiatric:        Mood and Affect: Mood normal.        Behavior: Behavior normal.        Thought Content: Thought content normal.        Judgment: Judgment normal.    Lab Results  Component Value Date   HGBA1C 6.2 01/06/2023   HGBA1C 6.1 10/04/2022   HGBA1C 6.2 (A) 07/04/2022    Lab Results  Component Value Date   CREATININE 0.96 04/03/2023   CREATININE 0.87 01/06/2023   CREATININE 0.94 10/04/2022    Lab Results  Component Value Date   WBC 14.1 (H) 04/05/2023   HGB 13.9 04/05/2023   HCT 41.8 04/05/2023   PLT 255 04/05/2023   GLUCOSE 132 (H) 04/03/2023   CHOL 147 01/06/2023   TRIG 171 (H) 01/06/2023   HDL 45 (L) 01/06/2023   LDLDIRECT 100.0 10/04/2022   LDLCALC 75 01/06/2023   ALT 51 (H) 04/03/2023   AST 45 (H) 04/03/2023   NA 137 04/03/2023   K 3.9 04/03/2023   CL 100 04/03/2023   CREATININE  0.96 04/03/2023   BUN 13 04/03/2023   CO2 29 04/03/2023   TSH 2.72 01/06/2023   INR 1.1 02/13/2014   HGBA1C 6.2 01/06/2023   MICROALBUR 0.7 07/11/2022    CT ABDOMEN PELVIS W CONTRAST Result Date: 04/05/2023 CLINICAL DATA:  Acute abdominal pain EXAM: CT ABDOMEN AND PELVIS WITH CONTRAST TECHNIQUE: Multidetector CT imaging of the abdomen and pelvis was  performed using the standard protocol following bolus administration of intravenous contrast. RADIATION DOSE REDUCTION: This exam was performed according to the departmental dose-optimization program which includes automated exposure control, adjustment of the mA and/or kV according to patient size and/or use of iterative reconstruction technique. CONTRAST:  OMNIPAQUE  IOHEXOL  300 MG/ML  SOLN COMPARISON:  10/28/2021 FINDINGS: Lower chest: Mitral and aortic valve calcifications. Descending thoracic aortic atheromatous vascular calcifications. Hepatobiliary: Suspected hepatic steatosis. Gallbladder unremarkable. No biliary dilatation. No significant focal parenchymal lesion identified. Pancreas: Unremarkable Spleen: Unremarkable Adrenals/Urinary Tract: The kidneys and adrenal glands appear normal. The urinary bladder extends about 2.5 cm below the pubococcygeal line, compatible with cystocele and pelvic floor laxity. Stomach/Bowel: There are few scattered sigmoid colon diverticula. Mild wall thickening in the distal sigmoid colon compatible with colitis or less likely mild diverticulitis, with adjacent edema in the mesentery of the affected segment and a trace amount of adjacent free pelvic fluid as shown for example on image 116 series 4. The appendix is mildly abnormal, measuring up to 9 mm in diameter with low-density along most of the appendix, except for higher density in the appendiceal tip. No surrounding periappendiceal inflammatory findings. These findings are abnormal but nonspecific and could be a manifestation of very low-grade appendicitis or  possibly a mucocele or small appendiceal tip mass. However, the appendix appeared normal in size and density on 10/28/2021. Vascular/Lymphatic: Atherosclerosis is present, including aortoiliac atherosclerotic disease. Atheromatous plaque dorsally in the celiac trunk and SMA without occlusion or high-grade stenosis. Reproductive: Low position of the cervix partially extending below the pubococcygeal line, compatible with pelvic floor laxity. Other: No supplemental non-categorized findings. Musculoskeletal: Lower thoracic spondylosis. Pelvic floor laxity is noted above. IMPRESSION: 1. Mild wall thickening in the distal sigmoid colon compatible with colitis or less likely mild diverticulitis, with adjacent edema in the mesentery of the affected segment and a trace amount of adjacent free pelvic fluid. 2. The appendix is mildly abnormal, measuring up to 9 mm in diameter with low-density along most of the appendix, except for higher density in the appendiceal tip. No surrounding periappendiceal inflammatory findings. These findings are abnormal but nonspecific and could be a manifestation of very low-grade appendicitis or possibly a mucocele or small appendiceal tip mass. However, the appendix appeared normal in size and density on 10/28/2021. 3. Pelvic floor laxity with cystocele and low position of the cervix partially extending below the pubococcygeal line. 4. Suspected hepatic steatosis. 5. Mitral and aortic valve calcifications. 6. Aortic atherosclerosis. Aortic Atherosclerosis (ICD10-I70.0). Electronically Signed   By: Ryan Salvage M.D.   On: 04/05/2023 14:43    Assessment & Plan:  .Acute cough -     POCT Influenza A/B -     POC COVID-19 BinaxNow -     Respiratory virus panel -     DG Chest 2 View; Future  Lower respiratory infection Assessment & Plan: Rapid flu and COVUD are negative.  RSV pending.  Azithromycin  and prednisone  prescribed empirically ,  will add staph aureus coverage if cxr shows  an infiltrate .  Xopenex  for wheezing .tussionex refilled    Bronchitis Assessment & Plan: Lung exam is notable for ronchi without egophony  Prednisone  taper,  empiric antibiotics, XOPENEX  INHALER .  Tussionex and benzonotate for cough control.   SABRA COVID AND FLU RAPID TESTS  NEGATIVE  RSV negative,  parainfluenza Virus 3 positive    Infection due to parainfluenza virus 3  Other orders -     predniSONE ; 6 tablets on Day  1 , then reduce by 1 tablet daily until gone  Dispense: 21 tablet; Refill: 0 -     Levalbuterol  Tartrate; Inhale 1-2 puffs into the lungs every 8 (eight) hours as needed for wheezing.  Dispense: 1 each; Refill: 12 -     Azithromycin ; Take 1 tablet (500 mg total) by mouth daily.  Dispense: 7 tablet; Refill: 0 -     Fluconazole ; Take 1 tablet (150 mg total) by mouth daily.  Dispense: 2 tablet; Refill: 0 -     Hydrocod Poli-Chlorphe Poli ER; Take 5 mLs by mouth every 12 (twelve) hours as needed for cough.  Dispense: 140 mL; Refill: 0     I provided 30 minutes of face-to-face time during this encounter reviewing patient's last visit with me, patient's  most recent visit with cardiology,  nephrology,  and neurology,  recent surgical and non surgical procedures, previous  labs and imaging studies, counseling on currently addressed issues,  and post visit ordering to diagnostics and therapeutics .   Follow-up: No follow-ups on file.   Verneita LITTIE Kettering, MD

## 2023-04-26 NOTE — Assessment & Plan Note (Signed)
 Rapid flu and COVUD are negative.  RSV pending.  Azithromycin and prednisone prescribed empirically ,  will add staph aureus coverage if cxr shows an infiltrate .  Xopenex for wheezing .tussionex refilled

## 2023-04-26 NOTE — Patient Instructions (Signed)
 Prednisone  and azithromycin   prescribed   will add 2nd abx if chest x ray suggests community acquired pneumonia  Tussionex  and fluconazole  refilled  Xopenex  is  albuterol-like   but less active on heart mus)cle so it should not elevated pulse if used judiciously (1-2 puffs every 8 hours at most

## 2023-04-27 LAB — POC COVID19 BINAXNOW: SARS Coronavirus 2 Ag: NEGATIVE

## 2023-04-27 LAB — POCT INFLUENZA A/B
Influenza A, POC: NEGATIVE
Influenza B, POC: NEGATIVE

## 2023-04-27 NOTE — Assessment & Plan Note (Addendum)
 Lung exam is notable for ronchi without egophony  Prednisone taper,  empiric antibiotics, XOPENEX INHALER .  Tussionex and benzonotate for cough control.   [name] COVID AND FLU RAPID TESTS  NEGATIVE  RSV negative,  parainfluenza Virus 3 positive

## 2023-04-28 LAB — RESPIRATORY VIRUS PANEL
Adenovirus B: NOT DETECTED
HUMAN PARAINFLU VIRUS 1: NOT DETECTED
HUMAN PARAINFLU VIRUS 2: NOT DETECTED
HUMAN PARAINFLU VIRUS 3: DETECTED — AB
INFLUENZA A SUBTYPE H1: NOT DETECTED
INFLUENZA A SUBTYPE H3: NOT DETECTED
Influenza A: NOT DETECTED
Influenza B: NOT DETECTED
Metapneumovirus: NOT DETECTED
Respiratory Syncytial Virus A: NOT DETECTED
Respiratory Syncytial Virus B: NOT DETECTED
Rhinovirus: NOT DETECTED

## 2023-04-29 DIAGNOSIS — B348 Other viral infections of unspecified site: Secondary | ICD-10-CM | POA: Insufficient documentation

## 2023-04-29 HISTORY — DX: Other viral infections of unspecified site: B34.8

## 2023-05-01 ENCOUNTER — Encounter: Payer: Self-pay | Admitting: Internal Medicine

## 2023-05-02 ENCOUNTER — Other Ambulatory Visit: Payer: Self-pay

## 2023-05-02 ENCOUNTER — Other Ambulatory Visit: Payer: Self-pay | Admitting: Internal Medicine

## 2023-05-02 MED ORDER — ROSUVASTATIN CALCIUM 10 MG PO TABS
10.0000 mg | ORAL_TABLET | Freq: Every day | ORAL | 1 refills | Status: DC
  Filled 2023-05-02: qty 90, 90d supply, fill #0
  Filled 2023-08-04: qty 90, 90d supply, fill #1

## 2023-05-02 MED ORDER — OZEMPIC (0.25 OR 0.5 MG/DOSE) 2 MG/3ML ~~LOC~~ SOPN
0.5000 mg | PEN_INJECTOR | SUBCUTANEOUS | 0 refills | Status: DC
  Filled 2023-05-02: qty 3, 28d supply, fill #0
  Filled 2023-05-28: qty 3, 28d supply, fill #1
  Filled 2023-07-01: qty 3, 28d supply, fill #2

## 2023-05-02 MED ORDER — METOPROLOL SUCCINATE ER 25 MG PO TB24
25.0000 mg | ORAL_TABLET | Freq: Every day | ORAL | 11 refills | Status: DC
  Filled 2023-05-02: qty 30, 30d supply, fill #0
  Filled 2023-05-28: qty 30, 30d supply, fill #1

## 2023-05-02 MED ORDER — PREDNISONE 10 MG PO TABS: ORAL_TABLET | ORAL | 0 refills | Status: DC

## 2023-05-02 MED ORDER — HYDROCOD POLI-CHLORPHE POLI ER 10-8 MG/5ML PO SUER: 5.0000 mL | Freq: Two times a day (BID) | ORAL | 0 refills | Status: DC | PRN

## 2023-05-24 ENCOUNTER — Other Ambulatory Visit: Payer: Self-pay

## 2023-05-24 MED ORDER — DILTIAZEM HCL ER COATED BEADS 240 MG PO CP24
240.0000 mg | ORAL_CAPSULE | Freq: Every day | ORAL | 3 refills | Status: DC
  Filled 2023-05-24: qty 90, 90d supply, fill #0
  Filled 2023-08-04: qty 90, 90d supply, fill #1
  Filled 2023-11-04: qty 90, 90d supply, fill #2

## 2023-05-24 MED FILL — Fluconazole Tab 150 MG: ORAL | 2 days supply | Qty: 2 | Fill #2 | Status: AC

## 2023-05-30 ENCOUNTER — Other Ambulatory Visit: Payer: Self-pay

## 2023-06-14 ENCOUNTER — Other Ambulatory Visit: Payer: Self-pay | Admitting: Internal Medicine

## 2023-06-14 MED ORDER — CIPROFLOXACIN HCL 250 MG PO TABS: 250.0000 mg | ORAL_TABLET | Freq: Two times a day (BID) | ORAL | 0 refills | Status: AC

## 2023-06-14 MED ORDER — ALPRAZOLAM 0.25 MG PO TABS
0.2500 mg | ORAL_TABLET | Freq: Two times a day (BID) | ORAL | 0 refills | Status: AC | PRN
Start: 2023-06-14 — End: ?

## 2023-06-16 ENCOUNTER — Other Ambulatory Visit
Admission: RE | Admit: 2023-06-16 | Discharge: 2023-06-16 | Disposition: A | Discharge status: Home / Self Care | Payer: Medicare Other | Source: Ambulatory Visit | Attending: Internal Medicine | Admitting: Internal Medicine

## 2023-06-16 ENCOUNTER — Other Ambulatory Visit: Payer: Self-pay | Admitting: Internal Medicine

## 2023-06-16 ENCOUNTER — Encounter: Payer: Self-pay | Admitting: Internal Medicine

## 2023-06-16 DIAGNOSIS — I471 Supraventricular tachycardia, unspecified: Secondary | ICD-10-CM | POA: Insufficient documentation

## 2023-06-16 LAB — CBC WITH DIFFERENTIAL/PLATELET
Abs Immature Granulocytes: 0.05 10*3/uL (ref 0.00–0.07)
Basophils Absolute: 0.1 10*3/uL (ref 0.0–0.1)
Basophils Relative: 1 %
Eosinophils Absolute: 0.1 10*3/uL (ref 0.0–0.5)
Eosinophils Relative: 1 %
HCT: 44.2 % (ref 36.0–46.0)
Hemoglobin: 14.8 g/dL (ref 12.0–15.0)
Immature Granulocytes: 1 %
Lymphocytes Relative: 27 %
Lymphs Abs: 2.9 10*3/uL (ref 0.7–4.0)
MCH: 27.6 pg (ref 26.0–34.0)
MCHC: 33.5 g/dL (ref 30.0–36.0)
MCV: 82.5 fL (ref 80.0–100.0)
Monocytes Absolute: 0.8 10*3/uL (ref 0.1–1.0)
Monocytes Relative: 7 %
Neutro Abs: 6.6 10*3/uL (ref 1.7–7.7)
Neutrophils Relative %: 63 %
Platelets: 269 10*3/uL (ref 150–400)
RBC: 5.36 MIL/uL — ABNORMAL HIGH (ref 3.87–5.11)
RDW: 14.2 % (ref 11.5–15.5)
WBC: 10.5 10*3/uL (ref 4.0–10.5)
nRBC: 0 % (ref 0.0–0.2)

## 2023-06-16 LAB — COMPREHENSIVE METABOLIC PANEL
ALT: 18 U/L (ref 0–44)
AST: 22 U/L (ref 15–41)
Albumin: 3.7 g/dL (ref 3.5–5.0)
Alkaline Phosphatase: 69 U/L (ref 38–126)
Anion gap: 13 (ref 5–15)
BUN: 17 mg/dL (ref 8–23)
CO2: 22 mmol/L (ref 22–32)
Calcium: 8.7 mg/dL — ABNORMAL LOW (ref 8.9–10.3)
Chloride: 103 mmol/L (ref 98–111)
Creatinine, Ser: 0.86 mg/dL (ref 0.44–1.00)
GFR, Estimated: >60 mL/min (ref >60)
Glucose, Bld: 174 mg/dL — ABNORMAL HIGH (ref 70–99)
Potassium: 3.9 mmol/L (ref 3.5–5.1)
Sodium: 138 mmol/L (ref 135–145)
Total Bilirubin: 0.6 mg/dL (ref 0.0–1.2)
Total Protein: 6.6 g/dL (ref 6.5–8.1)

## 2023-06-16 LAB — TSH: TSH: 2.57 u[IU]/mL (ref 0.350–4.500)

## 2023-06-16 LAB — MAGNESIUM: Magnesium: 1.9 mg/dL (ref 1.7–2.4)

## 2023-06-16 MED ORDER — DILTIAZEM HCL 30 MG PO TABS: 30.0000 mg | ORAL_TABLET | Freq: Every day | ORAL | 3 refills | Status: DC | PRN

## 2023-06-26 ENCOUNTER — Ambulatory Visit: Payer: Medicare Other | Admitting: Internal Medicine

## 2023-07-03 ENCOUNTER — Other Ambulatory Visit: Payer: Self-pay

## 2023-07-03 MED ORDER — METOPROLOL SUCCINATE ER 25 MG PO TB24
25.0000 mg | ORAL_TABLET | Freq: Every day | ORAL | 11 refills | Status: DC
  Filled 2023-07-03: qty 30, 30d supply, fill #0

## 2023-07-04 ENCOUNTER — Encounter: Payer: Self-pay | Admitting: Internal Medicine

## 2023-07-10 ENCOUNTER — Ambulatory Visit: Payer: Medicare Other | Admitting: Internal Medicine

## 2023-07-10 ENCOUNTER — Ambulatory Visit: Attending: Internal Medicine

## 2023-07-10 ENCOUNTER — Encounter: Payer: Self-pay | Admitting: Internal Medicine

## 2023-07-10 VITALS — BP 102/78 | HR 100 | Ht 68.0 in | Wt 281.8 lb

## 2023-07-10 DIAGNOSIS — Z1231 Encounter for screening mammogram for malignant neoplasm of breast: Secondary | ICD-10-CM

## 2023-07-10 DIAGNOSIS — Z7985 Long-term (current) use of injectable non-insulin antidiabetic drugs: Secondary | ICD-10-CM | POA: Diagnosis not present

## 2023-07-10 DIAGNOSIS — I152 Hypertension secondary to endocrine disorders: Secondary | ICD-10-CM

## 2023-07-10 DIAGNOSIS — E1169 Type 2 diabetes mellitus with other specified complication: Secondary | ICD-10-CM

## 2023-07-10 DIAGNOSIS — E785 Hyperlipidemia, unspecified: Secondary | ICD-10-CM | POA: Diagnosis not present

## 2023-07-10 DIAGNOSIS — I480 Paroxysmal atrial fibrillation: Secondary | ICD-10-CM

## 2023-07-10 DIAGNOSIS — E669 Obesity, unspecified: Secondary | ICD-10-CM

## 2023-07-10 DIAGNOSIS — E039 Hypothyroidism, unspecified: Secondary | ICD-10-CM

## 2023-07-10 DIAGNOSIS — I471 Supraventricular tachycardia, unspecified: Secondary | ICD-10-CM

## 2023-07-10 DIAGNOSIS — E1159 Type 2 diabetes mellitus with other circulatory complications: Secondary | ICD-10-CM

## 2023-07-10 MED ORDER — APIXABAN 5 MG PO TABS: 5.0000 mg | ORAL_TABLET | Freq: Two times a day (BID) | ORAL | 2 refills | Status: DC

## 2023-07-10 MED ORDER — OZEMPIC (0.25 OR 0.5 MG/DOSE) 2 MG/3ML ~~LOC~~ SOPN: 0.5000 mg | PEN_INJECTOR | SUBCUTANEOUS | 0 refills | Status: DC

## 2023-07-10 NOTE — Patient Instructions (Signed)
 ZIO MONITOR ORDERED  (NOT TO BE DELIVERED PRIOR TO MARCH 310   START ELIQUIS 5 MG TWICE DAILY  TO REDUCE YOUR RISK OF EMBOLIC STROKE   REFERRAL TO GREG TAYLOR IN PROCESS

## 2023-07-10 NOTE — Progress Notes (Unsigned)
 Subjective:  Patient ID: Tiffany Wells, female    DOB: Dec 29, 1955  Age: 68 y.o. MRN: [id number]  CC: The primary encounter diagnosis was Encounter for screening mammogram for malignant neoplasm of breast. Diagnoses of Obesity, diabetes, and hypertension syndrome (HCC), Hyperlipidemia, unspecified hyperlipidemia type, Paroxysmal atrial fibrillation (HCC), SVT (supraventricular tachycardia) (HCC), Long-term current use of injectable noninsulin antidiabetic medication, and Acquired hypothyroidism were also pertinent to this visit.   HPI Tiffany Wells presents for  Chief Complaint  Patient presents with   Medical Management of Chronic Issues    [name] is a 68 yr old female with type 2 DM,  untreated OSA, h/o postsurgical DVT/PE,   morbid obesity,  and History of SVT,  who prevents with increased frequency of tachyrarrhythmia.  SVT has historically been  controlled with metoprolol low dose and diltiazem 240 mg , with episodes managed with additional short acting diltiazem,; however she  notes that episodes have   been occurring more frequently for the past 6 weeks and now requiring multiple doses of diltiazem. Before resolving.  The episodes have been occurring daily now,  regardless of activity,  and lasting longer ,  an hour or more.  Not  waking her up from sleep.  She was advised by her former cardiologist, Mariel Kansky, to wear an [employer]  watch , which she has been using.  And about 15% of her episodes have been described by the watch as atrial fibrillation. She denies chest pain, nausea and jaw tightness,  but notes fatigue during the episodes   The untreated OSA was apparently diagnosed several years ago by  [facility] sleep lab,  ordered by her former provider and CPAP was ordered but never obtained   Type 2 DM:  She is  not  exercising regularly but physically active and recently started a walking program after returning from a trip to [facility] which included extensive walking daily.  . Checking   blood sugars less than once daily at variable times, usually only if she feels she may be having a hypoglycemic event. .  BS have been under 130 fasting and < 150 post prandially.  Denies any recent hypoglyemic events.  Using Ozempic . Following a carbohydrate modified diet 6 days per week. Denies numbness, burning and tingling of extremities. Appetite is diminished .     Outpatient Medications Prior to Visit  Medication Sig Dispense Refill   ALPRAZolam (XANAX) 0.25 MG tablet Take 1 tablet (0.25 mg total) by mouth 2 (two) times daily as needed for anxiety. 20 tablet 0   clindamycin (CLEOCIN T) 1 % external solution Apply to affected sites on scalp twice a day until clear, then as needed 30 mL 2   cyclobenzaprine (FLEXERIL) 10 MG tablet Take 1 tablet (10 mg total) by mouth at bedtime. 90 tablet 1   diltiazem (CARDIZEM CD) 240 MG 24 hr capsule Take 1 capsule (240 mg total) by mouth daily. 90 capsule 3   diltiazem (CARDIZEM) 30 MG tablet Take 1 tablet (30 mg total) by mouth daily as needed (SVT). 30 tablet 3   diphenhydrAMINE (BENADRYL) 25 MG tablet As needed for allergies.     diphenoxylate-atropine (LOMOTIL) 2.5-0.025 MG tablet TAKE ONE TABLET BY MOUTH AS NEEDED 30 tablet 2   estradiol (ESTRACE) 0.1 MG/GM vaginal cream PLACE 1/4 APPLICATORFUL VAGINALLY DAILY. 42.5 g 9   fluconazole (DIFLUCAN) 150 MG tablet Take 1 tablet (150 mg total) by mouth daily for 2 days. 2 tablet 2   hyoscyamine (  LEVSIN SL) 0.125 MG SL tablet PLACE 1 TABLET UNDER THE TONGUE EVERY 4 HOURS AS NEEDED FOR CRAMPING. 60 tablet 5   levothyroxine (SYNTHROID) 50 MCG tablet TAKE 1 TABLET BY MOUTH DAILY BEFORE BREAKFAST 90 tablet 3   metoprolol succinate (TOPROL-XL) 25 MG 24 hr tablet Take 1 tablet (25 mg total) by mouth daily. 30 tablet 11   metoprolol succinate (TOPROL-XL) 25 MG 24 hr tablet Take 1 tablet (25 mg total) by mouth once daily 30 tablet 11   nystatin-triamcinolone ointment (MYCOLOG) Apply 1 Application topically 2 (two)  times daily. 30 g 2   omeprazole (PRILOSEC OTC) 20 MG tablet Take 20 mg by mouth daily.     ondansetron (ZOFRAN-ODT) 4 MG disintegrating tablet Take 1 tablet (4 mg total) by mouth every 8 (eight) hours as needed for nausea or vomiting. 20 tablet 0   promethazine (PHENERGAN) 12.5 MG tablet Take 1 tablet (12.5 mg total) by mouth every 8 (eight) hours as needed for nausea or vomiting. 20 tablet 0   rosuvastatin (CRESTOR) 10 MG tablet Take 1 tablet (10 mg total) by mouth daily. 90 tablet 1   fluconazole (DIFLUCAN) 150 MG tablet Take 1 tablet (150 mg total) by mouth daily. 2 tablet 0   Semaglutide,0.25 or 0.5MG /DOS, (OZEMPIC, 0.25 OR 0.5 MG/DOSE,) 2 MG/3ML SOPN Inject 0.5 mg into the skin once a week. 9 mL 0   chlorpheniramine-HYDROcodone (TUSSIONEX) 10-8 MG/5ML Take 5 mLs by mouth every 12 (twelve) hours as needed for cough. 140 mL 0   levalbuterol (XOPENEX HFA) 45 MCG/ACT inhaler Inhale 1-2 puffs into the lungs every 8 (eight) hours as needed for wheezing. 1 each 12   metoprolol succinate (TOPROL-XL) 25 MG 24 hr tablet TAKE 1 TABLET (25 MG TOTAL) BY MOUTH DAILY. 90 tablet 1   polyethylene glycol (GOLYTELY) 236 g solution Drink 8 ounces every  30 minutes until constipation has resolved. 4000 mL 0   predniSONE (DELTASONE) 10 MG tablet 6 tablets on Day 1 , then reduce by 1 tablet daily until gone 21 tablet 0   No facility-administered medications prior to visit.    Review of Systems;  Patient denies headache, fevers, malaise, unintentional weight loss, skin rash, eye pain, sinus congestion and sinus pain, sore throat, dysphagia,  hemoptysis , cough, dyspnea, wheezing, chest pain, palpitations, orthopnea, edema, abdominal pain, nausea, melena, diarrhea, constipation, flank pain, dysuria, hematuria, urinary  Frequency, nocturia, numbness, tingling, seizures,  Focal weakness, Loss of consciousness,  Tremor, insomnia, depression, anxiety, and suicidal ideation.      Objective:  BP 102/78   Pulse 100    Ht 5\' 8"  (1.727 m)   Wt 281 lb 12.8 oz (127.8 kg)   SpO2 98%   BMI 42.85 kg/m   BP Readings from Last 3 Encounters:  07/10/23 102/78  04/26/23 118/72  04/03/23 130/78    Wt Readings from Last 3 Encounters:  07/10/23 281 lb 12.8 oz (127.8 kg)  04/26/23 279 lb (126.6 kg)  04/03/23 282 lb 6.4 oz (128.1 kg)    Physical Exam Vitals reviewed.  Constitutional:      General: She is not in acute distress.    Appearance: Normal appearance. She is normal weight. She is not ill-appearing, toxic-appearing or diaphoretic.  HENT:     Head: Normocephalic.  Eyes:     General: No scleral icterus.       Right eye: No discharge.        Left eye: No discharge.     Conjunctiva/sclera: Conjunctivae  normal.  Cardiovascular:     Rate and Rhythm: Normal rate and regular rhythm.     Heart sounds: Normal heart sounds.  Pulmonary:     Effort: Pulmonary effort is normal. No respiratory distress.     Breath sounds: Normal breath sounds.  Musculoskeletal:        General: Normal range of motion.  Skin:    General: Skin is warm and dry.  Neurological:     General: No focal deficit present.     Mental Status: She is alert and oriented to person, place, and time. Mental status is at baseline.  Psychiatric:        Mood and Affect: Mood normal.        Behavior: Behavior normal.        Thought Content: Thought content normal.        Judgment: Judgment normal.    Lab Results  Component Value Date   HGBA1C 6.6 (A) 07/11/2023   HGBA1C 6.2 01/06/2023   HGBA1C 6.1 10/04/2022    Lab Results  Component Value Date   CREATININE 0.86 06/16/2023   CREATININE 0.96 04/03/2023   CREATININE 0.87 01/06/2023    Lab Results  Component Value Date   WBC 10.5 06/16/2023   HGB 14.8 06/16/2023   HCT 44.2 06/16/2023   PLT 269 06/16/2023   GLUCOSE 174 (H) 06/16/2023   CHOL 147 01/06/2023   TRIG 171 (H) 01/06/2023   HDL 45 (L) 01/06/2023   LDLDIRECT 100.0 10/04/2022   LDLCALC 75 01/06/2023   ALT 18  06/16/2023   AST 22 06/16/2023   NA 138 06/16/2023   K 3.9 06/16/2023   CL 103 06/16/2023   CREATININE 0.86 06/16/2023   BUN 17 06/16/2023   CO2 22 06/16/2023   TSH 2.570 06/16/2023   INR 1.1 02/13/2014   HGBA1C 6.6 (A) 07/11/2023   MICROALBUR 0.7 07/11/2022    No results found.  Assessment & Plan:  .Encounter for screening mammogram for malignant neoplasm of breast -     3D Screening Mammogram, Left and Right; Future  Obesity, diabetes, and hypertension syndrome (HCC) Assessment & Plan: Well controlled BY today's A1c.  Continue ozempic at lowest dose.  She has documented intolerance to metformin.  Given her frequent UTI's,  SGLT 2 inhibitor would likely not be tolerated    Lab Results  Component Value Date   HGBA1C 6.6 (A) 07/11/2023     Orders: -     Ozempic (0.25 or 0.5 MG/DOSE); Inject 0.5 mg into the skin once a week.  Dispense: 9 mL; Refill: 0 -     POCT glycosylated hemoglobin (Hb A1C) -     Microalbumin / creatinine urine ratio; Future  Hyperlipidemia, unspecified hyperlipidemia type  Paroxysmal atrial fibrillation (HCC) -     EKG 12-Lead -     LONG TERM MONITOR (3-14 DAYS); Future  SVT (supraventricular tachycardia) (HCC) Assessment & Plan: I have ordered and reviewed a 12 lead EKG and find that there are no acute changes and patient is in sinus rhythm.   However review of tracings from her  APPLE WATCH note several episodes  of atrial fibrillation . Last ECHO no valvular stenosis 2022,  LVEF > 55%.  Given her history of type 2 DM and post surgical DVT/PE will start Eliquis for stroke  risk mitigation , order ZIO monitor and refer back to Sharrell Ku   Orders: -     Ambulatory referral to Cardiology  Long-term current use of injectable noninsulin  antidiabetic medication Assessment & Plan: Ozempic has been approved through May 2025.  She has lost 30 lbs and currently is taking 0.25 mg dose    Acquired hypothyroidism Assessment & Plan: Sheis tolerating   the smallest dose of levothyroxine   Most recent TSH was normal in [facility] n 2 weeks ago  No changes to therapy   Lab Results  Component Value Date   TSH 2.570 06/16/2023       Other orders -     Apixaban; Take 1 tablet (5 mg total) by mouth 2 (two) times daily.  Dispense: 60 tablet; Refill: 2     In addition to time spend revieweing today's EKG, I  spent 34 minutes on the day of this face to face encounter reviewing patient's  most recent visit with cardiology,  n prior relevant surgical and non surgical procedures, recent  labs and imaging studies, counseling on weight management,  reviewing the assessment and plan with patient, and post visit ordering and reviewing of  diagnostics and therapeutics with patient  .   Follow-up: No follow-ups on file.   Sherlene Shams, MD

## 2023-07-10 NOTE — Assessment & Plan Note (Signed)
 WITH APPLE WATCH noting several episodes  of atrial fibrillation . Last ECHO no valvular stenosis 2022,  LVEF > 55%.  Given her history of type 2 DM and post surgical DVT/PE will start Eliquis for stroke  risk mitigation , order ZIO monitor and refer back to Sharrell Ku

## 2023-07-11 LAB — POCT GLYCOSYLATED HEMOGLOBIN (HGB A1C): Hemoglobin A1C: 6.6 % — AB (ref 4.0–5.6)

## 2023-07-11 NOTE — Assessment & Plan Note (Signed)
 Sheis tolerating  the smallest dose of levothyroxine   Most recent TSH was normal in [facility] n 2 weeks ago  No changes to therapy   Lab Results  Component Value Date   TSH 2.570 06/16/2023

## 2023-07-11 NOTE — Assessment & Plan Note (Signed)
Ozempic has been approved through May 2025.  She has lost 30 lbs and currently is taking 0.25 mg dose

## 2023-07-11 NOTE — Assessment & Plan Note (Signed)
 Well controlled BY today's A1c.  Continue ozempic at lowest dose.  She has documented intolerance to metformin.  Given her frequent UTI's,  SGLT 2 inhibitor would likely not be tolerated    Lab Results  Component Value Date   HGBA1C 6.6 (A) 07/11/2023

## 2023-07-18 ENCOUNTER — Other Ambulatory Visit: Payer: Self-pay | Admitting: Internal Medicine

## 2023-07-18 MED ORDER — METOPROLOL SUCCINATE ER 50 MG PO TB24: 50.0000 mg | ORAL_TABLET | Freq: Every day | ORAL | 1 refills | Status: DC

## 2023-07-26 ENCOUNTER — Other Ambulatory Visit: Payer: Self-pay | Admitting: Internal Medicine

## 2023-07-26 ENCOUNTER — Other Ambulatory Visit (INDEPENDENT_AMBULATORY_CARE_PROVIDER_SITE_OTHER)

## 2023-07-26 DIAGNOSIS — E1159 Type 2 diabetes mellitus with other circulatory complications: Secondary | ICD-10-CM

## 2023-07-26 DIAGNOSIS — R300 Dysuria: Secondary | ICD-10-CM | POA: Diagnosis not present

## 2023-07-26 DIAGNOSIS — E669 Obesity, unspecified: Secondary | ICD-10-CM | POA: Diagnosis not present

## 2023-07-26 DIAGNOSIS — E1169 Type 2 diabetes mellitus with other specified complication: Secondary | ICD-10-CM

## 2023-07-26 DIAGNOSIS — I152 Hypertension secondary to endocrine disorders: Secondary | ICD-10-CM | POA: Diagnosis not present

## 2023-07-26 LAB — URINALYSIS, ROUTINE W REFLEX MICROSCOPIC: RBC / HPF: NONE SEEN (ref 0–?)

## 2023-07-26 LAB — MICROALBUMIN / CREATININE URINE RATIO
Creatinine,U: 106.1 mg/dL
Microalb Creat Ratio: 7.5 mg/g (ref 0.0–30.0)
Microalb, Ur: 0.8 mg/dL (ref 0.0–1.9)

## 2023-07-26 MED ORDER — CIPROFLOXACIN HCL 250 MG PO TABS: 250.0000 mg | ORAL_TABLET | Freq: Two times a day (BID) | ORAL | 0 refills | Status: AC

## 2023-07-28 ENCOUNTER — Encounter: Payer: Self-pay | Admitting: Internal Medicine

## 2023-07-28 DIAGNOSIS — R300 Dysuria: Secondary | ICD-10-CM

## 2023-07-28 LAB — URINE CULTURE
MICRO NUMBER:: 16308708
Result:: NO GROWTH
SPECIMEN QUALITY:: ADEQUATE

## 2023-08-04 ENCOUNTER — Other Ambulatory Visit: Payer: Self-pay

## 2023-08-04 ENCOUNTER — Other Ambulatory Visit: Payer: Self-pay | Admitting: Internal Medicine

## 2023-08-06 ENCOUNTER — Other Ambulatory Visit: Payer: Self-pay

## 2023-08-06 ENCOUNTER — Other Ambulatory Visit: Payer: Self-pay | Admitting: Internal Medicine

## 2023-08-07 ENCOUNTER — Other Ambulatory Visit: Payer: Self-pay

## 2023-08-07 MED FILL — Ondansetron Orally Disintegrating Tab 4 MG: ORAL | 7 days supply | Qty: 20 | Fill #0 | Status: AC

## 2023-08-07 MED FILL — Cyclobenzaprine HCl Tab 10 MG: ORAL | 90 days supply | Qty: 90 | Fill #0 | Status: AC

## 2023-08-07 MED FILL — Estradiol Vaginal Cream 0.01%: VAGINAL | 90 days supply | Qty: 42.5 | Fill #0 | Status: AC

## 2023-08-09 ENCOUNTER — Telehealth: Payer: Self-pay | Admitting: Internal Medicine

## 2023-08-09 DIAGNOSIS — I480 Paroxysmal atrial fibrillation: Secondary | ICD-10-CM | POA: Diagnosis not present

## 2023-08-09 NOTE — Telephone Encounter (Signed)
 Per Dr. Marven Slimmer patient needs a cardiology appointment due to symptomatic sustained SVT per monitor ordered by PCP. Left voicemail to have patient call back. Patient would like to see Dr. Carolynne Citron, as she has seen him before. Referral was placed 3/24

## 2023-08-10 ENCOUNTER — Encounter: Payer: Self-pay | Admitting: Internal Medicine

## 2023-08-14 ENCOUNTER — Telehealth: Payer: Self-pay

## 2023-08-14 NOTE — Telephone Encounter (Signed)
 Pharmacy Patient Advocate Encounter   Received notification from CoverMyMeds that prior authorization for Ozempic  (0.25 or 0.5 MG/DOSE) 2MG /3ML pen-injectors  is required/requested.   Insurance verification completed.   The patient is insured through [facility] .   Per test claim: PA required; PA submitted to above mentioned insurance via CoverMyMeds Key/confirmation #/EOC [id number] Status is pending

## 2023-08-16 ENCOUNTER — Encounter: Payer: Self-pay | Admitting: Internal Medicine

## 2023-08-16 ENCOUNTER — Other Ambulatory Visit (HOSPITAL_COMMUNITY): Payer: Self-pay

## 2023-08-16 ENCOUNTER — Ambulatory Visit: Attending: Internal Medicine | Admitting: Internal Medicine

## 2023-08-16 VITALS — BP 106/62 | HR 103 | Ht 68.0 in | Wt 277.0 lb

## 2023-08-16 DIAGNOSIS — I471 Supraventricular tachycardia, unspecified: Secondary | ICD-10-CM | POA: Insufficient documentation

## 2023-08-16 NOTE — Patient Instructions (Addendum)
 Medication Instructions:  Your physician recommends that you continue on your current medications as directed. Please refer to the Current Medication list given to you today.  *If you need a refill on your cardiac medications before your next appointment, please call your pharmacy*  Lab Work: None ordered.  You may go to any [facility] Location for your lab work:  [facility] - [address] ([facility]) - [address] [phone] [address]  [place] - [address]   [place]  - [address]   [place] - [address] - [address] [employer]  - [address] - [address] ([facility]   If you have labs (blood work) drawn today and your tests are completely normal, you will receive your results only by: [employer] (if you have MyChart)  If you have any lab test that is abnormal or we need to change your treatment, we will call you or send a MyChart message to review the results.  Testing/Procedures: Dates for procedure: July 2, 16, 22, 30  Follow-Up: At [facility], you and your health needs are our priority.  As part of our continuing mission to provide you with exceptional heart care, we have created designated Provider Care Teams.  These Care Teams include your primary Cardiologist (physician) and Advanced Practice Providers (APPs -  Physician Assistants and Nurse Practitioners) who all work together to provide you with the care you need, when you need it.  Your next appointment:   To be scheduled  The format for your next appointment:   In Person  Provider:   Manya Sells, MD{or one of the following Advanced Practice Providers on your designated Care Team:   Mertha Abrahams, [place] Bambi Lever "Jonelle Neri" [place], [place] Neda Balk, NP  Note: Remote monitoring is used to monitor your Pacemaker/ ICD from home. This monitoring reduces the number of  office visits required to check your device to one time per year. It allows us  to keep an eye on the functioning of your device to ensure it is working properly.

## 2023-08-16 NOTE — Telephone Encounter (Signed)
 Pharmacy Patient Advocate Encounter  Received notification from [facility] that Prior Authorization for Ozempic  (0.25 or 0.5 MG/DOSE) 2MG /3ML pen-injectors has been APPROVED Unable to obtain price due to refill too soon rejection, next available fill date 09/30/2023

## 2023-08-16 NOTE — Progress Notes (Signed)
 HPI Tiffany Wells returns today after an over 6 year absence from our EP clinic. She is a very pleasant 68 yo woman with a long h/o palpitations and SVT. She recently wore a cardiac monitor which showed runs of SVT up to 180/min. They stop and start. Unclear if due to a re-entrant mechanism or automatic. I saw her with the same problem 6 years ago. She had been fairly well controlled but has had worsening symptoms over the past several months, associated with COVID. She also has a h/o sleep apnea but has not been on CPAP. She is working on this. She has not had syncope. She has an Apple watch which reports afib though I suspect not. She was started on Eliquis  because of the Apple watch findings. Her Zio did not show afib.  Allergies  Allergen Reactions   Codeine  Itching    REACTION: Itching   Codeine  Sulfate     REACTION: Itching   Glucophage  Xr [Metformin ]     Uncontrolled diarrhea   Sulfa  Antibiotics     Facial burning   Tape Rash    Adhesive tape, silicones. Tears skin.     Current Outpatient Medications  Medication Sig Dispense Refill   ALPRAZolam  (XANAX ) 0.25 MG tablet Take 1 tablet (0.25 mg total) by mouth 2 (two) times daily as needed for anxiety. 20 tablet 0   clindamycin  (CLEOCIN  T) 1 % external solution Apply to affected sites on scalp twice a day until clear, then as needed 30 mL 2   cyclobenzaprine  (FLEXERIL ) 10 MG tablet Take 1 tablet (10 mg total) by mouth at bedtime. 90 tablet 1   diltiazem  (CARDIZEM  CD) 240 MG 24 hr capsule Take 1 capsule (240 mg total) by mouth daily. 90 capsule 3   diltiazem  (CARDIZEM ) 30 MG tablet Take 1 tablet (30 mg total) by mouth daily as needed (SVT). 30 tablet 3   diphenhydrAMINE (BENADRYL) 25 MG tablet As needed for allergies.     diphenoxylate -atropine  (LOMOTIL ) 2.5-0.025 MG tablet TAKE ONE TABLET BY MOUTH AS NEEDED 30 tablet 2   estradiol  (ESTRACE ) 0.1 MG/GM vaginal cream PLACE 1/4 APPLICATORFUL VAGINALLY DAILY. 42.5 g 9   fluconazole   (DIFLUCAN ) 150 MG tablet Take 1 tablet (150 mg total) by mouth daily for 2 days. 2 tablet 2   hyoscyamine  (LEVSIN SL) 0.125 MG SL tablet PLACE 1 TABLET UNDER THE TONGUE EVERY 4 HOURS AS NEEDED FOR CRAMPING. 60 tablet 5   levothyroxine  (SYNTHROID ) 50 MCG tablet TAKE 1 TABLET BY MOUTH DAILY BEFORE BREAKFAST 90 tablet 3   loratadine (CLARITIN) 10 MG tablet Take 10 mg by mouth daily.     metoprolol  succinate (TOPROL -XL) 25 MG 24 hr tablet Take 25 mg by mouth in the morning and at bedtime.     nystatin -triamcinolone  ointment (MYCOLOG) Apply 1 Application topically 2 (two) times daily. 30 g 2   omeprazole (PRILOSEC OTC) 20 MG tablet Take 20 mg by mouth daily.     ondansetron  (ZOFRAN -ODT) 4 MG disintegrating tablet Take 1 tablet (4 mg total) by mouth every 8 (eight) hours as needed for nausea or vomiting. 20 tablet 0   promethazine  (PHENERGAN ) 12.5 MG tablet Take 1 tablet (12.5 mg total) by mouth every 8 (eight) hours as needed for nausea or vomiting. 20 tablet 0   rosuvastatin  (CRESTOR ) 10 MG tablet Take 1 tablet (10 mg total) by mouth daily. 90 tablet 1   Semaglutide ,0.25 or 0.5MG /DOS, (OZEMPIC , 0.25 OR 0.5 MG/DOSE,) 2 MG/3ML SOPN Inject 0.5  mg into the skin once a week. 9 mL 0   apixaban  (ELIQUIS ) 5 MG TABS tablet Take 1 tablet (5 mg total) by mouth 2 (two) times daily. 60 tablet 2   No current facility-administered medications for this visit.     Past Medical History:  Diagnosis Date   Acute pulmonary embolism (HCC) 08/06/2013   Diagnosed during hospitalization for TKR.  Patient received heparin followed by Xarelto  whic hwill continue for a total of 6 months.   [facility] has recommended IVC placement prior to next TKR and bridging with Lovenox (June 17  [place])     Arthritis    osteoarthritis of knee   Back pain    Diabetes mellitus without complication (HCC)    DVT (deep venous thrombosis) (HCC) 12/28/2016   Dysrhythmia 12/28/2016   atrial fibrillation, SVT   Headache    Migraines   History  of Salmonella gastroenteritis 2012   Hypertension    IBS (irritable bowel syndrome)    Internal hemorrhoids    Ischemic colitis (HCC) 02/21/2014   Normal cardiac stress test    2006, nromal stress cardiolite, EF 65%   Pulmonary embolism (HCC)    post knee surgery   Vertigo    few episodes    ROS:   All systems reviewed and negative except as noted in the HPI.   Past Surgical History:  Procedure Laterality Date   CATARACT EXTRACTION W/PHACO Left 02/14/2023   Procedure: CATARACT EXTRACTION PHACO AND INTRAOCULAR LENS PLACEMENT (IOC) LEFT CLAREON VIVITY TORIC DIABETIC;  Surgeon: Clair Crews, MD;  Location: [facility];  Service: Ophthalmology;  Laterality: Left;  8.26 0:49.3   CATARACT EXTRACTION W/PHACO Right 02/20/2023   Procedure: CATARACT EXTRACTION PHACO AND INTRAOCULAR LENS PLACEMENT (IOC) RIGHT CLAREON VIVITY TORIC  DIABETIC 4.68 00:36.9;  Surgeon: Clair Crews, MD;  Location: [facility];  Service: Ophthalmology;  Laterality: Right;   COLONOSCOPY  03/07/2011   Int Hemms   COLONOSCOPY N/A 08/24/2022   Procedure: COLONOSCOPY;  Surgeon: Toledo, Alphonsus Jeans, MD;  Location: [facility] ENDOSCOPY;  Service: Gastroenterology;  Laterality: N/A;   COLONOSCOPY WITH PROPOFOL  N/A 02/20/2017   Procedure: COLONOSCOPY WITH PROPOFOL ;  Surgeon: Cassie Click, MD;  Location: [facility] ENDOSCOPY;  Service: Endoscopy;  Laterality: N/A;   HYSTEROSCOPY     JOINT REPLACEMENT     Bilateral TKR   ROTATOR CUFF REPAIR     TUBAL LIGATION     UPPER GASTROINTESTINAL ENDOSCOPY  03/07/2011   HH Multip Gastric Polyps/ Dr Felicita Horns     Family History  Problem Relation Age of Onset   Hypertension Mother    Diabetes Mother    Cancer Mother        glioma   Hypertension Father    Heart disease Father 10       CAD with CABG   Cancer Father        multiple myeloma   Arthritis Sister    Hypertension Sister    Cancer Brother        Pre-cancerous lip lesion, testicular CA    Diabetes  Daughter    Cancer Maternal Grandmother        Lung Ca, nonsmoker      Social History   Socioeconomic History   Marital status: Married    Spouse name: Not on file   Number of children: 2   Years of education: Not on file   Highest education level: Bachelor's degree (e.g., BA, AB, BS)  Occupational History   Occupation: Office  Manager, 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   Social Drivers of Corporate investment banker Strain: Low Risk  (04/03/2023)   Overall Financial Resource Strain (CARDIA)    Difficulty of Paying Living Expenses: Not hard at all  Food Insecurity: No Food Insecurity (04/03/2023)   Hunger Vital Sign    Worried About Running Out of Food in the Last Year: Never true    Ran Out of Food in the Last Year: Never true  Transportation Needs: No Transportation Needs (04/03/2023)   PRAPARE - Administrator, Civil Service (Medical): No    Lack of Transportation (Non-Medical): No  Physical Activity: Insufficiently Active (04/03/2023)   Exercise Vital Sign    Days of Exercise per Week: 1 day    Minutes of Exercise per Session: 20 min  Stress: No Stress Concern Present (04/03/2023)   [employer] of Occupational Health - Occupational Stress Questionnaire    Feeling of Stress : Only a little  Social Connections: Socially Integrated (04/03/2023)   Social Connection and Isolation Panel [NHANES]    Frequency of Communication with Friends and Family: Three times a week    Frequency of Social Gatherings with Friends and Family: Once a week    Attends Religious Services:  More than 4 times per year    Active Member of [employer] or Organizations: Yes    Attends Banker Meetings: 1 to 4 times per year    Marital Status: Married  Catering manager Violence: Not At Risk (05/31/2021)   Humiliation, Afraid, Rape, and Kick questionnaire    Fear of Current or Ex-Partner: No    Emotionally Abused: No    Physically Abused: No    Sexually Abused: No     BP 106/62   Pulse (!) 103   Ht 5\' 8"  (1.727 m)   Wt 277 lb (125.6 kg)   SpO2 98%   BMI 42.12 kg/m   Physical Exam:  Well appearing overweight woman, NAD HEENT: Unremarkable Neck:  No JVD, no thyromegally Lymphatics:  No adenopathy Back:  No CVA tenderness Lungs:  Clear with no wheezes HEART:  Regular rate rhythm, no murmurs, no rubs, no clicks Abd:  soft, positive bowel sounds, no organomegally, no rebound, no guarding Ext:  2 plus pulses, no edema, no cyanosis, no clubbing Skin:  No rashes no nodules Neuro:  CN II through XII intact, motor grossly intact  EKG - sinus tachycardia  Assess/Plan: SVT - she has had recurrent symptoms despite medical therapy. I offered her catheter ablation and she will call us  if she wishes to proceed. Obesity -I encouraged continued Ozempic .   Pete Brand Kazmir Oki,MD

## 2023-08-18 ENCOUNTER — Other Ambulatory Visit: Payer: Self-pay | Admitting: Internal Medicine

## 2023-08-18 MED ORDER — FOSFOMYCIN TROMETHAMINE 3 G PO PACK: 3.0000 g | PACK | Freq: Once | ORAL | 0 refills | Status: AC

## 2023-08-18 MED ORDER — PREDNISONE 10 MG PO TABS: ORAL_TABLET | ORAL | 0 refills | Status: DC

## 2023-08-18 MED ORDER — AZITHROMYCIN 500 MG PO TABS: 500.0000 mg | ORAL_TABLET | Freq: Every day | ORAL | 0 refills | Status: DC

## 2023-08-21 ENCOUNTER — Encounter: Payer: Self-pay | Admitting: Internal Medicine

## 2023-08-21 DIAGNOSIS — I471 Supraventricular tachycardia, unspecified: Secondary | ICD-10-CM

## 2023-08-22 NOTE — Telephone Encounter (Signed)
 Spoke with Tiffany Wells and she is scheduled for SVT Ablation with Dr. Carolynne Citron on 7/16 at 8:30 AM.  She will have labs done at [facility] in [place].   Instruction letter will be sent via MyChart.

## 2023-09-14 ENCOUNTER — Other Ambulatory Visit: Payer: Self-pay | Admitting: Internal Medicine

## 2023-09-17 ENCOUNTER — Other Ambulatory Visit: Payer: Self-pay | Admitting: Internal Medicine

## 2023-09-17 MED ORDER — PREDNISONE 10 MG PO TABS: ORAL_TABLET | ORAL | 0 refills | Status: DC

## 2023-09-17 MED ORDER — AMOXICILLIN-POT CLAVULANATE 875-125 MG PO TABS: 1.0000 | ORAL_TABLET | Freq: Two times a day (BID) | ORAL | 0 refills | Status: DC

## 2023-09-19 ENCOUNTER — Encounter: Payer: Self-pay | Admitting: Internal Medicine

## 2023-09-19 LAB — HM MAMMOGRAPHY

## 2023-09-28 ENCOUNTER — Encounter: Payer: Self-pay | Admitting: Internal Medicine

## 2023-10-01 ENCOUNTER — Other Ambulatory Visit: Payer: Self-pay | Admitting: Internal Medicine

## 2023-10-02 ENCOUNTER — Other Ambulatory Visit: Payer: Self-pay | Admitting: Internal Medicine

## 2023-10-02 ENCOUNTER — Other Ambulatory Visit: Payer: Self-pay

## 2023-10-02 NOTE — Telephone Encounter (Signed)
 Refilled: 11/29/2022 Last OV: 07/10/2023 Next OV: not scheduled

## 2023-10-03 ENCOUNTER — Other Ambulatory Visit: Payer: Self-pay

## 2023-10-03 MED FILL — Fluconazole Tab 150 MG: ORAL | 2 days supply | Qty: 2 | Fill #0 | Status: AC

## 2023-10-04 ENCOUNTER — Other Ambulatory Visit: Payer: Self-pay

## 2023-10-05 ENCOUNTER — Other Ambulatory Visit: Payer: Self-pay

## 2023-10-10 ENCOUNTER — Encounter: Payer: Self-pay | Admitting: Internal Medicine

## 2023-10-11 ENCOUNTER — Encounter: Payer: Self-pay | Admitting: Internal Medicine

## 2023-10-11 DIAGNOSIS — R600 Localized edema: Secondary | ICD-10-CM

## 2023-10-12 ENCOUNTER — Other Ambulatory Visit (INDEPENDENT_AMBULATORY_CARE_PROVIDER_SITE_OTHER): Payer: Self-pay | Admitting: Vascular Surgery

## 2023-10-12 ENCOUNTER — Encounter (INDEPENDENT_AMBULATORY_CARE_PROVIDER_SITE_OTHER): Payer: Self-pay | Admitting: Vascular Surgery

## 2023-10-12 ENCOUNTER — Ambulatory Visit (INDEPENDENT_AMBULATORY_CARE_PROVIDER_SITE_OTHER): Payer: Self-pay | Admitting: Vascular Surgery

## 2023-10-12 ENCOUNTER — Ambulatory Visit (INDEPENDENT_AMBULATORY_CARE_PROVIDER_SITE_OTHER)

## 2023-10-12 VITALS — BP 78/53 | HR 134 | Ht 68.0 in | Wt 278.4 lb

## 2023-10-12 DIAGNOSIS — I872 Venous insufficiency (chronic) (peripheral): Secondary | ICD-10-CM | POA: Diagnosis not present

## 2023-10-12 DIAGNOSIS — R600 Localized edema: Secondary | ICD-10-CM

## 2023-10-12 DIAGNOSIS — Z86718 Personal history of other venous thrombosis and embolism: Secondary | ICD-10-CM | POA: Diagnosis not present

## 2023-10-12 DIAGNOSIS — I890 Lymphedema, not elsewhere classified: Secondary | ICD-10-CM | POA: Diagnosis not present

## 2023-10-12 NOTE — Progress Notes (Signed)
 MRN : [id number]  Tiffany Wells is a 68 y.o. ([birth year]) female who presents with chief complaint of legs hurt and swell.  History of Present Illness:   Patient is seen for evaluation of leg swelling. The patient first noticed the swelling remotely but is now concerned because of a  increase in the overall edema particularly of the left lower extremity. The swelling isn't associated with significant pain but more of a discomfort.  There has been an slight increased amount of  discoloration and very subtle skin changes noted by the patient. The patient notes that in the morning the legs are improved but they steadily worsened throughout the course of the day. Elevation seems to make the swelling of the legs better, dependency makes them much worse.   There is no history of ulcerations associated with the swelling.   The patient denies any recent changes in their medications.  The patient has not been wearing graduated compression.  The patient has no had any past angiography, interventions or vascular surgery.  She does have a history of DVT no PE.   There is no history of radiation treatment to the groin or pelvis No history of malignancies. No history of trauma or groin or pelvic surgery. No history of foreign travel or parasitic infections area   Duplex ultrasound of the venous system demonstrates normal deep venous system bilaterally.  There is full compressibility and phasicity.  No evidence of chronic changes no evidence of acute changes.  No outpatient medications have been marked as taking for the 10/12/23 encounter (Appointment) with Jama, Cordella MATSU, MD.    Past Medical History:  Diagnosis Date   Acute pulmonary embolism (HCC) 08/06/2013   Diagnosed during hospitalization for TKR.  Patient received heparin followed by Xarelto  whic hwill continue for a total of 6 months.   [facility] has recommended IVC placement prior to next TKR and bridging with Lovenox (June 17   Jun Rightmyer)     Arthritis    osteoarthritis of knee   Back pain    Diabetes mellitus without complication (HCC)    DVT (deep venous thrombosis) (HCC) 12/28/2016   Dysrhythmia 12/28/2016   atrial fibrillation, SVT   Headache    Migraines   History of Salmonella gastroenteritis 2012   Hypertension    IBS (irritable bowel syndrome)    Internal hemorrhoids    Ischemic colitis (HCC) 02/21/2014   Normal cardiac stress test    2006, nromal stress cardiolite, EF 65%   Pulmonary embolism (HCC)    post knee surgery   Vertigo    few episodes    Past Surgical History:  Procedure Laterality Date   CATARACT EXTRACTION W/PHACO Left 02/14/2023   Procedure: CATARACT EXTRACTION PHACO AND INTRAOCULAR LENS PLACEMENT (IOC) LEFT [facility]ORIC DIABETIC;  Surgeon: Jaye Fallow, MD;  Location: [facility];  Service: Ophthalmology;  Laterality: Left;  8.26 0:49.3   CATARACT EXTRACTION W/PHACO Right 02/20/2023   Procedure: CATARACT EXTRACTION PHACO AND INTRAOCULAR LENS PLACEMENT (IOC) RIGHT [facility] VIVITY TORIC  DIABETIC 4.68 00:36.9;  Surgeon: Jaye Fallow, MD;  Location: [facility];  Service: Ophthalmology;  Laterality: Right;   COLONOSCOPY  03/07/2011   Int Hemms   COLONOSCOPY N/A 08/24/2022   Procedure: COLONOSCOPY;  Surgeon: Toledo, Ladell POUR, MD;  Location: [facility];  Service: Gastroenterology;  Laterality: N/A;   COLONOSCOPY WITH PROPOFOL  N/A 02/20/2017   Procedure: COLONOSCOPY WITH PROPOFOL ;  Surgeon: Viktoria Lamar DASEN, MD;  Location:  [facility];  Service: Endoscopy;  Laterality: N/A;   HYSTEROSCOPY     JOINT REPLACEMENT     Bilateral TKR   ROTATOR CUFF REPAIR     TUBAL LIGATION     UPPER GASTROINTESTINAL ENDOSCOPY  03/07/2011   HH Multip Gastric Polyps/ Dr Viktoria    Social History Social History   Tobacco Use   Smoking status: Never   Smokeless tobacco: Never  Vaping Use   Vaping status: Never Used  Substance Use Topics   Alcohol use: Not  Currently    Alcohol/week: 2.0 standard drinks of alcohol    Types: 2 Glasses of wine per week    Comment: very rare   Drug use: No    Family History Family History  Problem Relation Age of Onset   Hypertension Mother    Diabetes Mother    Cancer Mother        glioma   Hypertension Father    Heart disease Father 4       CAD with CABG   Cancer Father        multiple myeloma   Arthritis Sister    Hypertension Sister    Cancer Brother        Pre-cancerous lip lesion, testicular CA    Diabetes Daughter    Cancer Maternal Grandmother        Lung Ca, nonsmoker     Allergies  Allergen Reactions   Codeine  Itching    REACTION: Itching   Codeine  Sulfate     REACTION: Itching   Glucophage  Xr [Metformin ]     Uncontrolled diarrhea   Sulfa  Antibiotics     Facial burning   Tape Rash    Adhesive tape, silicones. Tears skin.     REVIEW OF SYSTEMS (Negative unless checked)  Constitutional: [] Weight loss  [] Fever  [] Chills Cardiac: [] Chest pain   [] Chest pressure   [] Palpitations   [] Shortness of breath when laying flat   [] Shortness of breath with exertion. Vascular:  [] Pain in legs with walking   [x] Pain in legs at rest  [] History of DVT   [] Phlebitis   [x] Swelling in legs   [] Varicose veins   [] Non-healing ulcers Pulmonary:   [] Uses home oxygen   [] Productive cough   [] Hemoptysis   [] Wheeze  [] COPD   [] Asthma Neurologic:  [] Dizziness   [] Seizures   [] History of stroke   [] History of TIA  [] Aphasia   [] Vissual changes   [] Weakness or numbness in arm   [] Weakness or numbness in leg Musculoskeletal:   [] Joint swelling   [] Joint pain   [] Low back pain Hematologic:  [] Easy bruising  [] Easy bleeding   [] Hypercoagulable state   [] Anemic Gastrointestinal:  [] Diarrhea   [] Vomiting  [] Gastroesophageal reflux/heartburn   [] Difficulty swallowing. Genitourinary:  [] Chronic kidney disease   [] Difficult urination  [] Frequent urination   [] Blood in urine Skin:  [] Rashes   [] Ulcers   Psychological:  [] History of anxiety   []  History of major depression.  Physical Examination  There were no vitals filed for this visit. There is no height or weight on file to calculate BMI. Gen: WD/WN, NAD Head: [place]/AT, No temporalis wasting.  Ear/Nose/Throat: Hearing grossly intact, nares w/o erythema or drainage, pinna without lesions Eyes: PER, EOMI, sclera nonicteric.  Neck: Supple, no gross masses.  No JVD.  Pulmonary:  Good air movement, no audible wheezing, no use of accessory muscles.  Cardiac: RRR, precordium not hyperdynamic. Vascular:  scattered varicosities present bilaterally.  Mild to moderate venous stasis changes  to the legs bilaterally.  2+ soft pitting edema. CEAP [id number]   Vessel Right Left  Radial Palpable Palpable  Gastrointestinal: soft, non-distended. No guarding/no peritoneal signs.  Musculoskeletal: M/S 5/5 throughout.  No deformity.  Neurologic: CN 2-12 intact. Pain and light touch intact in extremities.  Symmetrical.  Speech is fluent. Motor exam as listed above. Psychiatric: Judgment intact, Mood & affect appropriate for pt's clinical situation. Dermatologic: Venous rashes no ulcers noted.  No changes consistent with cellulitis. Lymph : No lichenification or skin changes of chronic lymphedema.  CBC Lab Results  Component Value Date   WBC 10.5 06/16/2023   HGB 14.8 06/16/2023   HCT 44.2 06/16/2023   MCV 82.5 06/16/2023   PLT 269 06/16/2023    BMET    Component Value Date/Time   NA 138 06/16/2023 1454   NA 145 02/13/2014 1737   K 3.9 06/16/2023 1454   K 3.5 02/13/2014 1737   CL 103 06/16/2023 1454   CL 108 (H) 02/13/2014 1737   CO2 22 06/16/2023 1454   CO2 29 02/13/2014 1737   GLUCOSE 174 (H) 06/16/2023 1454   GLUCOSE 102 (H) 02/13/2014 1737   BUN 17 06/16/2023 1454   BUN 14 06/09/2018 0000   BUN 7 02/13/2014 1737   CREATININE 0.86 06/16/2023 1454   CREATININE 0.83 05/24/2019 1543   CALCIUM  8.7 (L) 06/16/2023 1454   CALCIUM  7.7 (L)  02/13/2014 1737   GFRNONAA >60 06/16/2023 1454   GFRNONAA >60 02/15/2014 1153   GFRNONAA >60 12/02/2013 0540   GFRAA >60 08/22/2019 0942   GFRAA >60 02/15/2014 1153   GFRAA >60 12/02/2013 0540   CrCl cannot be calculated (Patient's most recent lab result is older than the maximum 21 days allowed.).  COAG Lab Results  Component Value Date   INR 1.1 02/13/2014   INR 1.3 11/30/2013   INR 1.1 11/14/2013    Radiology No results found.   Assessment/Plan 1. History of DVT (deep vein thrombosis) (Primary) Recommend:  No surgery or intervention at this point in time.    I have reviewed my discussion with the patient regarding venous insufficiency and secondary lymph edema and why it  causes symptoms. I have discussed with the patient the chronic skin changes that accompany these problems and the long term sequela such as ulceration and infection.  Patient will continue wearing graduated compression on a daily basis a prescription, if needed, was given to the patient to keep this updated. The patient will  put the compression on first thing in the morning and removing them in the evening. The patient is instructed specifically not to sleep in the compression.  In addition, behavioral modification including elevation during the day will be continued.  Diet and salt restriction will also be helpful.  Previous duplex ultrasound of the lower extremities shows normal deep venous system, superficial reflux was not present.   Following the review of the ultrasound the patient will follow up PRN to reassess the degree of swelling and the control that graduated compression is offering.   The patient can be assessed for a Lymph Pump at that time.  However, at this time the patient states they are satisfied with the control compression and elevation is yielding.    2. Lymphedema Recommend:  No surgery or intervention at this point in time.    I have reviewed my discussion with the patient  regarding venous insufficiency and secondary lymph edema and why it  causes symptoms. I have discussed with the patient  the chronic skin changes that accompany these problems and the long term sequela such as ulceration and infection.  Patient will continue wearing graduated compression on a daily basis a prescription, if needed, was given to the patient to keep this updated. The patient will  put the compression on first thing in the morning and removing them in the evening. The patient is instructed specifically not to sleep in the compression.  In addition, behavioral modification including elevation during the day will be continued.  Diet and salt restriction will also be helpful.  Previous duplex ultrasound of the lower extremities shows normal deep venous system, superficial reflux was not present.   Following the review of the ultrasound the patient will follow up PRN to reassess the degree of swelling and the control that graduated compression is offering.   The patient can be assessed for a Lymph Pump at that time.  However, at this time the patient states they are satisfied with the control compression and elevation is yielding.    3. Chronic venous insufficiency Recommend:  No surgery or intervention at this point in time.    I have reviewed my discussion with the patient regarding venous insufficiency and secondary lymph edema and why it  causes symptoms. I have discussed with the patient the chronic skin changes that accompany these problems and the long term sequela such as ulceration and infection.  Patient will continue wearing graduated compression on a daily basis a prescription, if needed, was given to the patient to keep this updated. The patient will  put the compression on first thing in the morning and removing them in the evening. The patient is instructed specifically not to sleep in the compression.  In addition, behavioral modification including elevation during the day will be  continued.  Diet and salt restriction will also be helpful.  Previous duplex ultrasound of the lower extremities shows normal deep venous system, superficial reflux was not present.   Following the review of the ultrasound the patient will follow up PRN to reassess the degree of swelling and the control that graduated compression is offering.   The patient can be assessed for a Lymph Pump at that time.  However, at this time the patient states they are satisfied with the control compression and elevation is yielding.      Cordella Shawl, MD  10/12/2023 8:30 AM

## 2023-10-15 ENCOUNTER — Encounter (INDEPENDENT_AMBULATORY_CARE_PROVIDER_SITE_OTHER): Payer: Self-pay | Admitting: Vascular Surgery

## 2023-10-15 ENCOUNTER — Other Ambulatory Visit: Payer: Self-pay | Admitting: Internal Medicine

## 2023-10-15 DIAGNOSIS — I872 Venous insufficiency (chronic) (peripheral): Secondary | ICD-10-CM | POA: Insufficient documentation

## 2023-10-15 DIAGNOSIS — Z86718 Personal history of other venous thrombosis and embolism: Secondary | ICD-10-CM | POA: Insufficient documentation

## 2023-10-15 DIAGNOSIS — I890 Lymphedema, not elsewhere classified: Secondary | ICD-10-CM | POA: Insufficient documentation

## 2023-10-17 LAB — CBC

## 2023-10-18 ENCOUNTER — Other Ambulatory Visit: Payer: Self-pay | Admitting: Internal Medicine

## 2023-10-18 DIAGNOSIS — I152 Hypertension secondary to endocrine disorders: Secondary | ICD-10-CM

## 2023-10-18 LAB — BASIC METABOLIC PANEL WITH GFR
BUN/Creatinine Ratio: 18 (ref 12–28)
BUN: 15 mg/dL (ref 8–27)
CO2: 22 mmol/L (ref 20–29)
Calcium: 9.1 mg/dL (ref 8.7–10.3)
Chloride: 104 mmol/L (ref 96–106)
Creatinine, Ser: 0.85 mg/dL (ref 0.57–1.00)
Glucose: 151 mg/dL — ABNORMAL HIGH (ref 70–99)
Potassium: 4.8 mmol/L (ref 3.5–5.2)
Sodium: 142 mmol/L (ref 134–144)
eGFR: 74 mL/min/{1.73_m2} (ref >59)

## 2023-10-18 LAB — CBC
Hematocrit: 43.3 (ref 34.0–46.6)
Hemoglobin: 13.9 g/dL (ref 11.1–15.9)
MCH: 27.4 pg (ref 26.6–33.0)
MCHC: 32.1 g/dL (ref 31.5–35.7)
MCV: 85 fL (ref 79–97)
Platelets: 219 10*3/uL (ref 150–450)
RBC: 5.07 x10E6/uL (ref 3.77–5.28)
RDW: 13.7 (ref 11.7–15.4)
WBC: 7.8 10*3/uL (ref 3.4–10.8)

## 2023-10-25 ENCOUNTER — Encounter: Payer: Self-pay | Admitting: Internal Medicine

## 2023-10-25 ENCOUNTER — Telehealth (HOSPITAL_COMMUNITY): Payer: Self-pay

## 2023-10-25 ENCOUNTER — Other Ambulatory Visit: Payer: Self-pay | Admitting: Internal Medicine

## 2023-10-25 ENCOUNTER — Other Ambulatory Visit: Payer: Self-pay

## 2023-10-25 MED FILL — Cyclobenzaprine HCl Tab 10 MG: ORAL | 90 days supply | Qty: 90 | Fill #1 | Status: CN

## 2023-10-25 NOTE — Telephone Encounter (Signed)
 Spoke with patient to discuss upcoming procedure.   Labs: completed.   Any recent signs of acute illness or been started on antibiotics? No Any new medications started? No Any medications to hold? Hold Ozempic  for 7 days prior to procedure. Patient states she took a dose today at 0600. Advised to hold next dose until after procedure. Dr. Waddell made aware and agree with recs. Hold Metoprolol  and Cardizem  for 3 days prior to procedure- last dose on July 12.  Any missed doses of blood thinner? No Advised patient to continue taking ANTICOAGULANT: Eliquis  (Apixaban ) twice daily without missing any doses.  Medication instructions:  On the morning of your procedure DO NOT take any medication., including Eliquis  or the procedure may be rescheduled. Nothing to eat or drink after midnight prior to your procedure.  Confirmed patient is scheduled for SVT Ablation on Wednesday, July 16 with Dr. Danelle Waddell. Instructed patient to arrive at the Main Entrance A at [facility]: [address] [place], [place] [address] and check in at Admitting at 8:30 AM.  Advised of plan to go home the same day and will only stay overnight if medically necessary. You MUST have a responsible adult to drive you home and MUST be with you the first 24 hours after you arrive home or your procedure could be cancelled.  Patient verbalized understanding to all instructions provided and agreed to proceed with procedure.

## 2023-10-26 ENCOUNTER — Other Ambulatory Visit: Payer: Self-pay | Admitting: Internal Medicine

## 2023-10-26 ENCOUNTER — Other Ambulatory Visit: Payer: Self-pay

## 2023-10-26 MED FILL — Rosuvastatin Calcium Tab 10 MG: ORAL | 90 days supply | Qty: 90 | Fill #0 | Status: AC

## 2023-10-30 MED FILL — Cyclobenzaprine HCl Tab 10 MG: ORAL | 90 days supply | Qty: 90 | Fill #1 | Status: AC

## 2023-10-31 NOTE — Pre-Procedure Instructions (Signed)
 Instructed patient on the following items: Arrival time 0615 Nothing to eat or drink after midnight No meds AM of procedure Responsible person to drive you home and stay with you for 24 hrs  Have you missed any doses of anti-coagulant Eliquis- takes twice a day, hasn't missed any doses.  Don't take dose morning of procedure.

## 2023-11-01 ENCOUNTER — Ambulatory Visit (HOSPITAL_COMMUNITY): Payer: Self-pay | Admitting: Anesthesiology

## 2023-11-01 ENCOUNTER — Ambulatory Visit (HOSPITAL_COMMUNITY)
Admission: RE | Admit: 2023-11-01 | Discharge: 2023-11-01 | Disposition: A | Discharge status: Home / Self Care | Attending: Internal Medicine | Admitting: Internal Medicine

## 2023-11-01 ENCOUNTER — Ambulatory Visit (HOSPITAL_COMMUNITY)
Admission: RE | Disposition: A | Discharge status: Home / Self Care | Payer: Self-pay | Source: Home / Self Care | Attending: Internal Medicine

## 2023-11-01 ENCOUNTER — Encounter (HOSPITAL_COMMUNITY): Payer: Self-pay | Admitting: Internal Medicine

## 2023-11-01 ENCOUNTER — Other Ambulatory Visit: Payer: Self-pay

## 2023-11-01 DIAGNOSIS — Z79899 Other long term (current) drug therapy: Secondary | ICD-10-CM | POA: Insufficient documentation

## 2023-11-01 DIAGNOSIS — I471 Supraventricular tachycardia, unspecified: Secondary | ICD-10-CM

## 2023-11-01 DIAGNOSIS — Z7901 Long term (current) use of anticoagulants: Secondary | ICD-10-CM | POA: Diagnosis not present

## 2023-11-01 DIAGNOSIS — Z79890 Hormone replacement therapy (postmenopausal): Secondary | ICD-10-CM | POA: Diagnosis not present

## 2023-11-01 DIAGNOSIS — I10 Essential (primary) hypertension: Secondary | ICD-10-CM

## 2023-11-01 DIAGNOSIS — G4730 Sleep apnea, unspecified: Secondary | ICD-10-CM | POA: Insufficient documentation

## 2023-11-01 DIAGNOSIS — E039 Hypothyroidism, unspecified: Secondary | ICD-10-CM | POA: Diagnosis not present

## 2023-11-01 DIAGNOSIS — Z6841 Body Mass Index (BMI) 40.0 and over, adult: Secondary | ICD-10-CM | POA: Diagnosis not present

## 2023-11-01 DIAGNOSIS — K219 Gastro-esophageal reflux disease without esophagitis: Secondary | ICD-10-CM | POA: Diagnosis not present

## 2023-11-01 DIAGNOSIS — E119 Type 2 diabetes mellitus without complications: Secondary | ICD-10-CM | POA: Diagnosis not present

## 2023-11-01 DIAGNOSIS — E669 Obesity, unspecified: Secondary | ICD-10-CM | POA: Diagnosis not present

## 2023-11-01 DIAGNOSIS — Z7985 Long-term (current) use of injectable non-insulin antidiabetic drugs: Secondary | ICD-10-CM | POA: Insufficient documentation

## 2023-11-01 HISTORY — PX: SVT ABLATION: EP1225

## 2023-11-01 LAB — GLUCOSE, CAPILLARY
Glucose-Capillary: 162 mg/dL — ABNORMAL HIGH (ref 70–99)
Glucose-Capillary: 128 mg/dL — ABNORMAL HIGH (ref 70–99)

## 2023-11-01 SURGERY — SVT ABLATION
Anesthesia: Monitor Anesthesia Care

## 2023-11-01 MED ORDER — SODIUM CHLORIDE 0.9 % IV SOLN: 250.0000 mL | INTRAVENOUS | Status: DC | PRN

## 2023-11-01 MED ORDER — BUPIVACAINE HCL (PF) 0.25 % IJ SOLN
INTRAMUSCULAR | Status: AC
  Filled 2023-11-01: qty 30

## 2023-11-01 MED ORDER — FENTANYL CITRATE (PF) 250 MCG/5ML IJ SOLN
INTRAMUSCULAR | Status: DC | PRN
  Administered 2023-11-01 (×2): 25 ug via INTRAVENOUS

## 2023-11-01 MED ORDER — PROPOFOL 500 MG/50ML IV EMUL
INTRAVENOUS | Status: DC | PRN
  Administered 2023-11-01: 60 ug/kg/min via INTRAVENOUS

## 2023-11-01 MED ORDER — FENTANYL CITRATE (PF) 100 MCG/2ML IJ SOLN
INTRAMUSCULAR | Status: AC
  Filled 2023-11-01: qty 2

## 2023-11-01 MED ORDER — MIDAZOLAM HCL 2 MG/2ML IJ SOLN
INTRAMUSCULAR | Status: DC | PRN
  Administered 2023-11-01: 2 mg via INTRAVENOUS

## 2023-11-01 MED ORDER — SODIUM CHLORIDE 0.9 % IV SOLN
INTRAVENOUS | Status: DC
Start: 2023-11-01 — End: 2023-11-01

## 2023-11-01 MED ORDER — ONDANSETRON HCL 4 MG/2ML IJ SOLN
INTRAMUSCULAR | Status: DC | PRN
  Administered 2023-11-01: 4 mg via INTRAVENOUS

## 2023-11-01 MED ORDER — HEPARIN (PORCINE) IN NACL 1000-0.9 UT/500ML-% IV SOLN
INTRAVENOUS | Status: DC | PRN
  Administered 2023-11-01: 500 mL

## 2023-11-01 MED ORDER — PROPOFOL 10 MG/ML IV BOLUS
INTRAVENOUS | Status: DC | PRN
  Administered 2023-11-01 (×2): 20 mg via INTRAVENOUS
  Administered 2023-11-01: 10 mg via INTRAVENOUS
  Administered 2023-11-01: 20 mg via INTRAVENOUS

## 2023-11-01 MED ORDER — MIDAZOLAM HCL 2 MG/2ML IJ SOLN
INTRAMUSCULAR | Status: AC
  Filled 2023-11-01: qty 2

## 2023-11-01 MED ORDER — SODIUM CHLORIDE 0.9% FLUSH: 3.0000 mL | INTRAVENOUS | Status: DC | PRN

## 2023-11-01 MED ORDER — ACETAMINOPHEN 325 MG PO TABS: 650.0000 mg | ORAL_TABLET | ORAL | Status: DC | PRN

## 2023-11-01 MED ORDER — LIDOCAINE 2% (20 MG/ML) 5 ML SYRINGE
INTRAMUSCULAR | Status: DC | PRN
  Administered 2023-11-01: 100 mg via INTRAVENOUS

## 2023-11-01 MED ORDER — SODIUM CHLORIDE 0.9% FLUSH: 3.0000 mL | Freq: Two times a day (BID) | INTRAVENOUS | Status: DC

## 2023-11-01 MED ORDER — BUPIVACAINE HCL (PF) 0.25 % IJ SOLN
INTRAMUSCULAR | Status: DC | PRN
Start: 2023-11-01 — End: 2023-11-01
  Administered 2023-11-01: 25 mL

## 2023-11-01 MED ORDER — ONDANSETRON HCL 4 MG/2ML IJ SOLN: 4.0000 mg | Freq: Four times a day (QID) | INTRAMUSCULAR | Status: DC | PRN

## 2023-11-01 SURGICAL SUPPLY — 12 items
BAG SNAP BAND KOVER 36X36 (MISCELLANEOUS) IMPLANT
CATH CRD2 QUAD 6FR 5 (CATHETERS) IMPLANT
CATH EZ STEER NAV 4MM D-F CUR (ABLATOR) IMPLANT
CATH JOSEPH QUAD ALLRED 6F REP (CATHETERS) IMPLANT
CATH POLARIS X REPROCESSED (CATHETERS) IMPLANT
MAT PREVALON FULL STRYKER (MISCELLANEOUS) IMPLANT
PACK EP LF (CUSTOM PROCEDURE TRAY) ×1 IMPLANT
PAD DEFIB RADIO PHYSIO CONN (PAD) ×1 IMPLANT
PATCH CARTO3 (PAD) IMPLANT
SHEATH PINNACLE 6F 10CM (SHEATH) IMPLANT
SHEATH PINNACLE 7F 10CM (SHEATH) IMPLANT
SHEATH PINNACLE 8F 10CM (SHEATH) IMPLANT

## 2023-11-01 NOTE — Progress Notes (Signed)
 Site area:right femoral venous sheaths X3 Site Prior to Removal:  Level 0 Pressure Applied For: 25 minutes Manual:   Yes Patient Status During Pull:  stable Post Pull Site:  Level 0 Post Pull Instructions Given:  yes Post Pull Pulses Present: rt pt palpable Dressing Applied:  gauze and tegaderm Bedrest begins @ 1015 Comments:

## 2023-11-01 NOTE — Transfer of Care (Signed)
 Immediate Anesthesia Transfer of Care Note  Patient: Tiffany Wells  Procedure(s) Performed: SVT ABLATION  Patient Location: Cath Lab  Anesthesia Type:MAC  Level of Consciousness: Awake, alert  Airway & Oxygen Therapy: Patient Spontanous Breathing  Post-op Assessment: Report given to RN, Post -op Vital signs reviewed and stable, Patient moving all extremities, Patient moving all extremities X 4, and Patient able to stick tongue midline  Post vital signs: Reviewed and stable  Last Vitals:  Vitals Value Taken Time  BP 127/71 11/01/23 09:55  Temp 36.5 C 11/01/23 09:43  Pulse 112 11/01/23 09:56  Resp 16 11/01/23 09:56  SpO2 100 % 11/01/23 09:56  Vitals shown include unfiled device data.  Last Pain:  Vitals:   11/01/23 0943  TempSrc: Oral  PainSc: 2          Complications: There were no known notable events for this encounter.

## 2023-11-01 NOTE — H&P (Signed)
 HPI Tiffany Wells returns today after an over 6 year absence from our EP clinic. She is a very pleasant 68 yo woman with a long h/o palpitations and SVT. She recently wore a cardiac monitor which showed runs of SVT up to 180/min. They stop and start. Unclear if due to a re-entrant mechanism or automatic. I saw her with the same problem 6 years ago. She had been fairly well controlled but has had worsening symptoms over the past several months, associated with COVID. She also has a h/o sleep apnea but has not been on CPAP. She is working on this. She has not had syncope. She has an Apple watch which reports afib though I suspect not. She was started on Eliquis  because of the Apple watch findings. Her Zio did not show afib.  Allergies       Allergies  Allergen Reactions   Codeine  Itching      REACTION: Itching   Codeine  Sulfate        REACTION: Itching   Glucophage  Xr [Metformin ]        Uncontrolled diarrhea   Sulfa  Antibiotics        Facial burning   Tape Rash      Adhesive tape, silicones. Tears skin.                Current Outpatient Medications  Medication Sig Dispense Refill   ALPRAZolam  (XANAX ) 0.25 MG tablet Take 1 tablet (0.25 mg total) by mouth 2 (two) times daily as needed for anxiety. 20 tablet 0   clindamycin  (CLEOCIN  T) 1 % external solution Apply to affected sites on scalp twice a day until clear, then as needed 30 mL 2   cyclobenzaprine  (FLEXERIL ) 10 MG tablet Take 1 tablet (10 mg total) by mouth at bedtime. 90 tablet 1   diltiazem  (CARDIZEM  CD) 240 MG 24 hr capsule Take 1 capsule (240 mg total) by mouth daily. 90 capsule 3   diltiazem  (CARDIZEM ) 30 MG tablet Take 1 tablet (30 mg total) by mouth daily as needed (SVT). 30 tablet 3   diphenhydrAMINE (BENADRYL) 25 MG tablet As needed for allergies.       diphenoxylate -atropine  (LOMOTIL ) 2.5-0.025 MG tablet TAKE ONE TABLET BY MOUTH AS NEEDED 30 tablet 2   estradiol  (ESTRACE ) 0.1 MG/GM vaginal cream PLACE 1/4  APPLICATORFUL VAGINALLY DAILY. 42.5 g 9   fluconazole  (DIFLUCAN ) 150 MG tablet Take 1 tablet (150 mg total) by mouth daily for 2 days. 2 tablet 2   hyoscyamine  (LEVSIN  SL) 0.125 MG SL tablet PLACE 1 TABLET UNDER THE TONGUE EVERY 4 HOURS AS NEEDED FOR CRAMPING. 60 tablet 5   levothyroxine  (SYNTHROID ) 50 MCG tablet TAKE 1 TABLET BY MOUTH DAILY BEFORE BREAKFAST 90 tablet 3   loratadine (CLARITIN) 10 MG tablet Take 10 mg by mouth daily.       metoprolol  succinate (TOPROL -XL) 25 MG 24 hr tablet Take 25 mg by mouth in the morning and at bedtime.       nystatin -triamcinolone  ointment (MYCOLOG) Apply 1 Application topically 2 (two) times daily. 30 g 2   omeprazole (PRILOSEC OTC) 20 MG tablet Take 20 mg by mouth daily.       ondansetron  (ZOFRAN -ODT) 4 MG disintegrating tablet Take 1 tablet (4 mg total) by mouth every 8 (eight) hours as needed for nausea or vomiting. 20 tablet 0   promethazine  (PHENERGAN ) 12.5 MG tablet Take 1 tablet (12.5 mg total) by mouth every 8 (eight) hours  as needed for nausea or vomiting. 20 tablet 0   rosuvastatin  (CRESTOR ) 10 MG tablet Take 1 tablet (10 mg total) by mouth daily. 90 tablet 1   Semaglutide ,0.25 or 0.5MG /DOS, (OZEMPIC , 0.25 OR 0.5 MG/DOSE,) 2 MG/3ML SOPN Inject 0.5 mg into the skin once a week. 9 mL 0   apixaban  (ELIQUIS ) 5 MG TABS tablet Take 1 tablet (5 mg total) by mouth 2 (two) times daily. 60 tablet 2      No current facility-administered medications for this visit.              Past Medical History:  Diagnosis Date   Acute pulmonary embolism (HCC) 08/06/2013    Diagnosed during hospitalization for TKR.  Patient received heparin  followed by Xarelto  whic hwill continue for a total of 6 months.   [facility] has recommended IVC placement prior to next TKR and bridging with Lovenox (June 17  Schnier)     Arthritis      osteoarthritis of knee   Back pain     Diabetes mellitus without complication (HCC)     DVT (deep venous thrombosis) (HCC) 12/28/2016    Dysrhythmia 12/28/2016    atrial fibrillation, SVT   Headache      Migraines   History of Salmonella gastroenteritis 2012   Hypertension     IBS (irritable bowel syndrome)     Internal hemorrhoids     Ischemic colitis (HCC) 02/21/2014   Normal cardiac stress test      2006, nromal stress cardiolite, EF 65%   Pulmonary embolism (HCC)      post knee surgery   Vertigo      few episodes          ROS:    All systems reviewed and negative except as noted in the HPI.          Past Surgical History:  Procedure Laterality Date   CATARACT EXTRACTION W/PHACO Left 02/14/2023    Procedure: CATARACT EXTRACTION PHACO AND INTRAOCULAR LENS PLACEMENT (IOC) LEFT CLAREON VIVITY TORIC DIABETIC;  Surgeon: Jaye Fallow, MD;  Location: [facility];  Service: Ophthalmology;  Laterality: Left;  8.26 0:49.3   CATARACT EXTRACTION W/PHACO Right 02/20/2023    Procedure: CATARACT EXTRACTION PHACO AND INTRAOCULAR LENS PLACEMENT (IOC) RIGHT CLAREON VIVITY TORIC  DIABETIC 4.68 00:36.9;  Surgeon: Jaye Fallow, MD;  Location: [facility];  Service: Ophthalmology;  Laterality: Right;   COLONOSCOPY   03/07/2011    Int Hemms   COLONOSCOPY N/A 08/24/2022    Procedure: COLONOSCOPY;  Surgeon: Toledo, Ladell POUR, MD;  Location: [facility] ENDOSCOPY;  Service: Gastroenterology;  Laterality: N/A;   COLONOSCOPY WITH PROPOFOL  N/A 02/20/2017    Procedure: COLONOSCOPY WITH PROPOFOL ;  Surgeon: Viktoria Lamar DASEN, MD;  Location: [facility] ENDOSCOPY;  Service: Endoscopy;  Laterality: N/A;   HYSTEROSCOPY       JOINT REPLACEMENT        Bilateral TKR   ROTATOR CUFF REPAIR       TUBAL LIGATION       UPPER GASTROINTESTINAL ENDOSCOPY   03/07/2011    HH Multip Gastric Polyps/ Dr Viktoria                 Family History  Problem Relation Age of Onset   Hypertension Mother     Diabetes Mother     Cancer Mother          glioma   Hypertension Father     Heart disease Father 86  CAD with CABG   Cancer  Father          multiple myeloma   Arthritis Sister     Hypertension Sister     Cancer Brother          Pre-cancerous lip lesion, testicular CA    Diabetes Daughter     Cancer Maternal Grandmother          Lung Ca, nonsmoker             Social History         Socioeconomic History   Marital status: Married      Spouse name: Not on file   Number of children: 2   Years of education: Not on file   Highest education level: Bachelor's degree (e.g., BA, AB, BS)  Occupational History   Occupation: Print production planner, [employer]  Tobacco Use   Smoking status: Never   Smokeless tobacco: Never  Vaping Use   Vaping status: Never Used  Substance and Sexual Activity   Alcohol use: Not Currently      Alcohol/week: 2.0 standard drinks of alcohol      Types: 2 Glasses of wine per week      Comment: very rare   Drug use: No   Sexual activity: Not on file  Other Topics Concern   Not on file  Social History Narrative    Lives with husband.    Social Drivers of Acupuncturist Strain: Low Risk  (04/03/2023)    Overall Financial Resource Strain (CARDIA)     Difficulty of Paying Living Expenses: Not hard at all  Food Insecurity: No Food Insecurity (04/03/2023)    Hunger Vital Sign     Worried About Running Out of Food in the Last Year: Never true     Ran Out of Food in the Last Year: Never true  Transportation Needs: No Transportation Needs (04/03/2023)    PRAPARE - Therapist, art (Medical): No     Lack of Transportation (Non-Medical): No  Physical Activity: Insufficiently Active (04/03/2023)    Exercise Vital Sign     Days of Exercise per Week: 1 day     Minutes of Exercise per Session: 20 min  Stress: No Stress Concern Present (04/03/2023)    [employer] of Occupational Health - Occupational Stress Questionnaire     Feeling of Stress : Only a little  Social Connections: Socially Integrated (04/03/2023)    Social  Connection and Isolation Panel [NHANES]     Frequency of Communication with Friends and Family: Three times a week     Frequency of Social Gatherings with Friends and Family: Once a week     Attends Religious Services: More than 4 times per year     Active Member of [employer] or Organizations: Yes     Attends Banker Meetings: 1 to 4 times per year     Marital Status: Married  Catering manager Violence: Not At Risk (05/31/2021)    Humiliation, Afraid, Rape, and Kick questionnaire     Fear of Current or Ex-Partner: No     Emotionally Abused: No     Physically Abused: No     Sexually Abused: No        BP 106/62   Pulse (!) 103   Ht 5' 8 (1.727 m)   Wt 277 lb (125.6 kg)   SpO2 98%   BMI 42.12 kg/m  Physical Exam:   Well appearing overweight woman, NAD HEENT: Unremarkable Neck:  No JVD, no thyromegally Lymphatics:  No adenopathy Back:  No CVA tenderness Lungs:  Clear with no wheezes HEART:  Regular rate rhythm, no murmurs, no rubs, no clicks Abd:  soft, positive bowel sounds, no organomegally, no rebound, no guarding Ext:  2 plus pulses, no edema, no cyanosis, no clubbing Skin:  No rashes no nodules Neuro:  CN II through XII intact, motor grossly intact   EKG - sinus tachycardia   Assess/Plan: SVT - she has had recurrent symptoms despite medical therapy. I offered her catheter ablation and she will call us  if she wishes to proceed. Obesity -I encouraged continued Ozempic .    Danelle Waddell COME

## 2023-11-01 NOTE — Discharge Instructions (Signed)

## 2023-11-01 NOTE — Anesthesia Preprocedure Evaluation (Addendum)
 Anesthesia Evaluation  Patient identified by MRN, date of birth, ID band Patient awake    Reviewed: Allergy & Precautions, NPO status , Patient's Chart, lab work & pertinent test results, reviewed documented beta blocker date and time   Airway Mallampati: I  TM Distance: >3 FB Neck ROM: Full    Dental  (+) Teeth Intact, Dental Advisory Given   Pulmonary    breath sounds clear to auscultation       Cardiovascular hypertension, Pt. on home beta blockers + dysrhythmias Supra Ventricular Tachycardia  Rhythm:Regular Rate:Normal  Echo (2019):  Left ventricle: Wall thickness was increased in a pattern of mild    LVH. Systolic function was normal. The estimated ejection    fraction was in the range of 60% to 65%. Doppler parameters are    consistent with abnormal left ventricular relaxation (grade 1    diastolic dysfunction).  - Aortic valve: Valve area (VTI): 2.37 cm^2. Valve area (Vmax):    2.61 cm^2. Valve area (Vmean): 2.22 cm^2.  - Mitral valve: Valve area by continuity equation (using LVOT    flow): 3.04 cm^2.  - Right ventricle: The cavity size was mildly dilated.     Neuro/Psych  Headaches  Neuromuscular disease  negative psych ROS   GI/Hepatic Neg liver ROS, hiatal hernia,GERD  Medicated,,  Endo/Other  diabetes, Type 2Hypothyroidism    Renal/GU negative Renal ROS     Musculoskeletal  (+) Arthritis ,    Abdominal   Peds  Hematology negative hematology ROS (+)   Anesthesia Other Findings   Reproductive/Obstetrics                              Anesthesia Physical Anesthesia Plan  ASA: 3  Anesthesia Plan: MAC   Post-op Pain Management: Minimal or no pain anticipated   Induction: Intravenous  PONV Risk Score and Plan: 2 and Ondansetron , Treatment may vary due to age or medical condition and Propofol  infusion  Airway Management Planned: Natural Airway, Nasal Cannula and Simple Face  Mask  Additional Equipment: None  Intra-op Plan:   Post-operative Plan: Extubation in OR  Informed Consent: I have reviewed the patients History and Physical, chart, labs and discussed the procedure including the risks, benefits and alternatives for the proposed anesthesia with the patient or authorized representative who has indicated his/her understanding and acceptance.       Plan Discussed with: CRNA  Anesthesia Plan Comments:          Anesthesia Quick Evaluation

## 2023-11-01 NOTE — Anesthesia Postprocedure Evaluation (Signed)
 Anesthesia Post Note  Patient: Tiffany Wells  Procedure(s) Performed: SVT ABLATION     Patient location during evaluation: PACU Anesthesia Type: MAC Level of consciousness: awake and alert Pain management: pain level controlled Vital Signs Assessment: post-procedure vital signs reviewed and stable Respiratory status: spontaneous breathing, nonlabored ventilation, respiratory function stable and patient connected to nasal cannula oxygen Cardiovascular status: stable and blood pressure returned to baseline Postop Assessment: no apparent nausea or vomiting Anesthetic complications: no   There were no known notable events for this encounter.  Last Vitals:  Vitals:   11/01/23 1145 11/01/23 1200  BP:  104/84  Pulse: (!) 124 (!) 123  Resp: 18 16  Temp:    SpO2: 97% 97%    Last Pain:  Vitals:   11/01/23 1033  TempSrc:   PainSc: 0-No pain   Pain Goal:                   [name]

## 2023-11-02 ENCOUNTER — Telehealth (HOSPITAL_COMMUNITY): Payer: Self-pay

## 2023-11-02 MED FILL — Fentanyl Citrate Preservative Free (PF) Inj 100 MCG/2ML: INTRAMUSCULAR | Qty: 1 | Status: AC

## 2023-11-02 NOTE — Telephone Encounter (Signed)
 Attempted to reach patient to follow up with procedure completed on 11/01/23, no answer. Left VM for patient to return call.

## 2023-11-02 NOTE — Telephone Encounter (Signed)
 Spoke with patient to complete post procedure follow up call.  Patient reports no complications with groin sites.   Instructions reviewed with patient:  Remove large bandage at puncture site after 24 hours. It is normal to have bruising, tenderness, mild swelling, and a pea or marble sized lump/knot at the groin site which can take up to three months to resolve.  Get help right away if you notice sudden swelling at the puncture site.  Check your puncture site every day for signs of infection: fever, redness, swelling, pus drainage, warmth, foul odor or excessive pain. If this occurs, please call the office at [phone], to speak with the nurse. Get help right away if your puncture site is bleeding and the bleeding does not stop after applying firm pressure to the area.  You may continue to have skipped beats during the first several months after your procedure.  It is very important not to miss any doses of your blood thinner Eliquis .   You will follow up with the APP on 12/08/23.    Patient verbalized understanding to all instructions provided.

## 2023-11-04 MED FILL — Fluconazole Tab 150 MG: ORAL | 2 days supply | Qty: 2 | Fill #1 | Status: AC

## 2023-11-04 MED FILL — Rosuvastatin Calcium Tab 10 MG: ORAL | 90 days supply | Qty: 90 | Fill #1 | Status: CN

## 2023-11-04 MED FILL — Estradiol Vaginal Cream 0.01%: VAGINAL | 90 days supply | Qty: 42.5 | Fill #1 | Status: AC

## 2023-11-05 ENCOUNTER — Other Ambulatory Visit: Payer: Self-pay

## 2023-11-06 ENCOUNTER — Other Ambulatory Visit: Payer: Self-pay

## 2023-12-05 ENCOUNTER — Ambulatory Visit: Admitting: Pulmonary Disease

## 2023-12-07 NOTE — Progress Notes (Unsigned)
 Electrophysiology Clinic Note    Date:  12/08/2023  Patient ID:  Tiffany Wells, DOB December 04, 1955, MRN [id number] PCP:  Marylynn Verneita CROME, MD  Cardiologist:  None   Electrophysiologist:  Danelle Birmingham, MD     Discussed the use of AI scribe software for clinical note transcription with the patient, who gave verbal consent to proceed.   Patient Profile    Chief Complaint: SVT follow-up  History of Present Illness: Tiffany Wells is a 68 y.o. female with PMH notable for SVT, OSA, T2DM, HTN, ; seen today for Danelle Birmingham, MD for routine electrophysiology follow-up s/p SVT Ablation.  She is s/p ablation of AT on 11/01/2023 by Dr. Birmingham.  On follow-up today, she is doing great. She has had no further SVT episodes since ablation. She recently was outside gardening in the heat and noticed her HR rose to 120-130 range, and she went inside to cool off and HR appropriately lower to her typical 80-90s range.  She has no concerns related to her groin access site, denies pain, swelling, bruising. She questions whether her CCB and/or BB may be stopped.   Arrhythmia/Device History No specialty comments available.    ROS:  Please see the history of present illness. All other systems are reviewed and otherwise negative.    Physical Exam    VS:  BP 104/60 (BP Location: Left Arm, Patient Position: Sitting, Cuff Size: Normal)   Pulse (!) 101   Ht 5' 8 (1.727 m)   Wt 274 lb 12.8 oz (124.6 kg)   SpO2 98%   BMI 41.78 kg/m  BMI: Body mass index is 41.78 kg/m.      Wt Readings from Last 3 Encounters:  12/08/23 274 lb 12.8 oz (124.6 kg)  11/01/23 271 lb (122.9 kg)  10/12/23 278 lb 6.4 oz (126.3 kg)     GEN- The patient is well appearing, alert and oriented x 3 today.   Lungs- Clear to ausculation bilaterally, normal work of breathing.  Heart- Regular rate and rhythm, no murmurs, rubs or gallops Extremities- No peripheral edema, warm, dry   Studies Reviewed   Previous EP,  cardiology notes.    EKG is ordered. Personal review of EKG from today shows:          Long term monitor, 08/08/2023 HR 36 - 182, average 96 bpm. 1922 SVT episodes, longest 13 min 22 seconds with an average rate of 153 bpm. Wenckebach present. Rare supraventricular and ventricular ectopy. No atrial fibrillation. Symptom trigger episodes correspond to SVT.  TTE, 05/25/2017 - Left ventricle: Wall thickness was increased in a pattern of mild    LVH. Systolic function was normal. The estimated ejection    fraction was in the range of 60% to 65%. Doppler parameters are    consistent with abnormal left ventricular relaxation (grade 1    diastolic dysfunction).  - Aortic valve: Valve area (VTI): 2.37 cm^2. Valve area (Vmax):    2.61 cm^2. Valve area (Vmean): 2.22 cm^2.  - Mitral valve: Valve area by continuity equation (using LVOT    flow): 3.04 cm^2.  - Right ventricle: The cavity size was mildly dilated.    Assessment and Plan     #) SVT, Atach S/p ablation of Atach No further SVT episodes since ablation Will reduce dilt to 180mg  daily Discussed whether PCP and/or cardiology may manage further reduction. Patient prefers to see PCP which I think is reasonable      Current medicines are reviewed at  length with the patient today.   The patient has concerns regarding her medicines.  The following changes were made today:   REDUCE diltiazem  to 180mg  daily  Labs/ tests ordered today include:  Orders Placed This Encounter  Procedures   EKG 12-Lead     Disposition: Follow up with Dr. Waddell or EP APP PRN    Signed, Chantal Needle, NP  12/08/23  1:32 PM  Electrophysiology [facility]

## 2023-12-08 ENCOUNTER — Encounter: Payer: Self-pay | Admitting: Internal Medicine

## 2023-12-08 ENCOUNTER — Ambulatory Visit: Attending: Cardiology | Admitting: Cardiology

## 2023-12-08 VITALS — BP 104/60 | HR 101 | Ht 68.0 in | Wt 274.8 lb

## 2023-12-08 DIAGNOSIS — I4719 Other supraventricular tachycardia: Secondary | ICD-10-CM | POA: Insufficient documentation

## 2023-12-08 DIAGNOSIS — I471 Supraventricular tachycardia, unspecified: Secondary | ICD-10-CM | POA: Diagnosis present

## 2023-12-08 MED ORDER — DILTIAZEM HCL ER COATED BEADS 180 MG PO CP24: 180.0000 mg | ORAL_CAPSULE | Freq: Every day | ORAL | 3 refills | Status: DC

## 2023-12-08 NOTE — Patient Instructions (Signed)
 Medication Instructions:  Decrease Cardizem  to 180 mg daily   *If you need a refill on your cardiac medications before your next appointment, please call your pharmacy*   Follow-Up: At [facility], you and your health needs are our priority.  As part of our continuing mission to provide you with exceptional heart care, our providers are all part of one team.  This team includes your primary Cardiologist (physician) and Advanced Practice Providers or APPs (Physician Assistants and Nurse Practitioners) who all work together to provide you with the care you need, when you need it.  Your next appointment:   As needed  Provider:   Suzann Riddle, NP    We recommend signing up for the patient portal called MyChart.  Sign up information is provided on this After Visit Summary.  MyChart is used to connect with patients for Virtual Visits (Telemedicine).  Patients are able to view lab/test results, encounter notes, upcoming appointments, etc.  Non-urgent messages can be sent to your provider as well.   To learn more about what you can do with MyChart, go to [email or web address].

## 2023-12-11 ENCOUNTER — Other Ambulatory Visit: Payer: Self-pay | Admitting: Internal Medicine

## 2023-12-13 ENCOUNTER — Ambulatory Visit: Admitting: Internal Medicine

## 2023-12-13 ENCOUNTER — Other Ambulatory Visit: Payer: Self-pay | Admitting: Internal Medicine

## 2023-12-13 DIAGNOSIS — E669 Obesity, unspecified: Secondary | ICD-10-CM

## 2024-01-02 ENCOUNTER — Other Ambulatory Visit: Payer: Self-pay

## 2024-01-02 MED FILL — Rosuvastatin Calcium Tab 10 MG: ORAL | 90 days supply | Qty: 90 | Fill #1 | Status: CN

## 2024-01-12 ENCOUNTER — Other Ambulatory Visit: Payer: Self-pay

## 2024-01-12 MED FILL — Rosuvastatin Calcium Tab 10 MG: ORAL | 90 days supply | Qty: 90 | Fill #1 | Status: AC

## 2024-01-24 ENCOUNTER — Telehealth: Payer: Self-pay

## 2024-01-24 NOTE — Telephone Encounter (Signed)
 I left a voicemail for patient asking her to please call us  back.  I also sent a message to patient via MyChart.  E2C2 - when patient calls back, please assist her with rescheduling her 02/06/2024 appointment with Dr. Verneita Kettering, as she will be out of the office that day.

## 2024-02-06 ENCOUNTER — Ambulatory Visit: Admitting: Internal Medicine

## 2024-02-08 ENCOUNTER — Other Ambulatory Visit: Payer: Self-pay | Admitting: Internal Medicine

## 2024-02-08 ENCOUNTER — Other Ambulatory Visit: Payer: Self-pay

## 2024-02-08 MED ORDER — CYCLOBENZAPRINE HCL 10 MG PO TABS
10.0000 mg | ORAL_TABLET | Freq: Every day | ORAL | 1 refills | Status: AC
  Filled 2024-02-08: qty 90, 90d supply, fill #0
  Filled 2024-03-16 – 2024-04-26 (×2): qty 90, 90d supply, fill #1

## 2024-02-10 ENCOUNTER — Other Ambulatory Visit: Payer: Self-pay | Admitting: Internal Medicine

## 2024-02-10 DIAGNOSIS — E119 Type 2 diabetes mellitus without complications: Secondary | ICD-10-CM

## 2024-02-14 ENCOUNTER — Other Ambulatory Visit: Payer: Self-pay | Admitting: Internal Medicine

## 2024-02-14 DIAGNOSIS — I471 Supraventricular tachycardia, unspecified: Secondary | ICD-10-CM

## 2024-02-27 ENCOUNTER — Encounter: Payer: Self-pay | Admitting: Internal Medicine

## 2024-02-27 ENCOUNTER — Ambulatory Visit: Admitting: Internal Medicine

## 2024-02-27 VITALS — BP 96/66 | HR 110 | Ht 68.0 in | Wt 273.6 lb

## 2024-02-27 DIAGNOSIS — E669 Obesity, unspecified: Secondary | ICD-10-CM | POA: Diagnosis not present

## 2024-02-27 DIAGNOSIS — Z23 Encounter for immunization: Secondary | ICD-10-CM | POA: Diagnosis not present

## 2024-02-27 DIAGNOSIS — E785 Hyperlipidemia, unspecified: Secondary | ICD-10-CM

## 2024-02-27 DIAGNOSIS — I10 Essential (primary) hypertension: Secondary | ICD-10-CM

## 2024-02-27 DIAGNOSIS — E1169 Type 2 diabetes mellitus with other specified complication: Secondary | ICD-10-CM | POA: Diagnosis not present

## 2024-02-27 DIAGNOSIS — Z6841 Body Mass Index (BMI) 40.0 and over, adult: Secondary | ICD-10-CM

## 2024-02-27 DIAGNOSIS — R5383 Other fatigue: Secondary | ICD-10-CM | POA: Diagnosis not present

## 2024-02-27 DIAGNOSIS — Z86718 Personal history of other venous thrombosis and embolism: Secondary | ICD-10-CM

## 2024-02-27 DIAGNOSIS — E039 Hypothyroidism, unspecified: Secondary | ICD-10-CM

## 2024-02-27 DIAGNOSIS — I471 Supraventricular tachycardia, unspecified: Secondary | ICD-10-CM

## 2024-02-27 DIAGNOSIS — E119 Type 2 diabetes mellitus without complications: Secondary | ICD-10-CM

## 2024-02-27 MED ORDER — ROSUVASTATIN CALCIUM 10 MG PO TABS
10.0000 mg | ORAL_TABLET | Freq: Every day | ORAL | 1 refills | Status: AC
Start: 2024-02-27 — End: ?

## 2024-02-27 MED ORDER — METOPROLOL SUCCINATE ER 25 MG PO TB24: 12.5000 mg | ORAL_TABLET | Freq: Two times a day (BID) | ORAL | 3 refills | Status: AC

## 2024-02-27 MED ORDER — LEVOTHYROXINE SODIUM 50 MCG PO TABS: ORAL_TABLET | ORAL | 1 refills | Status: AC

## 2024-02-27 NOTE — Patient Instructions (Signed)
 Let's try reducing your metoprolol  dose to 12.5 mg twice daily.  Rx for 25 mg dose sent to [facility]   Influenza vaccine given today

## 2024-02-27 NOTE — Progress Notes (Unsigned)
 Subjective:  Patient ID: Tiffany Wells, female    DOB: 09/16/1955  Age: 68 y.o. MRN: [id number]  CC: The primary encounter diagnosis was Obesity, diabetes, and hypertension syndrome (HCC). Diagnoses of Acquired hypothyroidism, Hyperlipidemia, unspecified hyperlipidemia type, and Other fatigue were also pertinent to this visit.   HPI KYNSLEIGH WESTENDORF presents for  Chief Complaint  Patient presents with   Medical Management of Chronic Issues   1) SVT : s/p ablation in July   ,  off of Eliquis , resting pulse 80-90 with reduced dose of diltiazem  .  Resting bp has been dropping below 90 systolic  low . Taking 25 mg metoprolol  XR BID   2) Type 2 DM:  taking ozempic  .  Bowels moving regularly,  no episodes of colitis since August and taking only ozempic  0.5 mg weekly   Outpatient Medications Prior to Visit  Medication Sig Dispense Refill   ALPRAZolam  (XANAX ) 0.25 MG tablet Take 1 tablet (0.25 mg total) by mouth 2 (two) times daily as needed for anxiety. 20 tablet 0   clindamycin  (CLEOCIN  T) 1 % external solution Apply to affected sites on scalp twice a day until clear, then as needed 30 mL 2   cyclobenzaprine  (FLEXERIL ) 10 MG tablet Take 1 tablet (10 mg total) by mouth at bedtime. 90 tablet 1   diltiazem  (CARDIZEM  CD) 180 MG 24 hr capsule Take 1 capsule (180 mg total) by mouth daily. 30 capsule 3   diphenhydrAMINE (BENADRYL) 25 MG tablet Take 50 mg by mouth every evening. As needed for allergies.     diphenoxylate -atropine  (LOMOTIL ) 2.5-0.025 MG tablet TAKE ONE TABLET BY MOUTH AS NEEDED 30 tablet 2   estradiol  (ESTRACE ) 0.1 MG/GM vaginal cream PLACE 1/4 APPLICATORFUL VAGINALLY DAILY. 42.5 g 9   hyoscyamine  (LEVSIN  SL) 0.125 MG SL tablet PLACE 1 TABLET UNDER THE TONGUE EVERY 4 HOURS AS NEEDED FOR CRAMPING. 60 tablet 5   levothyroxine  (SYNTHROID ) 50 MCG tablet TAKE 1 TABLET BY MOUTH EVERY DAY BEFORE BREAKFAST 90 tablet 1   loratadine (CLARITIN) 10 MG tablet Take 10 mg by mouth daily as needed  for allergies.     metoprolol  succinate (TOPROL -XL) 50 MG 24 hr tablet Take 25 mg by mouth in the morning and at bedtime.     nystatin -triamcinolone  ointment (MYCOLOG) Apply 1 Application topically 2 (two) times daily. (Patient taking differently: Apply 1 Application topically 2 (two) times daily as needed (irritation).) 30 g 2   omeprazole (PRILOSEC OTC) 20 MG tablet Take 20 mg by mouth every evening.     rosuvastatin  (CRESTOR ) 10 MG tablet Take 1 tablet (10 mg total) by mouth daily. 90 tablet 1   Semaglutide ,0.25 or 0.5MG /DOS, (OZEMPIC , 0.25 OR 0.5 MG/DOSE,) 2 MG/3ML SOPN INJECT 0.5 MG INTO THE SKIN ONE TIME PER WEEK 3 mL 1   fluconazole  (DIFLUCAN ) 150 MG tablet Take 1 tablet (150 mg total) by mouth daily for 2 days. (Patient not taking: Reported on 02/27/2024) 2 tablet 2   diltiazem  (CARDIZEM ) 30 MG tablet Take 1 tablet (30 mg total) by mouth daily as needed (SVT). 30 tablet 3   ELIQUIS  5 MG TABS tablet TAKE 1 TABLET BY MOUTH TWICE A DAY 60 tablet 2   Probiotic Product (PROBIOTIC DAILY PO) Take 1 capsule by mouth every evening.     No facility-administered medications prior to visit.    Review of Systems;  Patient denies headache, fevers, malaise, unintentional weight loss, skin rash, eye pain, sinus congestion and sinus pain, sore throat, dysphagia,  hemoptysis , cough, dyspnea, wheezing, chest pain, palpitations, orthopnea, edema, abdominal pain, nausea, melena, diarrhea, constipation, flank pain, dysuria, hematuria, urinary  Frequency, nocturia, numbness, tingling, seizures,  Focal weakness, Loss of consciousness,  Tremor, insomnia, depression, anxiety, and suicidal ideation.      Objective:  BP 96/66   Pulse (!) 110   Ht 5' 8 (1.727 m)   Wt 273 lb 9.6 oz (124.1 kg)   SpO2 97%   BMI 41.60 kg/m   BP Readings from Last 3 Encounters:  02/27/24 96/66  12/08/23 104/60  11/01/23 114/84    Wt Readings from Last 3 Encounters:  02/27/24 273 lb 9.6 oz (124.1 kg)  12/08/23 274 lb  12.8 oz (124.6 kg)  11/01/23 271 lb (122.9 kg)    Physical Exam  Lab Results  Component Value Date   HGBA1C 6.6 (A) 07/11/2023   HGBA1C 6.2 01/06/2023   HGBA1C 6.1 10/04/2022    Lab Results  Component Value Date   CREATININE 0.85 10/17/2023   CREATININE 0.86 06/16/2023   CREATININE 0.96 04/03/2023    Lab Results  Component Value Date   WBC 7.8 10/17/2023   HGB 13.9 10/17/2023   HCT 43.3 10/17/2023   PLT 219 10/17/2023   GLUCOSE 151 (H) 10/17/2023   CHOL 147 01/06/2023   TRIG 171 (H) 01/06/2023   HDL 45 (L) 01/06/2023   LDLDIRECT 100.0 10/04/2022   LDLCALC 75 01/06/2023   ALT 18 06/16/2023   AST 22 06/16/2023   NA 142 10/17/2023   K 4.8 10/17/2023   CL 104 10/17/2023   CREATININE 0.85 10/17/2023   BUN 15 10/17/2023   CO2 22 10/17/2023   TSH 2.570 06/16/2023   INR 1.1 02/13/2014   HGBA1C 6.6 (A) 07/11/2023   MICROALBUR 0.8 07/26/2023    EP STUDY Result Date: 11/01/2023 Conclusion: Successful EP study and catheter ablation of incessant atrial tachycardia originating low in the right atrium near the crista terminalis.  Following catheter ablation there was no inducible SVT. Danelle Birmingham, MD    Assessment & Plan:  .Obesity, diabetes, and hypertension syndrome (HCC)  Acquired hypothyroidism  Hyperlipidemia, unspecified hyperlipidemia type  Other fatigue     I spent 34 minutes on the day of this face to face encounter reviewing patient's  most recent visit with cardiology,  nephrology,  and neurology,  prior relevant surgical and non surgical procedures, recent  labs and imaging studies, counseling on weight management,  reviewing the assessment and plan with patient, and post visit ordering and reviewing of  diagnostics and therapeutics with patient  .   Follow-up: No follow-ups on file.   Verneita LITTIE Kettering, MD

## 2024-02-28 ENCOUNTER — Encounter: Payer: Self-pay | Admitting: Internal Medicine

## 2024-02-28 ENCOUNTER — Ambulatory Visit: Payer: Self-pay | Admitting: Internal Medicine

## 2024-02-28 LAB — CBC WITH DIFFERENTIAL/PLATELET
Basophils Absolute: 0 K/uL (ref 0.0–0.1)
Basophils Relative: 0.4 % (ref 0.0–3.0)
Eosinophils Absolute: 0.2 K/uL (ref 0.0–0.7)
Eosinophils Relative: 2.7 % (ref 0.0–5.0)
HCT: 42.1 % (ref 36.0–46.0)
Hemoglobin: 14.2 g/dL (ref 12.0–15.0)
Lymphocytes Relative: 27.4 % (ref 12.0–46.0)
Lymphs Abs: 2.6 K/uL (ref 0.7–4.0)
MCHC: 33.7 g/dL (ref 30.0–36.0)
MCV: 80.5 fl (ref 78.0–100.0)
Monocytes Absolute: 0.6 K/uL (ref 0.1–1.0)
Monocytes Relative: 6.7 % (ref 3.0–12.0)
Neutro Abs: 5.9 K/uL (ref 1.4–7.7)
Neutrophils Relative %: 62.8 % (ref 43.0–77.0)
Platelets: 223 K/uL (ref 150.0–400.0)
RBC: 5.23 Mil/uL — ABNORMAL HIGH (ref 3.87–5.11)
RDW: 14.7 % (ref 11.5–15.5)
WBC: 9.3 K/uL (ref 4.0–10.5)

## 2024-02-28 LAB — COMPREHENSIVE METABOLIC PANEL WITH GFR
ALT: 10 U/L (ref 0–35)
AST: 13 U/L (ref 0–37)
Albumin: 3.9 g/dL (ref 3.5–5.2)
Alkaline Phosphatase: 77 U/L (ref 39–117)
BUN: 14 mg/dL (ref 6–23)
CO2: 26 meq/L (ref 19–32)
Calcium: 8.6 mg/dL (ref 8.4–10.5)
Chloride: 103 meq/L (ref 96–112)
Creatinine, Ser: 0.76 mg/dL (ref 0.40–1.20)
GFR: 79.43 mL/min (ref >60.00)
Glucose, Bld: 103 mg/dL — ABNORMAL HIGH (ref 70–99)
Potassium: 4 meq/L (ref 3.5–5.1)
Sodium: 138 meq/L (ref 135–145)
Total Bilirubin: 0.4 mg/dL (ref 0.2–1.2)
Total Protein: 6.6 g/dL (ref 6.0–8.3)

## 2024-02-28 LAB — LIPID PANEL
Cholesterol: 143 mg/dL (ref 0–200)
HDL: 42.1 mg/dL (ref >39.00)
LDL Cholesterol: 65 mg/dL (ref 0–99)
NonHDL: 100.44
Total CHOL/HDL Ratio: 3
Triglycerides: 177 mg/dL — ABNORMAL HIGH (ref 0.0–149.0)
VLDL: 35.4 mg/dL (ref 0.0–40.0)

## 2024-02-28 LAB — HEMOGLOBIN A1C: Hgb A1c MFr Bld: 6.3 % (ref 4.6–6.5)

## 2024-02-28 LAB — LDL CHOLESTEROL, DIRECT: Direct LDL: 92 mg/dL

## 2024-02-28 LAB — TSH: TSH: 2.61 u[IU]/mL (ref 0.35–5.50)

## 2024-02-28 NOTE — Assessment & Plan Note (Addendum)
 S/p ablation.  Now on reduced dose of diltiazem  and 25 mg metoprolol  XL bid.  Reducing metoprolol  to 12.5 mg given hypotension

## 2024-02-28 NOTE — Assessment & Plan Note (Addendum)
 Complicated by Type 2 DM. She has lost 31 lbs since 2022.    BMI has improved and diabetes is under excellent control with low dose Ozempic .  LDL is < 70 and she has no microalbuniuria. Continue statin  BP too low for ARB   Lab Results  Component Value Date   HGBA1C 6.3 02/27/2024   Lab Results  Component Value Date   LABMICR See below: 07/04/2017   LABMICR See below: 01/25/2016   MICROALBUR 0.8 07/26/2023   MICROALBUR 0.2 05/24/2019     Lab Results  Component Value Date   CHOL 143 02/27/2024   HDL 42.10 02/27/2024   LDLCALC 65 02/27/2024   LDLDIRECT 92.0 02/27/2024   TRIG 177.0 (H) 02/27/2024   CHOLHDL 3 02/27/2024

## 2024-02-28 NOTE — Assessment & Plan Note (Signed)
 Sheis tolerating  the smallest dose of levothyroxine     No changes to therapy   Lab Results  Component Value Date   TSH 2.61 02/27/2024

## 2024-02-28 NOTE — Assessment & Plan Note (Signed)
 Recommend use of Eliquis  during international flights or any flights > 4 hours.

## 2024-03-16 ENCOUNTER — Other Ambulatory Visit: Payer: Self-pay

## 2024-03-24 ENCOUNTER — Other Ambulatory Visit: Payer: Self-pay | Admitting: Cardiology

## 2024-04-12 ENCOUNTER — Other Ambulatory Visit: Payer: Self-pay | Admitting: Internal Medicine

## 2024-04-12 DIAGNOSIS — E119 Type 2 diabetes mellitus without complications: Secondary | ICD-10-CM

## 2024-08-26 ENCOUNTER — Ambulatory Visit: Admitting: Internal Medicine
